# Patient Record
Sex: Female | Born: 1950 | Race: White | Hispanic: No | Marital: Married | Smoking: Never smoker
Health system: Southern US, Community
[De-identification: ages and names within clinical notes are randomized; demographics above are authoritative.]

## PROBLEM LIST (undated history)

## (undated) DIAGNOSIS — I4892 Unspecified atrial flutter: Secondary | ICD-10-CM

## (undated) DIAGNOSIS — G4752 REM sleep behavior disorder: Secondary | ICD-10-CM

## (undated) DIAGNOSIS — E6609 Other obesity due to excess calories: Secondary | ICD-10-CM

## (undated) DIAGNOSIS — E785 Hyperlipidemia, unspecified: Secondary | ICD-10-CM

## (undated) DIAGNOSIS — G4733 Obstructive sleep apnea (adult) (pediatric): Secondary | ICD-10-CM

## (undated) DIAGNOSIS — J310 Chronic rhinitis: Secondary | ICD-10-CM

## (undated) DIAGNOSIS — I700 Atherosclerosis of aorta: Secondary | ICD-10-CM

## (undated) DIAGNOSIS — M4800 Spinal stenosis, site unspecified: Secondary | ICD-10-CM

## (undated) DIAGNOSIS — R002 Palpitations: Secondary | ICD-10-CM

## (undated) DIAGNOSIS — F329 Major depressive disorder, single episode, unspecified: Secondary | ICD-10-CM

## (undated) DIAGNOSIS — E039 Hypothyroidism, unspecified: Secondary | ICD-10-CM

## (undated) DIAGNOSIS — F32A Anxiety disorder, unspecified: Secondary | ICD-10-CM

## (undated) DIAGNOSIS — G4761 Periodic limb movement disorder: Secondary | ICD-10-CM

## (undated) DIAGNOSIS — M797 Fibromyalgia: Secondary | ICD-10-CM

## (undated) DIAGNOSIS — F419 Anxiety disorder, unspecified: Secondary | ICD-10-CM

## (undated) DIAGNOSIS — R0602 Shortness of breath: Secondary | ICD-10-CM

## (undated) HISTORY — DX: Spinal stenosis, site unspecified: M48.00

## (undated) HISTORY — DX: Anxiety disorder, unspecified: F32.A

## (undated) HISTORY — DX: Anxiety disorder, unspecified: F41.9

## (undated) HISTORY — DX: Hypothyroidism, unspecified: E03.9

## (undated) HISTORY — PX: DILATION AND CURETTAGE OF UTERUS: SHX78

## (undated) HISTORY — DX: Other obesity due to excess calories: E66.09

## (undated) HISTORY — DX: Morbid (severe) obesity due to excess calories: E66.01

## (undated) HISTORY — DX: REM sleep behavior disorder: G47.52

## (undated) HISTORY — DX: Unspecified atrial flutter: I48.92

## (undated) HISTORY — DX: Chronic rhinitis: J31.0

## (undated) HISTORY — PX: OTHER SURGICAL HISTORY: SHX169

## (undated) HISTORY — DX: Periodic limb movement disorder: G47.61

## (undated) HISTORY — PX: CARPAL TUNNEL RELEASE: SHX101

## (undated) HISTORY — DX: Atherosclerosis of aorta: I70.0

## (undated) HISTORY — DX: Shortness of breath: R06.02

## (undated) HISTORY — DX: Fibromyalgia: M79.7

## (undated) HISTORY — DX: Obstructive sleep apnea (adult) (pediatric): G47.33

## (undated) HISTORY — DX: Hyperlipidemia, unspecified: E78.5

## (undated) HISTORY — DX: Major depressive disorder, single episode, unspecified: F32.9

## (undated) HISTORY — DX: Palpitations: R00.2

---

## 1998-01-09 ENCOUNTER — Other Ambulatory Visit: Admission: RE | Admit: 1998-01-09 | Discharge: 1998-01-09 | Payer: Self-pay | Admitting: Obstetrics and Gynecology

## 1998-01-27 ENCOUNTER — Encounter: Payer: Self-pay | Admitting: Obstetrics and Gynecology

## 1998-01-27 ENCOUNTER — Ambulatory Visit (HOSPITAL_COMMUNITY): Admission: RE | Admit: 1998-01-27 | Discharge: 1998-01-27 | Payer: Self-pay | Admitting: Obstetrics and Gynecology

## 1998-09-18 ENCOUNTER — Other Ambulatory Visit: Admission: RE | Admit: 1998-09-18 | Discharge: 1998-09-18 | Payer: Self-pay | Admitting: Obstetrics and Gynecology

## 1999-02-27 ENCOUNTER — Other Ambulatory Visit: Admission: RE | Admit: 1999-02-27 | Discharge: 1999-02-27 | Payer: Self-pay | Admitting: Obstetrics and Gynecology

## 1999-06-19 ENCOUNTER — Ambulatory Visit (HOSPITAL_COMMUNITY): Admission: RE | Admit: 1999-06-19 | Discharge: 1999-06-19 | Payer: Self-pay | Admitting: Specialist

## 1999-06-19 ENCOUNTER — Encounter: Payer: Self-pay | Admitting: Specialist

## 2000-01-29 ENCOUNTER — Encounter: Admission: RE | Admit: 2000-01-29 | Discharge: 2000-01-29 | Payer: Self-pay | Admitting: Obstetrics and Gynecology

## 2000-01-29 ENCOUNTER — Encounter: Payer: Self-pay | Admitting: Obstetrics and Gynecology

## 2000-02-05 ENCOUNTER — Encounter: Admission: RE | Admit: 2000-02-05 | Discharge: 2000-02-05 | Payer: Self-pay | Admitting: Obstetrics and Gynecology

## 2000-02-05 ENCOUNTER — Encounter: Payer: Self-pay | Admitting: Obstetrics and Gynecology

## 2000-03-11 ENCOUNTER — Other Ambulatory Visit: Admission: RE | Admit: 2000-03-11 | Discharge: 2000-03-11 | Payer: Self-pay | Admitting: Obstetrics and Gynecology

## 2000-08-19 ENCOUNTER — Ambulatory Visit (HOSPITAL_COMMUNITY): Admission: RE | Admit: 2000-08-19 | Discharge: 2000-08-19 | Payer: Self-pay | Admitting: Gastroenterology

## 2001-02-27 ENCOUNTER — Encounter: Payer: Self-pay | Admitting: Obstetrics and Gynecology

## 2001-02-27 ENCOUNTER — Encounter: Admission: RE | Admit: 2001-02-27 | Discharge: 2001-02-27 | Payer: Self-pay | Admitting: Obstetrics and Gynecology

## 2001-04-07 ENCOUNTER — Other Ambulatory Visit: Admission: RE | Admit: 2001-04-07 | Discharge: 2001-04-07 | Payer: Self-pay | Admitting: Obstetrics and Gynecology

## 2001-11-21 ENCOUNTER — Ambulatory Visit (HOSPITAL_BASED_OUTPATIENT_CLINIC_OR_DEPARTMENT_OTHER): Admission: RE | Admit: 2001-11-21 | Discharge: 2001-11-21 | Payer: Self-pay | Admitting: Orthopedic Surgery

## 2002-01-30 ENCOUNTER — Ambulatory Visit (HOSPITAL_BASED_OUTPATIENT_CLINIC_OR_DEPARTMENT_OTHER): Admission: RE | Admit: 2002-01-30 | Discharge: 2002-01-30 | Payer: Self-pay | Admitting: Orthopedic Surgery

## 2002-03-02 ENCOUNTER — Encounter: Admission: RE | Admit: 2002-03-02 | Discharge: 2002-03-02 | Payer: Self-pay | Admitting: Obstetrics and Gynecology

## 2002-03-02 ENCOUNTER — Encounter: Payer: Self-pay | Admitting: Obstetrics and Gynecology

## 2002-06-01 ENCOUNTER — Other Ambulatory Visit: Admission: RE | Admit: 2002-06-01 | Discharge: 2002-06-01 | Payer: Self-pay | Admitting: Obstetrics and Gynecology

## 2002-07-09 ENCOUNTER — Ambulatory Visit (HOSPITAL_BASED_OUTPATIENT_CLINIC_OR_DEPARTMENT_OTHER): Admission: RE | Admit: 2002-07-09 | Discharge: 2002-07-09 | Payer: Self-pay | Admitting: Orthopedic Surgery

## 2003-05-17 ENCOUNTER — Encounter: Admission: RE | Admit: 2003-05-17 | Discharge: 2003-05-17 | Payer: Self-pay | Admitting: Obstetrics and Gynecology

## 2003-06-07 ENCOUNTER — Other Ambulatory Visit: Admission: RE | Admit: 2003-06-07 | Discharge: 2003-06-07 | Payer: Self-pay | Admitting: Obstetrics and Gynecology

## 2003-07-26 ENCOUNTER — Emergency Department (HOSPITAL_COMMUNITY): Admission: EM | Admit: 2003-07-26 | Discharge: 2003-07-26 | Payer: Self-pay | Admitting: *Deleted

## 2004-07-10 ENCOUNTER — Encounter: Admission: RE | Admit: 2004-07-10 | Discharge: 2004-07-10 | Payer: Self-pay | Admitting: Obstetrics and Gynecology

## 2004-07-22 ENCOUNTER — Other Ambulatory Visit: Admission: RE | Admit: 2004-07-22 | Discharge: 2004-07-22 | Payer: Self-pay | Admitting: Obstetrics and Gynecology

## 2004-12-02 ENCOUNTER — Ambulatory Visit: Payer: Self-pay | Admitting: Internal Medicine

## 2004-12-30 ENCOUNTER — Ambulatory Visit: Payer: Self-pay | Admitting: Internal Medicine

## 2005-06-23 ENCOUNTER — Ambulatory Visit: Payer: Self-pay | Admitting: Internal Medicine

## 2005-07-12 ENCOUNTER — Ambulatory Visit (HOSPITAL_BASED_OUTPATIENT_CLINIC_OR_DEPARTMENT_OTHER): Admission: RE | Admit: 2005-07-12 | Discharge: 2005-07-12 | Payer: Self-pay | Admitting: Internal Medicine

## 2005-07-25 ENCOUNTER — Ambulatory Visit: Payer: Self-pay | Admitting: Internal Medicine

## 2005-07-27 ENCOUNTER — Ambulatory Visit: Payer: Self-pay | Admitting: Internal Medicine

## 2005-08-02 ENCOUNTER — Other Ambulatory Visit: Admission: RE | Admit: 2005-08-02 | Discharge: 2005-08-02 | Payer: Self-pay | Admitting: Obstetrics and Gynecology

## 2005-08-06 ENCOUNTER — Encounter: Admission: RE | Admit: 2005-08-06 | Discharge: 2005-08-06 | Payer: Self-pay | Admitting: Orthopaedic Surgery

## 2005-08-09 ENCOUNTER — Ambulatory Visit: Payer: Self-pay | Admitting: Internal Medicine

## 2005-08-30 ENCOUNTER — Encounter: Admission: RE | Admit: 2005-08-30 | Discharge: 2005-08-30 | Payer: Self-pay | Admitting: Obstetrics and Gynecology

## 2005-10-06 ENCOUNTER — Encounter: Admission: RE | Admit: 2005-10-06 | Discharge: 2005-11-02 | Payer: Self-pay | Admitting: Orthopaedic Surgery

## 2005-11-24 ENCOUNTER — Ambulatory Visit: Payer: Self-pay | Admitting: Internal Medicine

## 2006-02-09 ENCOUNTER — Ambulatory Visit: Payer: Self-pay | Admitting: Internal Medicine

## 2006-04-14 ENCOUNTER — Ambulatory Visit: Payer: Self-pay | Admitting: Internal Medicine

## 2006-08-16 ENCOUNTER — Ambulatory Visit (HOSPITAL_BASED_OUTPATIENT_CLINIC_OR_DEPARTMENT_OTHER): Admission: RE | Admit: 2006-08-16 | Discharge: 2006-08-16 | Payer: Self-pay | Admitting: Surgery

## 2006-08-17 ENCOUNTER — Encounter (INDEPENDENT_AMBULATORY_CARE_PROVIDER_SITE_OTHER): Payer: Self-pay | Admitting: Specialist

## 2006-09-06 ENCOUNTER — Encounter: Admission: RE | Admit: 2006-09-06 | Discharge: 2006-09-06 | Payer: Self-pay | Admitting: Obstetrics and Gynecology

## 2006-09-08 ENCOUNTER — Encounter: Admission: RE | Admit: 2006-09-08 | Discharge: 2006-09-08 | Payer: Self-pay | Admitting: Obstetrics and Gynecology

## 2006-10-14 ENCOUNTER — Ambulatory Visit: Payer: Self-pay | Admitting: Internal Medicine

## 2007-01-31 ENCOUNTER — Ambulatory Visit: Payer: Self-pay | Admitting: Pulmonary Disease

## 2007-02-10 DIAGNOSIS — G2589 Other specified extrapyramidal and movement disorders: Secondary | ICD-10-CM | POA: Insufficient documentation

## 2007-02-10 DIAGNOSIS — E669 Obesity, unspecified: Secondary | ICD-10-CM | POA: Insufficient documentation

## 2007-02-10 DIAGNOSIS — J310 Chronic rhinitis: Secondary | ICD-10-CM | POA: Insufficient documentation

## 2007-02-10 DIAGNOSIS — G4733 Obstructive sleep apnea (adult) (pediatric): Secondary | ICD-10-CM | POA: Insufficient documentation

## 2007-04-14 ENCOUNTER — Ambulatory Visit: Payer: Self-pay | Admitting: Internal Medicine

## 2007-04-14 DIAGNOSIS — E039 Hypothyroidism, unspecified: Secondary | ICD-10-CM | POA: Insufficient documentation

## 2007-04-15 DIAGNOSIS — G4752 REM sleep behavior disorder: Secondary | ICD-10-CM | POA: Insufficient documentation

## 2007-04-15 DIAGNOSIS — F341 Dysthymic disorder: Secondary | ICD-10-CM | POA: Insufficient documentation

## 2007-05-12 ENCOUNTER — Telehealth: Payer: Self-pay | Admitting: Internal Medicine

## 2007-07-27 ENCOUNTER — Encounter (INDEPENDENT_AMBULATORY_CARE_PROVIDER_SITE_OTHER): Payer: Self-pay | Admitting: Surgery

## 2007-07-27 ENCOUNTER — Ambulatory Visit (HOSPITAL_BASED_OUTPATIENT_CLINIC_OR_DEPARTMENT_OTHER): Admission: RE | Admit: 2007-07-27 | Discharge: 2007-07-27 | Payer: Self-pay | Admitting: Surgery

## 2007-09-26 ENCOUNTER — Encounter: Admission: RE | Admit: 2007-09-26 | Discharge: 2007-09-26 | Payer: Self-pay | Admitting: Obstetrics and Gynecology

## 2007-10-13 ENCOUNTER — Ambulatory Visit: Payer: Self-pay | Admitting: Internal Medicine

## 2007-10-31 ENCOUNTER — Telehealth (INDEPENDENT_AMBULATORY_CARE_PROVIDER_SITE_OTHER): Payer: Self-pay | Admitting: *Deleted

## 2008-01-26 ENCOUNTER — Ambulatory Visit: Payer: Self-pay | Admitting: Vascular Surgery

## 2008-04-11 ENCOUNTER — Ambulatory Visit: Payer: Self-pay | Admitting: Vascular Surgery

## 2008-04-12 ENCOUNTER — Ambulatory Visit: Payer: Self-pay | Admitting: Internal Medicine

## 2008-10-01 ENCOUNTER — Encounter: Admission: RE | Admit: 2008-10-01 | Discharge: 2008-10-01 | Payer: Self-pay | Admitting: Obstetrics and Gynecology

## 2008-10-09 ENCOUNTER — Ambulatory Visit: Payer: Self-pay | Admitting: Internal Medicine

## 2008-10-28 ENCOUNTER — Telehealth: Payer: Self-pay | Admitting: Internal Medicine

## 2009-01-01 DEATH — deceased

## 2009-02-10 ENCOUNTER — Ambulatory Visit: Payer: Self-pay | Admitting: Internal Medicine

## 2009-04-07 ENCOUNTER — Ambulatory Visit: Payer: Self-pay | Admitting: Internal Medicine

## 2009-05-03 HISTORY — PX: OTHER SURGICAL HISTORY: SHX169

## 2009-06-26 ENCOUNTER — Telehealth: Payer: Self-pay | Admitting: Internal Medicine

## 2009-10-06 ENCOUNTER — Encounter: Admission: RE | Admit: 2009-10-06 | Discharge: 2009-10-06 | Payer: Self-pay | Admitting: Obstetrics and Gynecology

## 2009-10-06 ENCOUNTER — Ambulatory Visit: Payer: Self-pay | Admitting: Internal Medicine

## 2009-11-04 ENCOUNTER — Telehealth: Payer: Self-pay | Admitting: Internal Medicine

## 2009-11-06 ENCOUNTER — Telehealth (INDEPENDENT_AMBULATORY_CARE_PROVIDER_SITE_OTHER): Payer: Self-pay | Admitting: *Deleted

## 2009-12-31 ENCOUNTER — Encounter: Admission: RE | Admit: 2009-12-31 | Discharge: 2009-12-31 | Payer: Self-pay | Admitting: Orthopaedic Surgery

## 2010-03-18 ENCOUNTER — Encounter (INDEPENDENT_AMBULATORY_CARE_PROVIDER_SITE_OTHER): Payer: Self-pay | Admitting: Obstetrics and Gynecology

## 2010-03-18 ENCOUNTER — Ambulatory Visit (HOSPITAL_COMMUNITY): Admission: RE | Admit: 2010-03-18 | Discharge: 2010-03-19 | Payer: Self-pay | Admitting: Obstetrics and Gynecology

## 2010-04-06 ENCOUNTER — Ambulatory Visit: Payer: Self-pay | Admitting: Internal Medicine

## 2010-06-02 NOTE — Progress Notes (Signed)
Summary: rx  Phone Note Call from Patient Call back at [phone]   Caller: Patient Call For: [name] [name] Reason for Call: Talk to Nurse Summary of Call: Nose Stuffy, Sneezing during night, lots of drainage.  Husband is in the hospital, can you call something in? [facility] [place] Initial call taken by: Eugene Gavia,  June 26, 2009 8:28 AM  Follow-up for Phone Call        Pt c/o sneezing and nasal congestion and PND since last night. Pt states her husband is in the hospital and she does not want to give him anything. Please advise. Carron Curie CMA  June 26, 2009 9:56 AM allergies: codeine, morphine  Additional Follow-up for Phone Call Additional follow up Details #1::        Offer z pak, but explain she may want to wear a dust and polen type mask- the nurses on the floor could give her one before she goes in his room. Additional Follow-up by: Waymon Budge MD,  June 26, 2009 11:58 AM   New Allergies: ! * Z PAK Additional Follow-up for Phone Call Additional follow up Details #2::    called pt to advise and she then tells me she cannot take a Zpak, she cannot remember why but sh ejust knows she cannot take it. I have added to her med list. Pelase advise on alternative. Carron Curie CMA  June 26, 2009 12:23 PM Allergies: Zpak, codeine, morphine  per dr Solomia Harrell offer doxycycline 100mg  2 today then 1 daily till gone  #8 Follow-up by: Philipp Deputy CMA,  June 26, 2009 2:48 PM  Additional Follow-up for Phone Call Additional follow up Details #3:: Details for Additional Follow-up Action Taken: called and spoke with pt and she is aware [facility] [place].   Randell Loop CMA  June 26, 2009 3:06 PM   New/Updated Medications: DOXYCYCLINE HYCLATE 100 MG TABS (DOXYCYCLINE HYCLATE) take 2 tablets by mouth today then one daily until gone. New Allergies: ! * Z PAKPrescriptions: DOXYCYCLINE HYCLATE 100 MG TABS (DOXYCYCLINE HYCLATE) take 2 tablets by mouth  today then one daily until gone.  #8 x 0   Entered by:   Randell Loop CMA   Authorized by:   Waymon Budge MD   Signed by:   Randell Loop CMA on 06/26/2009   Method used:   Electronically to        [facility]  [place] (retail)       [address]       [place], [place]  [address]       Ph: [phone] or [phone]       Fax: [phone]   RxID:   [id number]

## 2010-06-02 NOTE — Progress Notes (Signed)
Summary: infection in nose  Phone Note Call from Patient Call back at [phone]   Caller: Patient Call For: young Reason for Call: Talk to Nurse Summary of Call: pt requesting to speak to a nurse.  pt states that she wants to make sure that she does not have a" staff infection in her nose".  pt says when she eats she has" pains in her nose" symptoms started last friday.  symptoms started in her eye, pt used eyewash and her eye is better, but now she is worried about her nose Initial call taken by: hatley,melanie  Follow-up for Phone Call        pt using claritin otc for allergy type symptoms said if she felt she needed antibiotic or ov she would call us back-she wants to give it more time because she is not having symptoms everyday Follow-up by: Philipp Deputy CMA,  October 31, 2007 4:19 PM

## 2010-06-02 NOTE — Assessment & Plan Note (Signed)
Summary: 6 months/apc   Primary Provider/Referring Provider:  Jacky Kindle  CC:  6 month follow  up visit-OSA; no complaints..  History of Present Illness:  April 07, 2009- REM Behavior Disorder, OSA, rhinitis Feels sleep issues ok, but then talked extensively about mother's death and estate problems.  Husband has told her of three times since last here when she has been sleep walking- going to look out window. She cries out less in sleep and it is more likely to wake her. Her husband has recurrent lung cancer after resection. She continues Clonazepam 3 x 1 mg  and Vivactil 5 mg at HS. Dr Jacky Kindle added Xanax 0.5 mg three times a day as needed.  October 06, 2009- REM Behavior Disorder, OSA, rhinitis Compliant with CPAP , wearing 8 hours/ night. Has cried out in sleep only twice since last here. Sleeps alone now since March so she judges based on what she notices herself. Husband ill and sleeping in a recliner.  Stressfull day seems to be best predictor of crying out at night. Denies sleep walking and does not disturb the covers. Occasionally nocturia, back to sleep easily. She doesn't like sleeping alone so she puts bedtime off til tired. Mowed lawn- made her chest congested. Realizes she should wear mask. Took Zyrtec.  04-29-2010- REM Behavior Disorder, OSA, rhinitis.....grandaughter here CC- 6 month follow  up visit-OSA; no complaints. She continues CPAP at 10 and feels comfortable with it, but is now alone at night so can't report on snoring. Sleeps soundly using Clonazepam plus vivactil and denies daytime sleepiness. She thinks she is sleeping quietly. Daughter spent night once and said she was talking in sleep. Gets up once for bathroom with no problem.  Some incidental postnasal drip and sneezing- not bad.       Preventive Screening-Counseling & Management  Alcohol-Tobacco     Smoking Status: never  Current Medications (verified): 1)  Synthroid 175 Mcg Tabs (Levothyroxine  Sodium) .... Take 1 By Mouth Once Daily 2)  Clonazepam 0.5 Mg Tabs (Clonazepam) .... 3 By Mouth At Bedtime 3)  Vivactil 5 Mg  Tabs (Protriptyline Hcl) .Marland Kitchen.. 1 At Bedtime 4)  Bayer Low Strength 81 Mg  Tbec (Aspirin) .... Take 1 Tablet By Mouth Once A Day  Allergies (verified): 1)  ! Codeine 2)  ! Morphine 3)  ! 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 (His sister cares for his mother, Dessiree Sze).  Risk Factors: Smoking Status: never (04-29-10)  Past Surgical History: left rotator cuff bilateral carpal tunnel D&C Resection uterine polyp 2011  Social History: never smoked  Husband died-   was patient here for lung cancer- recurrent after surgery. (His sister cares for his mother, Lewis Grivas). Smoking Status:  never  Review of Systems      See HPI  The patient denies anorexia, fever, weight loss, weight gain, vision loss, decreased hearing, hoarseness, chest pain, syncope, dyspnea on exertion, peripheral edema, prolonged cough, headaches, hemoptysis, abdominal pain, severe indigestion/heartburn, abnormal bleeding, enlarged lymph nodes, and angioedema.  Vital Signs:  Patient profile:   60 year old female Height:      66 inches Weight:      281 pounds BMI:     45.52 O2 Sat:      99 % on Room air Pulse rate:   88 / minute BP sitting:   122 / 62  (left arm) Cuff size:   large  Vitals Entered By: Reynaldo Minium CMA (April 06, 2010  9:27 AM)  O2 Flow:  Room air CC: 6 month follow  up visit-OSA; no complaints.   Physical Exam  Additional Exam:  General: A/Ox3; pleasant and cooperative, NAD, overweight, talkative, calm  SKIN: no rash, lesions NODES: no lymphadenopathy HEENT: /AT, EOM- WNL, Conjuctivae- clear, PERRLA, TM-WNL, Nose- clear, Throat- clear and wnl, Mallampati IV, no stridor NECK: Supple w/ fair ROM, JVD- none, normal carotid impulses w/o bruits Thyroid- normal to palpation CHEST: Clear to P&A HEART: RRR, no m/g/r heard ABDOMEN: overweight AVW:UJWJ, nl pulses, no edema  NEURO: Grossly intact to observation      Impression & Recommendations:  Problem # 1:  OBSTRUCTIVE SLEEP APNEA (ICD-327.23)  Good CPAP compliance and control. Without diorect witnes since her husband died, but she was told before that she wasn't snoring through it and seemed top sleep more soundly.  Orders: Est. 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 It is best to continue but not increase present meds as discussed.   Orders: Est. Patient Level IV (82956)  Problem # 3:  RHINITIS (ICD-472.0) Minor rhinitis now without need to treat.   Medications Added to Medication List This Visit: 1)  Cpap 10 Advanced   Patient Instructions: 1)  Please schedule a follow-up appointment in 6 months. 2)  Refill clonazepam and vivactil 3)  Continue CPAP at 10 Prescriptions: VIVACTIL 5 MG  TABS (PROTRIPTYLINE HCL) 1 at bedtime  #60 Tablet x 5   Entered and Authorized by:   Waymon Budge MD   Signed by:   Waymon Budge MD on 04/06/2010   Method used:   Print then Give to Patient   RxID:   [id number] CLONAZEPAM 0.5 MG TABS (CLONAZEPAM) 3 by mouth at bedtime  #90 x 5   Entered and Authorized by:   Waymon Budge MD   Signed by:   Waymon Budge MD on 04/06/2010   Method used:   Print then Give to Patient    RxID:   [id number]

## 2010-06-02 NOTE — Progress Notes (Signed)
Summary: clonazepam refill  Phone Note Call from Patient   Reason for Call: Refill Medication Summary of Call: wants clonazapam refilled.  was told would be faxed.  it was not.   Initial call taken by: Storm Frisk MD,  May 12, 2007 7:30 PM  Follow-up for Phone Call        i phoned in 10   0.5mg  clonazapam  meds for weekend  dr Roxy Cedar team needs to handle this on monday wiht approp refill ov notes 12/08 state pt is on this for sleep disorder Follow-up by: Storm Frisk MD,  May 12, 2007 7:31 PM  Additional Follow-up for Phone Call Additional follow up Details #1::        Pt requesting refill on clonazepam.  Ordered chart.  Will attach msg to chart.  OK to refill? ..................................................................Marland KitchenCloyde Reams RN  May 15, 2007 10:42 AM     Additional Follow-up for Phone Call Additional follow up Details #2::    RX was called in by Renold Genta on 05-15-07. Follow-up by: Reynaldo Minium CMA,  May 15, 2007 11:01 AM    Prescriptions: CLONAZEPAM 0.5 MG  TABS (CLONAZEPAM) Take 2 tablets by mouth At bedtime  #10 x 0   Entered and Authorized by:   Storm Frisk MD   Signed by:   Storm Frisk MD on 05/12/2007   Method used:   [facility]phoned to ...       [facility]  [address]  [id number]       [address]       [place], [place]  [address]       Ph: [phone] or [phone]       Fax: [phone]   RxID:   [id number]

## 2010-06-02 NOTE — Assessment & Plan Note (Signed)
Summary: 6 months/apc   PCP:  Jacky Kindle  Chief Complaint:  6 month follow up visit.  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  Her husband tells her she is doing much better.  She will only occasionally talk in her sleep.  She feels poorly rested because of family stress with sick husband and demented mother.  She just finished Ceftin taken for sinusitis from her primary physician. Denies headache, sinus drainage, sneezing chest pain, dyspnea, n/v/d, weight loss, fever, edema.   04/12/08- Sleep-REM Behavior Disorder, OSA, rhinitis Discussed H1N1, wet nose- has nasonex Sleeping OK- discussed. Continues Vivactil 1 at lunch time daily, Clonazepam 1 at HS.- Told only occasionally cries out at night. Dreams about snakes.No motor activity or significant snoring.         Prior Medication List:  SYNTHROID 150 MCG  TABS (LEVOTHYROXINE SODIUM) Take 1 tablet by mouth once a day CLONAZEPAM 0.5 MG  TABS (CLONAZEPAM) Take 1 tablet by mouth At bedtime VIVACTIL 5 MG  TABS (PROTRIPTYLINE HCL) Take 1 tablet every day at noon BAYER LOW STRENGTH 81 MG  TBEC (ASPIRIN) Take 1 tablet by mouth once a day   Updated Prior Medication List: SYNTHROID 150 MCG  TABS (LEVOTHYROXINE SODIUM) Take 1 tablet by mouth once a day CLONAZEPAM 0.5 MG  TABS (CLONAZEPAM) Take 1 tablet by mouth At bedtime VIVACTIL 5 MG  TABS (PROTRIPTYLINE HCL) Take 1 tablet every day at noon BAYER LOW STRENGTH 81 MG  TBEC (ASPIRIN) Take 1 tablet by mouth once a day  Current Allergies (reviewed today): ! CODEINE ! 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        Husband followed here for lung cancer    Review of Systems      See HPI   Vital Signs:  Patient Profile:   60 Years Old Female Weight:      272.13 pounds O2 Sat:      100 % O2 treatment:    Room Air Pulse rate:   95 / minute BP sitting:   122 / 78  (left arm) Cuff size:   large  Vitals Entered By: Reynaldo Minium CMA (April 12, 2008 9:54 AM)             Comments Medications reviewed with patient Reynaldo Minium CMA  April 12, 2008 9:54 AM      Physical Exam  General: A/Ox3; pleasant and cooperative, NAD, overweight SKIN: no rash, lesions NODES: no lymphadenopathy HEENT: [place]/AT, EOM- WNL, Conjuctivae- clear, PERRLA, TM-WNL, Nose- clear, Throat- clear and wnl NECK: Supple w/ fair  ROM, JVD- none, normal carotid impulses w/o bruits Thyroid- normal to palpation CHEST: Clear to P&A HEART: RRR, no m/g/r heard ABDOMEN: Soft and nl; nml bowel sounds; no organomegaly or masses noted ZOX:WRUE, nl pulses, no edema  NEURO: Grossly intact to observation         Impression & Recommendations:  Problem # 1:  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  Problem # 2:  OBSTRUCTIVE SLEEP APNEA (ICD-327.23) Weight loss would help and was discussed again. Vivactil seems to stabilize upper airway some.   Other Orders: H1N1 vaccine (A5409) Influenza A (H1N1) w/ Phy couseling (W1191)   Patient Instructions: 1)  Please schedule a  follow-up appointment in 6 months. 2)  H1N1 Flu shot.    Flu Vaccine Consent Questions     Do you have a history of severe allergic reactions to this vaccine? no    Any prior history of allergic reactions to egg and/or gelatin? no    Do you have a sensitivity to the preservative Thimersol? no    Do you have a past history of Guillan-Barre Syndrome? no    Do you currently have an acute febrile illness? no    Have you ever had a severe reaction to latex? no    Vaccine information given and explained to patient? yes    Are you currently pregnant? no   Do you have Asthma? no   Lot Number: [id number]   Exp Date:10/30/2008   Site Given Left Deltoid Boone Master CNA  April 12, 2008 12:00 PM  ]

## 2010-06-02 NOTE — Assessment & Plan Note (Signed)
Summary: 6 MONTHS/APC   Visit Type:  Follow-up PCP:  Jacky Kindle  Chief Complaint:  6 month follow up.  History of Present Illness: OSA- didn't like the autotitration process but fine on cpap currently set on 10 cpap. REM Behavior almost resolved with cessation of Cymbalta by Dr. Jacky Kindle. Just talks a little in sleep occ. Also continues Clonazepam 0.5 mg x2 at hs and vivactil 5 mg at noon. Has not been able to lose weight. Denies sleepiness while driving.       This is a 60 year old woman who presents for follow-up Pulmonary visit.  Since the last visit, the patient feels their symptoms are improving. Very pleased with sleep status now.    Current Allergies: ! CODEINE ! MORPHINE     Vital Signs:  Patient Profile:   60 Years Old Female Weight:      277 pounds O2 Sat:      99 % Pulse rate:   96 / minute BP sitting:   122 / 64  (left arm) Cuff size:   regular  Vitals Entered By: Boone Master CNA (April 14, 2007 9:35 AM) Oxygen therapy Room Air             Comments Meds reviewed ..................................................................Marland KitchenBoone Master CNA  April 14, 2007 9:43 AM      Physical Exam  General:     obese.  Alert. appropriate Nose:     Thick white mucus bilaterally Mouth:     voice normal Neck:     no JVD.  No stridor Lungs:     clear bilaterally to auscultation and percussion Heart:     regular rate and rhythm, S1, S2 without murmurs, rubs, gallops, or clicks   Review of Systems       No headache, cofusion or syncope. Does feel stressed by family. 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 Patient Level III (17616)   Problem # 2:  RHINITIS (ICD-472.0) dry air management discussed  Problem # 3:  OBSTRUCTIVE SLEEP APNEA (ICD-327.23) continue cpap 10  Weight loss and driving safety emphasized Orders: Est. Patient Level III (07371)   Medications Added to Medication List This Visit: 1)  Clonazepam 0.5 Mg Tabs (Clonazepam) .... Take 2 tablets by mouth at bedtime 2)  Vivactil 5 Mg Tabs (Protriptyline hcl) .... Take 1 tablet every day at noon 3)  Bayer Low Strength 81 Mg Tbec (Aspirin) .... Take 1 tablet by mouth once a day   Patient Instructions: 1)  Please schedule a follow-up appointment in 6 months.   ]

## 2010-06-02 NOTE — Progress Notes (Signed)
Summary: clonazepam sent to the wrong pharmacy  Phone Note Call from Patient   Caller: Patient Call For: young Summary of Call: need prescript for clonazepam sent to pharmacy . it was [facility] [place] [address] [phone] Initial call taken by: Rickard Patience,  November 06, 2009 8:43 AM  Follow-up for Phone Call        Spoke with Aundra Millet at [facility] and cancelled prior RX for clonazepam. [facility] [place]. Will call RX to the correct pharmacy and pt is aware.Michel Bickers CMA  November 06, 2009 9:15 AM    Prescriptions: CLONAZEPAM 0.5 MG TABS (CLONAZEPAM) 3 by mouth at bedtime  #90 x 5   Entered by:   Michel Bickers CMA   Authorized by:   Waymon Budge MD   Signed by:   Michel Bickers CMA on 11/06/2009   Method used:   Telephoned to ...       [facility]  [place] [facility]ail)       [address]       [place], [place]  [address]       Ph: [phone] or [phone]       Fax: [phone]   RxID:   [id number]

## 2010-06-02 NOTE — Progress Notes (Signed)
Summary: clonazepam- clearify rx w/ pharmacy  Phone Note Call from Patient   Caller: Patient Call For: Macen Joslin Summary of Call: pt wants nurse to call and clearify the qty/ dosage for clonazepam. pt says her dosage was increased at last visit and she will run out since she has been taking 1 1/2 tabs of her remaining rx. pt did not take her rx in to the pharmacy but called it in. this is where the conflict began. [facility] [place] [phone]. pt's cell [phone] Initial call taken by: Tivis Ringer,  October 28, 2008 4:50 PM  Follow-up for Phone Call        Called spoke with pt.  Pt states current bottle of Clonazepam is for 1mg  tablets and she had previously been taking 1 at bedtime but since last OV incr dosage per CY to 1 1/2 tablets at bedtime.  Pt confused by rx given at OV for 0.5mg  tabs 1-2 by mouth at bedtime because this is the same or less # of med than pt is currently taking.  [facility] [place] last refill for Clonazepam 1mg  tablets auth by CY office on 2/10 with 5 refills.  Pt has been taking 1.5mg  Clonazepam at bedtime.  Is this OK?  Is this too much?  Please advise pt and provide new rx for appropriate dosage pt should be taking.  Thanks. Follow-up by: Cloyde Reams RN,  October 28, 2008 5:07 PM  Additional Follow-up for Phone Call Additional follow up Details #1::        Per CDY- okay to call in rx for clonazepam 0.5 mg #90- take 3 by mouth at bedtime with 5 refills.  [facility] [place].  Spoke with pt and made aware. Additional Follow-up by: Vernie Murders,  October 29, 2008 9:17 AM    New/Updated Medications: CLONAZEPAM 0.5 MG TABS (CLONAZEPAM) 3 by mouth at bedtime   Prescriptions: CLONAZEPAM 0.5 MG TABS (CLONAZEPAM) 3 by mouth at bedtime  #90 x 5   Entered by:   Vernie Murders   Authorized by:   Waymon Budge MD   Signed by:   Vernie Murders on 10/29/2008   Method used:   Telephoned to ...       [facility]  [place] (retail)       [address]  [place], [place]  [address]       Ph: [phone] or [phone]       Fax: [phone]   RxID:   [id number]

## 2010-06-02 NOTE — Assessment & Plan Note (Signed)
Summary: 6 months/apc   Primary Provider/Referring Provider:  Jacky Kindle  CC:  6 month follow up  and compliant with cpap averages 8 hrs per night.  History of Present Illness:  04/12/08- Sleep-REM Behavior Disorder, OSA, rhinitis Discussed H1N1, wet nose- has nasonex Sleeping OK- discussed. Continues Vivactil 1 at lunch time daily, Clonazepam 1 at HS.- Told only occasionally cries out at night. Dreams about snakes.No motor activity or significant snoring.  11-05-2008- Sleep- REM Behavior Disorder, OSA, rhinitis Husband says no change in her crying out at end of dreams around  4-6AM She has some awareness. Dreams are stressful. On Avelox for URI from her primary.   April 07, 2009- REM Behavior Disorder, OSA, rhinitis Feels sleep issues ok, but then talked extensively about mother's death and estate problems.  Husband has told her of three times since last here when she has been sleep walking- going to look out window. She cries out less in sleep and it is more likely to wake her. Her husband has recurrent lung cancer after resection. She continues Clonazepam 3 x 1 mg  and Vivactil 5 mg at HS. Dr Jacky Kindle added Xanax 0.5 mg three times a day as needed.  October 06, 2009- REM Behavior Disorder, OSA, rhinitis Compliant with CPAP , wearing 8 hours/ night. Has cried out in sleep only twice since last here. Sleeps alone now since March so she judges based on what she notices herself. Husband ill and sleeping in a recliner.  Stressfull day seems to be best predictor of crying out at night. Denies sleep walking and does not disturb the covers. Occasionally nocturia, back to sleep easily. She doesn't like sleeping alone so she puts bedtime off til tired. Mowed lawn- made her chest congested. Realizes she should wear mask. Took Zyrtec.   Current Medications (verified): 1)  Synthroid 175 Mcg Tabs (Levothyroxine Sodium) .... Take 1 By Mouth Once Daily 2)  Clonazepam 0.5 Mg Tabs (Clonazepam) .... 3 By Mouth  At Bedtime 3)  Vivactil 5 Mg  Tabs (Protriptyline Hcl) .... Take 1 Tablet Every Day At [facility] 4)  Bayer Low Strength 81 Mg  Tbec (Aspirin) .... Take 1 Tablet By Mouth Once A Day 5)  Alprazolam 0.5 Mg Tabs (Alprazolam) .... Take 1 By Mouth Three Times A Day As Needed 6)  Vimovo 500-20 Mg Tbec (Naproxen-Esomeprazole) .Marland Kitchen.. 1 Two Times A Day 7)  Robaxin 500 Mg Tabs (Methocarbamol) .Marland Kitchen.. 1 Once Daily  Allergies (verified): 1)  ! Codeine 2)  ! Morphine 3)  ! 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  Husband followed here for lung cancer- recurrent after surgery. (His sister cares for his mother, Hayes Rehfeldt).  Risk Factors: Smoking Status: quit (10/13/2007)  Review of Systems      See HPI  The patient denies shortness of breath with activity, shortness of breath at rest, productive cough, non-productive cough, coughing up blood, chest pain, irregular heartbeats, acid heartburn, indigestion, loss of appetite, weight change, abdominal pain, difficulty swallowing, sore throat, tooth/dental problems, headaches, nasal congestion/difficulty breathing through nose,  and sneezing.    Vital Signs:  Patient profile:   61 year old female Height:      66 inches Weight:      277 pounds BMI:     44.87 O2 Sat:      99 % on Room air Pulse rate:   91 / minute BP sitting:   124 / 78  (left arm) Cuff size:   large  Vitals Entered  By: Renold Genta RCP, LPN (October 07, 2954 9:42 AM)  O2 Sat at Rest %:  99% O2 Flow:  Room air CC: 6 month follow up , compliant with cpap averages 8 hrs per night Comments Medications reviewed with patient Renold Genta RCP, LPN  October 06, 2128 9:44 AM    Physical Exam  Additional Exam:  General: A/Ox3; pleasant and cooperative, NAD, overweight, talkative SKIN: no rash, lesions NODES: no lymphadenopathy HEENT: [place]/AT, EOM- WNL, Conjuctivae- clear, PERRLA, TM-WNL, Nose- clear, Throat- clear and wnl, Mallampati IV, no stridor NECK: Supple w/ fair ROM, JVD- none, normal carotid impulses w/o bruits Thyroid- normal to palpation CHEST: Clear to P&A HEART: RRR, no m/g/r heard ABDOMEN: overweight QMV:HQIO, nl pulses, no edema  NEURO: Grossly intact to observation      Impression & Recommendations:  Problem # 1:  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 She has a little anxiety about being alone at night with husband's repiratory illness. She will taper clonazepam gradually as tolerated rather than assuming she needs to stay on it indefinitely.  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 We discussed tapering rather than abruptly stopping. Legs ache- attributed by her primary to her fibromyalgia.  Problem # 3:  OBSTRUCTIVE SLEEP APNEA (ICD-327.23) Good compliance and control with CPAP  Problem # 4:  RHINITIS (ICD-472.0) Rhinitis and bronchitis occasionally symptomatic, mainly with outdoor exposures as noted.  Medications Added to Medication List This Visit: 1)  Vivactil 5 Mg Tabs (Protriptyline hcl) .Marland Kitchen.. 1 at bedtime 2)  Vimovo 500-20 Mg Tbec (Naproxen-esomeprazole) .Marland Kitchen.. 1 two times a day 3)  Robaxin 500 Mg Tabs (Methocarbamol) .Marland Kitchen.. 1 once daily  Other Orders: Est. Patient Level IV (96295)  Patient Instructions: 1)  Please schedule a follow-up appointment  in 6 months. 2)  OK to continue Vivactil with clonazepam at night. Prescriptions: VIVACTIL 5 MG  TABS (PROTRIPTYLINE HCL) 1 at bedtime  #30 x prn   Entered and Authorized by:   Waymon Budge MD   Signed by:   Waymon Budge MD on 10/06/2009   Method used:   Historical   RxID:   [id number]

## 2010-06-02 NOTE — Progress Notes (Signed)
Summary: Clonazepam Refill  Phone Note Refill Request Message from:  Fax from Pharmacy on November 04, 2009 3:36 PM   Dr. Maple Hudson patient  Refills Requested: Medication #1:  CLONAZEPAM 0.5 MG TABS 3 by mouth at bedtime   Dosage confirmed as above?Dosage Confirmed   Brand Name Necessary? No   Supply Requested: 1 month   Last Refilled: 09/21/2009   Notes: Last seen by Dr. Maple Hudson on 10/06/2009  Method Requested: Telephone to Pharmacy Initial call taken by: Michel Bickers CMA,  November 04, 2009 3:37 PM  Follow-up for Phone Call        Please advise if this is okay to fill?Michel Bickers CMA  November 04, 2009 3:38 PM  Additional Follow-up for Phone Call Additional follow up Details #1::        ok to refill x 5  Additional Follow-up by: Waymon Budge MD,  November 05, 2009 8:46 AM    Prescriptions: CLONAZEPAM 0.5 MG TABS (CLONAZEPAM) 3 by mouth at bedtime  #90 x 5   Entered by:   Randell Loop CMA   Authorized by:   Waymon Budge MD   Signed by:   Randell Loop CMA on 11/05/2009   Method used:   Telephoned to ...       [facility] [id number]* (retail)       [address] [place], [place]  [address]       Ph: [phone]       Fax: [phone]   RxID:   [id number]

## 2010-06-02 NOTE — Assessment & Plan Note (Signed)
Summary: flu shot/apc  Nurse Visit   Allergies: 1)  ! Codeine 2)  ! Morphine  Orders Added: 1)  Admin 1st Vaccine [90471] 2)  Flu Vaccine 2yrs + [04540]  Flu Vaccine Consent Questions     Do you have a history of severe allergic reactions to this vaccine? no    Any prior history of allergic reactions to egg and/or gelatin? no    Do you have a sensitivity to the preservative Thimersol? no    Do you have a past history of Guillan-Barre Syndrome? no    Do you currently have an acute febrile illness? no    Have you ever had a severe reaction to latex? no    Vaccine information given and explained to patient? yes    Are you currently pregnant? no    Lot Number:[id number]   Exp Date:10/30/2009   Site Given  Left Deltoid IM  Tammy Scott  February 10, 2009 4:48 PM

## 2010-07-14 LAB — CBC
HCT: 39.3 % (ref 36.0–46.0)
HCT: 40.8 % (ref 36.0–46.0)
Hemoglobin: 13.4 g/dL (ref 12.0–15.0)
Hemoglobin: 13.7 g/dL (ref 12.0–15.0)
MCH: 30.6 pg (ref 26.0–34.0)
MCH: 31 pg (ref 26.0–34.0)
MCHC: 33.5 g/dL (ref 30.0–36.0)
MCHC: 34 g/dL (ref 30.0–36.0)
MCV: 91 fL (ref 78.0–100.0)
MCV: 91.3 fL (ref 78.0–100.0)
Platelets: 222 10*3/uL (ref 150–400)
Platelets: 227 10*3/uL (ref 150–400)
RBC: 4.32 MIL/uL (ref 3.87–5.11)
RBC: 4.47 MIL/uL (ref 3.87–5.11)
RDW: 13.4 % (ref 11.5–15.5)
RDW: 13.4 % (ref 11.5–15.5)
WBC: 10 10*3/uL (ref 4.0–10.5)
WBC: 5 10*3/uL (ref 4.0–10.5)

## 2010-07-14 LAB — DIFFERENTIAL
Basophils Absolute: 0 10*3/uL (ref 0.0–0.1)
Basophils Relative: 1 % (ref 0–1)
Eosinophils Absolute: 0.1 10*3/uL (ref 0.0–0.7)
Eosinophils Relative: 3 % (ref 0–5)
Lymphocytes Relative: 25 % (ref 12–46)
Lymphs Abs: 1.3 10*3/uL (ref 0.7–4.0)
Monocytes Absolute: 0.5 10*3/uL (ref 0.1–1.0)
Monocytes Relative: 10 % (ref 3–12)
Neutro Abs: 3.1 10*3/uL (ref 1.7–7.7)
Neutrophils Relative %: 62 % (ref 43–77)

## 2010-08-31 ENCOUNTER — Other Ambulatory Visit: Payer: Self-pay | Admitting: Obstetrics and Gynecology

## 2010-08-31 DIAGNOSIS — Z1231 Encounter for screening mammogram for malignant neoplasm of breast: Secondary | ICD-10-CM

## 2010-09-15 NOTE — Assessment & Plan Note (Signed)
[facility]                             PULMONARY OFFICE NOTE   NAME:[name], [name] PELLEY                       MRN:          [id number]  DATE:10/14/2006                            DOB:          May 18, 1950    PROBLEMS:  1. Obstructive sleep apnea.  2. Rapid eye movement behavior disorder.  3. Periodic limb movement with arousal.  4. Exogenous obesity.  5. Rhinitis.   HISTORY:  She is now settled into using CPAP regularly at 8 CWP and says  that she would not try to sleep without it. Rarely, has her physical  activity problems at night anymore with little yelling or movement. That  has just happened a few times. She is using Vivactil 10 mg at noon, and  Clonazepam 0.5 mg nightly. This seems to be a good regimen and is well  tolerated. She mentions a bothersome persistent post-nasal drip over the  last several weeks and we discussed air quality, allergy, and related  complaints. She does not recognize reflux or headache.   MEDICATIONS:  1. Synthroid 150 mcg.  2. Clonazepam 0.5 mg nightly.  3. Calcium with vitamin D.  4. CPAP at 8 CWP.  5. Cymbalta 60 mg.  6. Vivactil 10 mg at noon.   Drug intolerant MORPHINE and CODEINE.   OBJECTIVE:  Weight 244 pounds, blood pressure 114/68, pulse 87, room air  saturation 99%. She was pleasant and conversational. Neurologic was  unremarkable to observation with no tremor or unusual movement. Nasal  airway was clear and she was breathing and speaking comfortably. Lungs  were quiet and clear. Heart sounds regular and normal.   IMPRESSION:  1. Obstructive sleep apnea controlled with CPAP at 8 CWP.  2. REM behavior disorder, minimized partly because CPAP prevents      respiratory related waking in REM, but also her REM suppressing and      muscle sedating therapies are effective.  3. Recent rhinitis, non-specific.   PLAN:  1. She will continue her CPAP, Vivactil, and Clonazepam.  2. Try Claritin, specifically  because it is unlikely to get into the      brain to complicate her other      therapies.  3. Schedule return 6 months, earlier p.r.n.     Clinton D. Maple Hudson, MD, Tonny Bollman, FACP  Electronically Signed    CDY/MedQ  DD: 10/15/2006  DT: 10/16/2006  Job #: [id number]   cc:   Geoffry Paradise, M.D.

## 2010-09-15 NOTE — Consult Note (Signed)
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  I have seen her in prior discussion for venous  telangiectasia in 2005.  She has had progression since that time.  She  does not have any history of deep vein thrombosis or superficial  thrombophlebitis.  She has had at least 4 different episodes of  bleeding, most recently several weeks ago from this area of her right  lateral knee.  She reports this mainly occurs with shaving.   FAMILY HISTORY:  Significant for varicosities in her mother.   Her medical problems are only of hypothyroidism.  She has intolerance to  morphine.   MEDICATIONS:  Synthroid, clonazepam, Vivactil 5 mg, baby aspirin.   She does not smoke cigarettes.   PRIOR SURGICAL HISTORY:  Carpal tunnel treatment bilaterally.  Also left  shoulder rotator cuff repair.   PHYSICAL EXAM:  Well-developed, well-nourished white female appearing  stated age of 18.  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  She does not have any  varicose veins.  She does have some reticular varicosities more  prominent in her right lateral knee area.   She underwent venous duplex in our office.  This does show some reflux  in her deep system.  She does have multiple branched saphenous veins in  both legs with no dominant definite trunk.  On the area of bleeding,  there are multiple reticular veins under the skin feeding spider veins  on the surface, which have bled.  I discussed options with the patient.  I do not see any correctable venous hypertension, and explained that she  would not be a candidate for laser ablation or stripping.  Due to the  recurrent bleeding from the same area of these  reticular varicosities in  her right lateral knee, I explained that we could hopefully reduce her  risk for rebleeding by treating these reticular veins with  sclerotherapy.  She wishes to proceed with this treatment.  I explained  that, although compression garments are not indicated for this,  frequently, that her insurance carrier would require a 31-month trial of  compression garments to see if this would prevent further bleeding.  She  is having continued difficulty with this and wishes to proceed with  sclerotherapy.  She wishes to personally consult the insurance carrier  to determine if this can be approved without a 58-month trial of  compression garments.  We will discuss this with her once she discusses  this with her carrier.   Larina Earthly, M.D.  Electronically Signed   TFE/MEDQ  D:  01/26/2008  T:  01/29/2008  Job:  [id number]

## 2010-09-15 NOTE — Op Note (Signed)
NAMESHAKINAH, [name] [name]                ACCOUNT NO.:  [id number]   MEDICAL RECORD NO.:  [id number]          PATIENT TYPE:  AMB   LOCATION:  DSC                          FACILITY:  [facility]   PHYSICIAN:  Sandria Bales. Ezzard Standing, M.D.  DATE OF BIRTH:  November 04, 1950   DATE OF PROCEDURE:  07/27/2007  DATE OF DISCHARGE:                               OPERATIVE REPORT   Operative date here ???   PREOPERATIVE DIAGNOSIS:  Draining sinus right thigh approximately 1.5 cm  and papilloma right chest wall.   POSTOPERATIVE DIAGNOSIS:  Draining sinus right thigh approximately 1.5  cm and papilloma right chest wall.   PROCEDURE:  Wide excision of draining sinus right thigh and excision of  papilloma.   SURGEON:  Sandria Bales. Ezzard Standing, M.D.   ANESTHESIA:  Approximately 23 mL of 1% Xylocaine with epinephrine.   COMPLICATIONS:  None.   INDICATIONS FOR PROCEDURE:  The patient is a 60 year old white female  who has had an infected lesion on her right thigh and this has been  chronically draining.  This has actually been excised once before, so  she has come back for a wider excision of this area.  She also has a  papilloma of the right chest wall.   The indications and potential complications of the procedure were  explained to the patient.  Potential complications include but not  limited to bleeding, infection, and recurrence of the infection.   DESCRIPTION OF PROCEDURE:  The right thigh and right chest wall were  prepped with Betadine solution and sterilely draped.  I infiltrated the  right thigh primarily with about 22 mL of 1% Xylocaine with epinephrine  and the right chest wall with about 1 mL of 2% plain.   I then excised the right thigh and because she had trouble before with  subcuticular sutures, this time I placed 3-0 nylon sutures that I could  then removed.  I felt I got widely around the draining sinus tract into  the subcutaneous fat.   On the right chest wall I excised the little papilloma and burned  the  base with a silver nitrate stick.  The right thigh was closed with 3-0  nylon suture.  Each wound was sterilely dressed and the patient was seen  back in two weeks for suture removal of the right leg.      Sandria Bales. Ezzard Standing, M.D.  Electronically Signed     DHN/MEDQ  D:  07/27/2007  T:  07/27/2007  Job:  [id number]   cc:   Geoffry Paradise, M.D.

## 2010-09-15 NOTE — Procedures (Signed)
LOWER EXTREMITY VENOUS REFLUX EXAM   INDICATION:  Bilateral varicose veins.   EXAM:  Using color-flow imaging and pulse Doppler spectral analysis, the  right and left common femoral, superficial femoral, popliteal, posterior  tibial, greater and lesser saphenous veins are evaluated.  There is  evidence suggesting deep venous insufficiency in the right and left  lower extremities.   The right and left saphenofemoral junctions are not competent.  The left  GSV is not competent with caliber as described below.  The right is  competent.   The right and left proximal short saphenous vein demonstrates  competency, however, the right shows evidence of possible chronic  thrombus versus wall thickening.   GSV Diameter (used if found to be incompetent only)                                            Right    Left  Proximal Greater Saphenous Vein           cm       0.73 cm  Proximal-to-mid-thigh                     cm       cm  Mid thigh                                 cm       0.45 cm  Mid-distal thigh                          cm       cm  Distal thigh                              cm       0.45 cm  Knee                                      cm       0.27 cm   IMPRESSION:  1. Left greater saphenous vein reflux is identified with the caliber      ranging from 0.27 cm to 0.73 cm knee to groin.  2. The right and left greater saphenous veins are not aneurysmal.  3. The right and left greater saphenous veins are somewhat tortuous      with numerous branches.  4. The deep venous system is not competent.  5. The right and left lesser saphenous veins are competent, however,      the right shows evidence of possible chronic thrombus versus wall      thickening.      ___________________________________________  Larina Earthly, M.D.   AS/MEDQ  D:  01/26/2008  T:  01/27/2008  Job:  [id number]

## 2010-09-18 NOTE — Assessment & Plan Note (Signed)
[facility]                             PULMONARY OFFICE NOTE   NAME:[name], [name] BAYSINGER                       MRN:          [id number]  DATE:04/14/2006                            DOB:          January 19, 1951    PROBLEM LIST:  1. Obstructive sleep apnea.  2. Rapid eye movement, behavior disorder.  3. Periodic limb movement with arousal.  4. Exogenous obesity.  5. Rhinitis.   HISTORY OF PRESENT ILLNESS:  She has lost almost 20 pounds since last  year. She feels that she is doing very well now. After 2 to 3 weeks of  trying Vivactil at 10 mg, taken at noon time, she began sleeping better  and she says now, she only talks in her sleep 2 or 3 times a month with  no dream recall. Her sleep quality is much better and she feels much  better rested during the day. She has stopped using her CPAP and is not  being told of snoring.   MEDICATIONS:  1. Synthroid 140 mcg.  2. Clonazepam 0.5 mg at bedtime.  3. Calcium with vitamin D.  4. Cymbalta 60 mg.  5. Vivactil 10 mg at noon.   ALLERGIES:  MORPHINE, CODEINE.   OBJECTIVE:  VITAL SIGNS:  Weight 251 pounds, compared with 270 pounds in  October. Blood pressure 120/70, pulse regular at 76. Room air saturation  100%.  GENERAL:  She still is obese. She is very alert and looks comfortable.  HEART:  Sounds are regular without murmur or gallop.  NEUROLOGIC:  Pulses regular. There is no restlessness or tremor.   IMPRESSION:  1. Obstructive sleep apnea, mild, helped substantially by weight loss.      The Vivactil at noon may be helping and certainly is not hurting.      She may no longer need CPAP, given her weight loss and seems to be      doing comfortably well without it, so we will continue as we are      doing.  2. The sleep disorder movement, interpreted as a combination of REM      behavior disorder and periodic limb movement, also seems to be      doing better. I suspect having somewhat less stress at home  may be      important as well.   PLAN:  She will remain off the CPAP and continue to work on weight loss.  Schedule return in 6 months, earlier p.r.n.     Clinton D. Maple Hudson, MD, Tonny Bollman, FACP  Electronically Signed    CDY/MedQ  DD: 04/16/2006  DT: 04/16/2006  Job #: [id number]   cc:   Geoffry Paradise, M.D.

## 2010-09-18 NOTE — Procedures (Signed)
NAMEMarland [name]  PATRICIAANN, RABANAL                ACCOUNT NO.:  [id number]   MEDICAL RECORD NO.:  [id number]          PATIENT TYPE:  OUT   LOCATION:  SLEEP CENTER                 FACILITY:  [facility]   PHYSICIAN:  Clinton D. Maple Hudson, M.D. DATE OF BIRTH:  Oct 25, 1950   DATE OF STUDY:  07/12/2005                              NOCTURNAL POLYSOMNOGRAM   REFERRING PHYSICIAN:  Dr. Jetty Duhamel.   DATE OF STUDY:  July 12, 2005.   INDICATION FOR STUDY:  Insomnia with sleep apnea.  REM behavior disorder.  Epworth sleepiness score 3/24, BMI 40.7, weight 260 pounds.  Home  medication:  Levothyroxine, magnesium, calcium,  Requip, clonazepam.   SLEEP ARCHITECTURE:  Total sleep time 421 minutes with sleep efficiency 89%.  Stage I 3%, stage II 50%, stages III and IV 9%, REM 39% of total sleep time.  Sleep latency 16 minutes, REM latency 55 minutes, awake after sleep onset 40  minutes, arousal index 21.6 indicating mildly increased fragmentation.   RESPIRATORY DATA:  Apnea-hypopnea index (AHI, RDI) 16.7 obstructive events  per hour indicating mild to moderate obstructive sleep apnea/hypopnea  syndrome.  This reflected 4 obstructive apneas and 113 hypopneas.  Most  sleep and therefore most events were recorded while supine but significant  numbers were also seen while sleeping on her left side.  REM AHI 39.5 per  hour.  She did not have enough early events to permit CPAP titration by  split protocol.   OXYGEN DATA:  Mild to moderate snoring with oxygen desaturation to a nadir  of 81%.  Mean oxygen saturation through the study was 97% on room air.   CARDIAC DATA:  Normal sinus rhythm with PVCs.   MOVEMENT/PARASOMNIA:  A total of 512 limb jerks were recorded of which 37  were associated with arousal or awakening for a periodic limb movement with  arousal index of 5.3 per hour.  Verbalization, moaning and laughing were  heard during very active REM with arousals and limb jerks.   IMPRESSION/RECOMMENDATION:  1.  Mild  to moderate obstructive sleep apnea/hypopnea syndrome, apnea-      hypopnea index 16.7 per hour with events predominantly while in rapid      eye movement (apnea-hypopnea index 39.5 per hour) and supine.  Mild to      moderate snoring with oxygen desaturation to a nadir of 81%.  2.  Periodic limb movement with arousal, 5.3 per hour.  3.  Rapid eye movement behavior disorder syndrome with very active rapid eye      movement including limb movements, verbalization, moaning, and laughing.  4.  Note that the patient had taken clonazepam at bedtime.  Therapeutic      options will be determined at clinical followup.      Clinton D. Maple Hudson, M.D.  Diplomate, Biomedical engineer of Sleep Medicine  Electronically Signed     CDY/MEDQ  D:  07/25/2005 10:07:08  T:  07/26/2005 23:05:26  Job:  [id number]

## 2010-09-18 NOTE — Op Note (Signed)
[name], [name]                ACCOUNT NO.:  [id number]   MEDICAL RECORD NO.:  [id number]          PATIENT TYPE:  AMB   LOCATION:  DSC                          FACILITY:  [facility]   PHYSICIAN:  Sandria Bales. Ezzard Standing, M.D.  DATE OF BIRTH:  07-24-1950   DATE OF PROCEDURE:  08/16/2006  DATE OF DISCHARGE:                               OPERATIVE REPORT   PREOPERATIVE DIAGNOSIS:  Infected lesion, right anterior thigh.   POSTOPERATIVE DIAGNOSIS:  Infected lesion, right anterior thigh.   PROCEDURE:  Excision of infected lesion, right anterior thigh.   SURGEON:  Sandria Bales. Ezzard Standing, M.D.   ANESTHESIA:  Approximately 23 mL of 1% Xylocaine with epinephrine.   COMPLICATIONS:  None.   INDICATIONS FOR PROCEDURE:  [name] had presented with a mass on the  right anterior thigh which has been infected.  This has been going on  for several weeks.  She has been through a couple of courses of  antibiotics without any improvement.  Interestingly, she has a history  of being on a diet where they actually injected her with B12 and HCG  and, even though she is not sure of the specific injection site, this is  close to where she was injected.  It is unclear to me whether this  represents an infected sebaceous cyst versus some infection from the  injection site or some other cause.  She now comes in for excision of  this area.  I discussed with her the indications and potential  complications.  The potential complications include bleeding, infection,  which she may already have, recurrence in this area.  I did put her on  Keflex yesterday as an oral antibiotic, I gave her five days worth until  this is excised.   DESCRIPTION OF PROCEDURE:  The patient was placed in the supine  position.  Her right thigh was prepped with Betadine solution and  sterilely draped.  I excised an area approximately 4.5 cm in length  cutting out indurated subcutaneous tissue.  I got down below where the  tissue was soft and on both  sides.  She did have one vein running  through it, but there was no obvious cyst cavity that I could see.  There was a little pocket of pus that I thought I got  into.  I then irrigated with saline and closed it with 3-0 chromic  sutures and then a 5-0 Vicryl suture.  I painted the wound with tincture  of Benzoin and placed Steri-Strips.   She will be seen back in ten days to two weeks.  She is to keep it dry  for 48 hours.  Call for any interval problem.      Sandria Bales. Ezzard Standing, M.D.  Electronically Signed     DHN/MEDQ  D:  08/16/2006  T:  08/16/2006  Job:  [id number]   cc:   Eber Hong  Fax: [phone]

## 2010-09-18 NOTE — Procedures (Signed)
[place]. [facility]  Patient:    [name] [name], [name] [name]                       MRN: [id number] Proc. Date: 08/19/00 Adm. Date:  [id number] Disc. Date: [id number] Attending:  Rich Brave CC:         Juluis Mire, M.D.  [phone]   Procedure Report  PROCEDURE PERFORMED:  Colonoscopy.  ENDOSCOPIST:  Florencia Reasons, M.D.  INDICATIONS FOR PROCEDURE:  Screening for colon cancer in an asymptomatic 60 year old with a history of mild irritable bowel syndrome.  FINDINGS:  Normal exam to the cecum.  DESCRIPTION OF PROCEDURE:  The nature, purpose and risks of the procedure had been discussed with the patient, who provided written consent.  Sedation was fentanyl 100 mcg and Versed 10 mg IV without arrhythmias or desaturation.  The Olympus adjustable tension pediatric video colonoscope was advanced quite easily to the cecum with some looping (adult scope may work better in the future).  The cecum was identified by visualization of the appendiceal orifice and pullback was then performed.  A modified prep had been used for this procedure due to anticipated intolerance of the standard prep.  Even the Fleets Phospho-Soda was not tolerated by the patient, so she had received multiple doses of milk of magnesia and also received to Fleet enemas upon arrival at the endo unit.  The quality of the prep was quite good and was able to be made excellent by irrigation with water during the course of the exam.  Therefore, it is felt that all areas were well seen.  This was a normal examination.  No polyps, cancer, colitis, vascular malformations or diverticular disease were seen and retroflexion in the rectum was normal.  No biopsies were obtained.  The patient tolerated the procedure well and there were no apparent complications.  IMPRESSION:  Normal colonoscopy to the cecum.  PLAN: Consider routine screening with flexible sigmoidoscopy in five years, perhaps to be  followed by full colonoscopy five years thereafter.  DD: 08/19/00 TD:  08/22/00 Job: [id number]

## 2010-09-18 NOTE — Op Note (Signed)
NAME:  [name] [name], [name] [name]                          ACCOUNT NO.:  [id number]   MEDICAL RECORD NO.:  [id number]                   PATIENT TYPE:  AMB   LOCATION:  DSC                                  FACILITY:  [facility]   PHYSICIAN:  Robert A. Thurston Hole, M.D.              DATE OF BIRTH:  02/14/1951   DATE OF PROCEDURE:  07/09/2002  DATE OF DISCHARGE:                                 OPERATIVE REPORT   PREOPERATIVE DIAGNOSIS:  Left shoulder partial rotator cuff tear with  impingement.   POSTOPERATIVE DIAGNOSES:  1. Left shoulder rotator cuff tear.  2. Left shoulder partial glenoid labrum tear.  3. Left shoulder impingement.   PROCEDURES:  1. Left shoulder examination under anesthesia followed by arthroscopic     partial labrum tear debridement.  2. Left shoulder arthroscopically assisted  rotator cuff repair using     Arthrex suture anchor x1.  3. Left shoulder subacromial decompression.   SURGEON:  Elana Alm. Thurston Hole, M.D.   ASSISTANT:  Julien Girt, P.A.   ANESTHESIA:  General anesthesia.   OPERATIVE TIME:  One hour.   COMPLICATIONS:  None.   INDICATIONS FOR PROCEDURE:  The patient is a 60 year old woman who has had  significant left shoulder pain for the past three months, increasing in  nature with signs and symptoms and MRI documenting rotator cuff tendinitis  and impingement who has failed conservative care and is now to undergo  arthroscopy.   DESCRIPTION OF PROCEDURE:  The patient was brought to the operating room on  July 09, 2002, after an interscalene block had been placed in the holding  room by anesthesia.  She was placed on the operating table in the supine  position.  After being placed under general anesthesia, her left shoulder  was examined under anesthesia. She had full range of motion in her shoulder  with stable ligamentous examination.  She was then placed in beach chair  position and her shoulder and arm were prepped using sterile DuraPrep and  draped  using sterile technique.  Originally through the posterior  arthroscopic portal, the arthroscope with a pump attachment was placed and  through an anterior portal an arthroscopic probe was placed.  On initial  inspection, the articular cartilage and the glenohumeral joint was intact.  Anterior labrum partial tearing 25% which was debrided.  Inferior labrum and  anterior inferior glenohumeral ligament complex was intact.  Superior labrum  biceps tendon anchor was intact.  Biceps tendon was intact.  Posterior  labrum was intact.  Rotator cuff had a small but complete tear of the  supraspinatus which was partially debrided arthroscopically.  The rest of  the rotator cuff was intact.  The inferior capsular recess was free of  pathology.  The subacromial space was entered and a lateral arthroscopic  portal was made.  Moderate bursitis was resected.  Subacromial decompression  was carried out removing 6 to 8 mm  of the undersurface of the anterior,  anterolateral and anterior medial acromion and CA ligament release was  carried out as well.  The [facility] joint was not disturbed.  The rotator cuff tear  was easily visualized and this was further debrided arthroscopically.  Through a separate portal, an Arthrex suture anchor was placed in the  greater tuberosity.  Each of these sutures were passed through the rotator  cuff tear and tied arthroscopically, thus repairing the tear back down to  the greater tuberosity.  After this was done, the shoulder could be brought  through a full range of motion with no impingement on the repair.  At this  point, the instruments were removed.  Portals closed with 3-0 nylon sutures,  sterile dressings and a sling applied and the patient awakened and taken to  the recovery room in stable condition.   FOLLOW UP CARE:  The patient is to be followed as an outpatient.  She will  receive Percocet for pain.  I will see her back in the office in a week for  sutures out and  follow-up.  Begin early physical therapy for passive range  of motion only x6 weeks and then active thereafter.                                               Robert A. Thurston Hole, M.D.    RAW/MEDQ  D:  07/09/2002  T:  07/09/2002  Job:  [id number]

## 2010-09-18 NOTE — Assessment & Plan Note (Signed)
[facility]                               PULMONARY OFFICE NOTE   NAME:[name] [name], [name] [name]                       MRN:          [id number]  DATE:11/24/2005                            DOB:          [birth year]    PROBLEMS:  1.  Obstructive sleep apnea.  2.  Rapid eye movement, behavior disorder.  3.  Periodic limb movement with arousable.  4.  Exogenous obesity.  5.  Rhinitis.   HISTORY:  She is using CPAP every night at 8 CWP.  When she first puts it  on, she feels a little air hungry at first, but quickly adjusts.  We talked  about whether to increase pressure and decided to leave it.  She came off of  Cymbalta for awhile, got very emotional and resumed the Cymbalta.  During  this change, she reduced Clonazepam from 1 mg to 0.5 mg with no impact on  her vivid dreams, screaming and night terrors.  She does feel she needs at  least the 0.5 mg dose.  She came off of imipramine, not recalling much about  that.  It had been given for sedation and dream suppression.  She describes  considerable stress related to health problems in the family.  Her husband  is followed here for pulmonary disease and sleep apnea and her mother-in-law  is followed here for COPD.   MEDICATIONS:  1.  Synthroid 150 mg.  2.  Clonazepam 0.5 mg nightly.  3.  CPAP 8 CWP.  4.  Cymbalta 60 mg.   ALLERGIES:  MORPHINE AND CODEINE.   I asked what she wanted to do about medication and side-effect choices.  She  is aware of the vivid dreams, but says in particular, she does not want to  scare others by screaming in her sleep.   OBJECTIVE:  VITAL SIGNS:  Weight 274 pounds, BP 110/72, pulse regular and  78, room air saturation 99%.  GENERAL:  This is a very overweight, pleasantly alert woman with clear  speech.  No unusual movement or restlessness.  HEENT:  There are no pressure marks on her face from the mask.  No  significant nasal congestion, thyromegaly or stridor.  LUNGS:   Breathing is unlabored, shallow consistent with body habitus.  HEART:  Heart sounds are regular with no murmur or gallop heard.   IMPRESSION:  1.  Obstructive sleep apnea.  2.  REM behavior disorder and possibly also night terrors and restless legs      with considerable overlap.  3.  Stress, anxiety and depression.   PLAN:  1.  Add doxepin 25 mg nightly on top of her current Cymbalta and clonazepam      long enough to see if it adds any value suppressing any of the      undesirable nighttime activity, but recognizing that it may increase her      leg movements.  2.  Keep scheduled followup.  In particularly, she is to follow closely with      Dr. Jacky Kindle.  Clinton D. Maple Hudson, MD, [facility], FACP   CDY/MedQ  DD:  11/24/2005  DT:  11/25/2005  Job #:  [id number]   cc:   Geoffry Paradise, MD

## 2010-09-18 NOTE — Assessment & Plan Note (Signed)
[facility]                             PULMONARY OFFICE NOTE   NAME:Perz, BATUL DIEGO                       MRN:          [id number]  DATE:02/09/2006                            DOB:          05/29/1950    PROBLEM:  1. Obstructive sleep apnea.  2. Rapid eye movement behavior disorder.  3. Periodic limb movement with arousal.  4. Exogenous obesity.  5. Rhinitis.   HISTORY:  She tried Doxepin 25 mg h.s. to consolidate sleep but it made  her worse, causing increased laughter and movement in her sleep between  2 and 4 a.m. so she stopped using it.  She is getting human growth  hormone and vitamin B12 injections in a weight loss program.  C-PAP at  [facility] seems to be comfortable for her.  She still drifts off some in the  day if she is sitting quietly and may start to dream, when she again  begins to laugh and talk.  I am not getting a history of cataplexy or  sleep paralysis but we are watching for question of narcolepsy.   MEDICATIONS:  1. Synthroid 150 mcg.  2. Clonazepam 0.5 mg at h.s.  3. Calcium with vitamin D.  4. C-PAP at 8.  5. Cymbalta 60 mg.   ALLERGIES:  Drug intolerant of MORPHINE and CODEINE.   OBJECTIVE:  Weight 270 pounds, blood pressure 118/60, pulse regular 70,  room air saturation 98%.  She is quite alert, no pressure marks from her  C-PAP on her face, no nasal congestion.  She is obese.  Heart sounds are  regular.  Breathing is unlabored.  There is no restlessness or tremor.   IMPRESSION:  1. Obstructive sleep apnea, index 16.7 per hour.  2. Significant movement and laughter in sleep which have been      consistent with REM behavior disorder and sleep disorder movement.  3. There is a component of stress at home which may be aggravating.      We had tried a sedating antidepressant.  I am going to try one      which is less sedating.   PLAN:  1. Try Vivactil 10 mg 1/2 to 1 taken at noon so that it will help her      stay awake  into the      evening.  2. Try clonazepam off and on at night to assess whether it is really      helping and whether it is contributing to her daytime sleepiness.      Schedule return 2 months.     Clinton D. Maple Hudson, MD, [facility], FACP  Electronically Signed    CDY/MedQ  DD: 04/16/2006  DT: 04/16/2006  Job #: [id number]

## 2010-09-18 NOTE — H&P (Addendum)
   NAME:  [name] [name], SETTERLUND                          ACCOUNT NO.:  [id number]   MEDICAL RECORD NO.:  [id number]                   PATIENT TYPE:  AMB   LOCATION:  DSC                                  FACILITY:  [facility]   PHYSICIAN:  Theophile Harvie DICTATOR                    DATE OF BIRTH:  [birth year]   DATE OF ADMISSION:  01/30/2002  DATE OF DISCHARGE:  01/30/2002                                HISTORY & PHYSICAL   PREOPERATIVE DIAGNOSIS:  Carpal tunnel syndrome right hand.   POSTOPERATIVE DIAGNOSIS:  Carpal tunnel syndrome right hand.   OPERATION/PROCEDURE:  Course of right median nerve.   SURGEON:  Nicki Reaper, M.D.   ASSISTANT:  ________ R.N.   ANESTHESIA:  Forearm based IV regional.   HISTORY:  The patient is a 60 year old female with carpal tunnel syndrome,  right hand, not responsive to conservative treatment.   DESCRIPTION OF PROCEDURE:  The patient was brought to the operating room  where a forearm based  IV regional anesthetic was carried out without  difficulty. She was prepped and draped using Betadine scrub and solution.   With the right arm free, a longitudinal incision was made in the palm and  carried down through the subcutaneous tissue. The deeper layers were  electrocauterized. The palmar fascia was split. The superficial palmaris was  identified. The flexor tendon to the ring and little finger was identified  to the ulnar side of the median nerve. The carpal retinaculum was incised  with sharp dissection.   A right angle____ retractor were placed between the skin and the forearm   fascia and the fascia was released approximately 3 cm proximal to the wrist  crease under direct vision. The canal was explored.  The tenosynovial tissue  was markedly thickened but no further lesions were identified. The wound was  irrigated. The skin was closed with interrupted 5-0 nylon sutures and a  sterile compressive dressing and a splint were applied.   The patient tolerated the  procedure well and was taken to the recovery room.  We followed this patient in satisfactory condition.  She is discharged home  and is to return to the [facility] [place] in one week with Darvocet-  N 100, Vioxx and Keflex.                                               Dorathea Faerber DICTATOR    DD/MEDQ  D:  01/30/2002  T:  02/01/2002  Job:  [id number]

## 2010-09-18 NOTE — Op Note (Signed)
[place]. [facility]  Patient:    [name] [name], MORGENTHALER Visit Number: [id number] MRN: [id number]          Service Type: DSU Location: [facility] Attending Physician:  Ronne Binning Dictated by:   Nicki Reaper, M.D. Proc. Date: 11/21/01 Admit Date:  11/21/2001 Discharge Date: 11/21/2001                             Operative Report  PREOPERATIVE DIAGNOSIS:  Carpal tunnel syndrome, left hand.  POSTOPERATIVE DIAGNOSIS:  Carpal tunnel syndrome, left hand.  PROCEDURE:  Decompression, left median nerve.  SURGEON:  Nicki Reaper, M.D.  ASSISTANT:  R.N.  ANESTHESIA:  Forearm-based IV regional.  HISTORY:  The patient is a 60 year old female with a history of carpal tunnel syndrome, EMG nerve conductions positive, which has not responded to conservative treatment.  DESCRIPTION OF PROCEDURE:   The patient was brought to the operating room, where a forearm-based IV regional anesthetic was carried out without difficulty.  She was prepped and draped using Betadine scrub and solution with the left arm free, in supine position.  A longitudinal incision was made in the palm, carried down through subcutaneous tissue.  Bleeders were electrocauterized.  The palmar fascia was split, the superficial palmar arch was identified, the flexor tendon to the ring and little finger identified to the ulnar side of the median nerve.  The carpal retinaculum was incised with sharp dissection.  A right angle and Sewell retractor were placed between skin and forearm fascia and the fascia was released for approximately 3 cm proximal to the wrist crease under direct vision.  The canal was explored and no further lesions were identified.  The wound was irrigated.  The skin was closed with interrupted 5-0 nylon sutures.  It is noted that an area of compression to the nerve was identified.  Sterile compressive dressing and splint was applied.  The patient tolerated the procedure well and was  taken to the recovery room for observation in satisfactory condition.  She is discharged home to return to the [facility] [place] in one week, on [place] and Keflex. Dictated by:   Nicki Reaper, M.D. Attending Physician:  Ronne Binning DD:  11/21/01 TD:  11/24/01 Job: [id number]

## 2010-09-24 ENCOUNTER — Encounter: Payer: Self-pay | Admitting: Internal Medicine

## 2010-10-05 ENCOUNTER — Ambulatory Visit (INDEPENDENT_AMBULATORY_CARE_PROVIDER_SITE_OTHER): Payer: 59 | Admitting: Internal Medicine

## 2010-10-05 ENCOUNTER — Encounter: Payer: Self-pay | Admitting: Internal Medicine

## 2010-10-05 VITALS — BP 120/74 | HR 75 | Ht 66.0 in | Wt 267.0 lb

## 2010-10-05 DIAGNOSIS — G4752 REM sleep behavior disorder: Secondary | ICD-10-CM

## 2010-10-05 DIAGNOSIS — G2589 Other specified extrapyramidal and movement disorders: Secondary | ICD-10-CM

## 2010-10-05 DIAGNOSIS — G4733 Obstructive sleep apnea (adult) (pediatric): Secondary | ICD-10-CM

## 2010-10-05 DIAGNOSIS — J310 Chronic rhinitis: Secondary | ICD-10-CM

## 2010-10-05 NOTE — Patient Instructions (Signed)
I strongly recommend you continue CPAP all night every night  Continue Vivactil  You can use a pill splitter to cut your  clonazepams in half. Try reducing by 1/2 tab per week as tolerated, to see how much you still need.

## 2010-10-05 NOTE — Progress Notes (Signed)
  Subjective:    Patient ID: [name] [name], female    DOB: July 27, 1950, 60 y.o.   MRN: [id number]  HPI 10/05/10- 60 yoF followed for sleep apnea, REM Behavior Disorder, limb movement sleep disturbance,  allergic rhinitis Last here April 06, 2010- note reviewed. Says she is doing well. She has been drifting off to sleep without CPAP and felt well the next day. She is concerned about difficulty with getting life insurance due to her weight and use of an antidepressant (vivactil). She takes 1 vivactil given to suppress REM and reduce associated activity in sleep, not specifically as an antidepressant.. She overnighted for observation after a gyn procedure and they only gave 0.5 mg clonazepam which did ok,  after anesthesia earlier that day. The nurse had been reluctant to give her usual clonazepam dose after earlier anesthesia.  Review of Systems Constitutional:   No weight loss, night sweats,  Fevers, chills, fatigue, lassitude. HEENT:   No headaches,  Difficulty swallowing,  Tooth/dental problems,  Sore throat,                No sneezing, itching, ear ache, nasal congestion, post nasal drip,   CV:  No chest pain,  Orthopnea, PND, swelling in lower extremities, anasarca, dizziness, palpitations  GI  No heartburn, indigestion, abdominal pain, nausea, vomiting, diarrhea, change in bowel habits, loss of appetite  Resp: No shortness of breath with exertion or at rest.  No excess mucus, no productive cough,  No non-productive cough,  No coughing up of blood.  No change in color of mucus.  No wheezing.  Skin: no rash or lesions.  GU: no dysuria, change in color of urine, no urgency or frequency.  No flank pain.  MS:  No joint pain or swelling.  No decreased range of motion.  No back pain.  Psych:  No change in mood or affect. No depression or anxiety.  No memory loss.      Objective:   Physical Exam General- Alert, Oriented, Affect-appropriate, Distress- none acute   obese  Skin- rash-none,  lesions- none, excoriation- none  Lymphadenopathy- none  Head- atraumatic  Eyes- Gross vision intact, PERRLA, conjunctivae clear secretions  Ears- Hearing, canals, Tm- normal   Nose- Clear, No- Septal dev, mucus, polyps, erosion, perforation   Throat- Mallampati II , mucosa clear , drainage- none, tonsils- atrophic  Neck- flexible , trachea midline, no stridor , thyroid nl, carotid no bruit  Chest - symmetrical excursion , unlabored     Heart/CV- RRR , no murmur , no gallop  , no rub, nl s1 s2                     - JVD- none , edema- none, stasis changes- none, varices- none     Lung- clear to P&A, wheeze- none, cough- none , dullness-none, rub- none     Chest wall-   Abd- tender-no, distended-no, bowel sounds-present, HSM- no  Br/ Gen/ Rectal- Not done, not indicated  Extrem- cyanosis- none, clubbing, none, atrophy- none, strength- nl  Neuro- grossly intact to observation, Not restless or jiggling.         Assessment & Plan:

## 2010-10-05 NOTE — Assessment & Plan Note (Signed)
Strongly encouraged to continue CPAP and not drift off, especially since she is alone and cant tell if she snores.

## 2010-10-05 NOTE — Assessment & Plan Note (Signed)
She would like to try reducing clonazepam again/. We discussed this and she will try slow taper off with a pill splitter.

## 2010-10-05 NOTE — Assessment & Plan Note (Signed)
Slowly resolving cold. She can take an antihistamine if needed.

## 2010-10-08 NOTE — Assessment & Plan Note (Signed)
Without witness now, I can't be sure that all of her limb movement is REM-related and some may better fit the PLM pattern.

## 2010-10-09 ENCOUNTER — Ambulatory Visit
Admission: RE | Admit: 2010-10-09 | Discharge: 2010-10-09 | Disposition: A | Discharge status: Home / Self Care | Payer: 59 | Source: Ambulatory Visit | Attending: Obstetrics and Gynecology | Admitting: Obstetrics and Gynecology

## 2010-10-09 DIAGNOSIS — Z1231 Encounter for screening mammogram for malignant neoplasm of breast: Secondary | ICD-10-CM

## 2011-04-05 ENCOUNTER — Ambulatory Visit (INDEPENDENT_AMBULATORY_CARE_PROVIDER_SITE_OTHER): Payer: 59 | Admitting: Internal Medicine

## 2011-04-05 ENCOUNTER — Encounter: Payer: Self-pay | Admitting: Internal Medicine

## 2011-04-05 VITALS — BP 122/70 | HR 84 | Ht 66.0 in | Wt 259.2 lb

## 2011-04-05 DIAGNOSIS — G4752 REM sleep behavior disorder: Secondary | ICD-10-CM

## 2011-04-05 DIAGNOSIS — G4733 Obstructive sleep apnea (adult) (pediatric): Secondary | ICD-10-CM

## 2011-04-05 NOTE — Progress Notes (Signed)
Patient ID: [name] [name], female    DOB: Oct 14, 1950, 60 y.o.   MRN: [id number]  HPI 10/05/10- 60 yoF followed for sleep apnea, REM Behavior Disorder, limb movement sleep disturbance,  allergic rhinitis Last here April 06, 2010- note reviewed. Says she is doing well. She has been drifting off to sleep without CPAP and felt well the next day. She is concerned about difficulty with getting life insurance due to her weight and use of an antidepressant (vivactil). She takes 1 vivactil given to suppress REM and reduce associated activity in sleep, not specifically as an antidepressant.. She overnighted for observation after a gyn procedure and they only gave 0.5 mg clonazepam which did ok,  after anesthesia earlier that day. The nurse had been reluctant to give her usual clonazepam dose after earlier anesthesia.  04/05/11- 60 yoF followed for sleep apnea, REM Behavior Disorder, limb movement sleep disturbance,  allergic rhinitis Has had flu vaccine. Has done better this year. Still talks in her sleep. Nocturia x1. Weaned off of Vivactil and doing okay without it. In retrospect she's not sure that it did anything. She has also come off of clonazepam. Little awareness now of dreams. Trying to get active in exercise classes as she gets used to having more time, now that her husband is gone. CPAP.  Review of Systems Constitutional:   No-   weight loss, night sweats, fevers, chills, fatigue, lassitude. HEENT:   No-  headaches, difficulty swallowing, tooth/dental problems, sore throat,       No-  sneezing, itching, ear ache, nasal congestion, post nasal drip,  CV:  No-   chest pain, orthopnea, PND, swelling in lower extremities, anasarca,                                  dizziness, palpitations Resp: No-   shortness of breath with exertion or at rest.              No-   productive cough,  No non-productive cough,  No- coughing up of blood.              No-   change in color of mucus.  No- wheezing.   Skin:  No-   rash or lesions. GI:  No-   heartburn, indigestion, abdominal pain, nausea, vomiting, diarrhea,                 change in bowel habits, loss of appetite GU: No-   dysuria, change in color of urine, no urgency or frequency.  No- flank pain. MS:  No-   joint pain or swelling.  No- decreased range of motion.  No- back pain. Neuro-     nothing unusual Psych:  No- change in mood or affect. Less depression or anxiety.  No memory loss.      Objective:   Physical Exam General- Alert, Oriented, Affect-appropriate, Distress- none ; morbidly obese Skin- rash-none, lesions- none, excoriation- none Lymphadenopathy- none Head- atraumatic            Eyes- Gross vision intact, PERRLA, conjunctivae clear secretions            Ears- Hearing, canals-normal            Nose- Clear, no-Septal dev, mucus, polyps, erosion, perforation             Throat- Mallampati II , mucosa clear , drainage- none, tonsils- atrophic Neck- flexible ,  trachea midline, no stridor , thyroid nl, carotid no bruit Chest - symmetrical excursion , unlabored           Heart/CV- RRR , no murmur , no gallop  , no rub, nl s1 s2                           - JVD- none , edema- none, stasis changes- none, varices- none           Lung- clear to P&A, wheeze- none, cough- none , dullness-none, rub- none           Chest wall-  Abd- tender-no, distended-no, bowel sounds-present, HSM- no Br/ Gen/ Rectal- Not done, not indicated Extrem- cyanosis- none, clubbing, none, atrophy- none, strength- nl Neuro- grossly intact to observation

## 2011-04-05 NOTE — Patient Instructions (Signed)
Please call as needed 

## 2011-04-08 NOTE — Assessment & Plan Note (Signed)
I suspect we are seeing improvement in her sleep disturbance reflecting decreased stress and depression now that her seriously ill husband is no longer requiring her attention.

## 2011-04-08 NOTE — Assessment & Plan Note (Signed)
She is still encouraged to use CPAP every night. She gets no feedback now with her husband dead.

## 2011-09-17 ENCOUNTER — Other Ambulatory Visit: Payer: Self-pay | Admitting: Obstetrics and Gynecology

## 2011-09-17 DIAGNOSIS — Z1231 Encounter for screening mammogram for malignant neoplasm of breast: Secondary | ICD-10-CM

## 2011-10-13 ENCOUNTER — Ambulatory Visit
Admission: RE | Admit: 2011-10-13 | Discharge: 2011-10-13 | Disposition: A | Discharge status: Home / Self Care | Payer: 59 | Source: Ambulatory Visit | Attending: Obstetrics and Gynecology | Admitting: Obstetrics and Gynecology

## 2011-10-13 DIAGNOSIS — Z1231 Encounter for screening mammogram for malignant neoplasm of breast: Secondary | ICD-10-CM

## 2012-02-07 ENCOUNTER — Ambulatory Visit (INDEPENDENT_AMBULATORY_CARE_PROVIDER_SITE_OTHER): Payer: 59 | Admitting: Internal Medicine

## 2012-02-07 ENCOUNTER — Encounter: Payer: Self-pay | Admitting: Internal Medicine

## 2012-02-07 VITALS — BP 120/76 | HR 64 | Ht 66.0 in | Wt 270.8 lb

## 2012-02-07 DIAGNOSIS — G4733 Obstructive sleep apnea (adult) (pediatric): Secondary | ICD-10-CM

## 2012-02-07 DIAGNOSIS — G4752 REM sleep behavior disorder: Secondary | ICD-10-CM

## 2012-02-07 NOTE — Progress Notes (Signed)
Patient ID: [name] [name], female    DOB: Mar 01, 1951, 61 y.o.   MRN: [id number]  HPI 10/05/10- 60 yoF followed for sleep apnea, REM Behavior Disorder, limb movement sleep disturbance,  allergic rhinitis Last here April 06, 2010- note reviewed. Says she is doing well. She has been drifting off to sleep without CPAP and felt well the next day. She is concerned about difficulty with getting life insurance due to her weight and use of an antidepressant (vivactil). She takes 1 vivactil given to suppress REM and reduce associated activity in sleep, not specifically as an antidepressant.. She overnighted for observation after a gyn procedure and they only gave 0.5 mg clonazepam which did ok,  after anesthesia earlier that day. The nurse had been reluctant to give her usual clonazepam dose after earlier anesthesia.  04/05/11- 60 yoF followed for sleep apnea, REM Behavior Disorder, limb movement sleep disturbance,  allergic rhinitis Has had flu vaccine. Has done better this year. Still talks in her sleep. Nocturia x1. Weaned off of Vivactil and doing okay without it. In retrospect she's not sure that it did anything. She has also come off of clonazepam. Little awareness now of dreams. Trying to get active in exercise classes as she gets used to having more time, now that her husband is gone. CPAP.  02/07/12- 60 yoF followed for sleep apnea, REM Behavior Disorder, limb movement sleep disturbance,  allergic rhinitis Uses CPAP 9/ [facility] every night for about 9-10 hours; pressure doing well; states presssure has not been checked lately. Had flu vaccine. Still talks in her sleep (daughter lives with her). No longer using Klonopin.  Review of Systems- see HPI Constitutional:   No-   weight loss, night sweats, fevers, chills, fatigue, lassitude. HEENT:   No-  headaches, difficulty swallowing, tooth/dental problems, sore throat,       No-  sneezing, itching, ear ache, nasal congestion, post nasal drip,  CV:   No-   chest pain, orthopnea, PND, swelling in lower extremities, anasarca,  dizziness, palpitations Resp: No-   shortness of breath with exertion or at rest.              No-   productive cough,  No non-productive cough,  No- coughing up of blood.              No-   change in color of mucus.  No- wheezing.   Skin: No-   rash or lesions. GI:  No-   heartburn, indigestion, abdominal pain, nausea, vomiting,  GU:  MS:  No-   joint pain or swelling.  Neuro-     nothing unusual Psych:  No- change in mood or affect. Less depression or anxiety.  No memory loss. Objective:   Physical Exam General- Alert, Oriented, Affect-appropriate, Distress- none ; morbidly obese Skin- rash-none, lesions- none, excoriation- none Lymphadenopathy- none Head- atraumatic            Eyes- Gross vision intact, PERRLA, conjunctivae clear secretions            Ears- Hearing, canals-normal            Nose- Clear, no-Septal dev, mucus, polyps, erosion, perforation             Throat- Mallampati II , mucosa clear , drainage- none, tonsils- atrophic Neck- flexible , trachea midline, no stridor , thyroid nl, carotid no bruit Chest - symmetrical excursion , unlabored           Heart/CV- RRR ,  no murmur , no gallop  , no rub, nl s1 s2                           - JVD- none , edema- none, stasis changes- none, varices- none           Lung- clear to P&A, wheeze- none, cough- none , dullness-none, rub- none           Chest wall-  Abd-  Br/ Gen/ Rectal- Not done, not indicated Extrem- cyanosis- none, clubbing, none, atrophy- none, strength- nl Neuro- grossly intact to observation

## 2012-02-07 NOTE — Patient Instructions (Addendum)
You can take your CPAP machine by [facility] and ask them to check it out for you.  Please call as needed

## 2012-02-13 NOTE — Assessment & Plan Note (Signed)
Describes good compliance and control 

## 2012-02-13 NOTE — Assessment & Plan Note (Signed)
She no longer feels she needs clonazepam. Says she is widowed, daughter is living with her and tells her that she talks in her sleep but does not cry out as often does not sleep walk. She is satisfied to keep some clonazepam on hand.

## 2013-02-06 ENCOUNTER — Ambulatory Visit: Payer: 59 | Admitting: Internal Medicine

## 2013-03-28 ENCOUNTER — Ambulatory Visit: Payer: 59 | Admitting: Internal Medicine

## 2013-05-11 ENCOUNTER — Ambulatory Visit: Payer: 59 | Admitting: Internal Medicine

## 2013-12-31 ENCOUNTER — Other Ambulatory Visit: Payer: Self-pay | Admitting: Obstetrics and Gynecology

## 2014-01-01 LAB — CYTOLOGY - PAP

## 2014-01-04 ENCOUNTER — Other Ambulatory Visit: Payer: Self-pay | Admitting: Obstetrics and Gynecology

## 2014-01-04 DIAGNOSIS — R928 Other abnormal and inconclusive findings on diagnostic imaging of breast: Secondary | ICD-10-CM

## 2014-01-22 ENCOUNTER — Ambulatory Visit
Admission: RE | Admit: 2014-01-22 | Discharge: 2014-01-22 | Disposition: A | Discharge status: Home / Self Care | Payer: 59 | Source: Ambulatory Visit | Attending: Obstetrics and Gynecology | Admitting: Obstetrics and Gynecology

## 2014-01-22 DIAGNOSIS — R928 Other abnormal and inconclusive findings on diagnostic imaging of breast: Secondary | ICD-10-CM

## 2015-01-07 ENCOUNTER — Other Ambulatory Visit: Payer: Self-pay | Admitting: Obstetrics and Gynecology

## 2015-01-08 LAB — CYTOLOGY - PAP

## 2015-03-21 DIAGNOSIS — I4892 Unspecified atrial flutter: Secondary | ICD-10-CM | POA: Insufficient documentation

## 2015-03-21 DIAGNOSIS — R0602 Shortness of breath: Secondary | ICD-10-CM | POA: Insufficient documentation

## 2015-03-21 DIAGNOSIS — E039 Hypothyroidism, unspecified: Secondary | ICD-10-CM | POA: Insufficient documentation

## 2015-03-21 DIAGNOSIS — M797 Fibromyalgia: Secondary | ICD-10-CM | POA: Insufficient documentation

## 2015-03-21 DIAGNOSIS — E785 Hyperlipidemia, unspecified: Secondary | ICD-10-CM | POA: Insufficient documentation

## 2015-03-24 ENCOUNTER — Encounter: Payer: Self-pay | Admitting: Internal Medicine

## 2015-03-24 ENCOUNTER — Ambulatory Visit (INDEPENDENT_AMBULATORY_CARE_PROVIDER_SITE_OTHER): Payer: PPO | Admitting: Internal Medicine

## 2015-03-24 VITALS — BP 138/72 | HR 78 | Ht 66.0 in | Wt 267.0 lb

## 2015-03-24 DIAGNOSIS — I4892 Unspecified atrial flutter: Secondary | ICD-10-CM | POA: Diagnosis not present

## 2015-03-24 NOTE — Patient Instructions (Signed)
Medication Instructions: - no changes   Labwork: - none  Procedures/Testing: - Your physician has recommended that you have a sleep study. This test records several body functions during sleep, including: brain activity, eye movement, oxygen and carbon dioxide blood levels, heart rate and rhythm, breathing rate and rhythm, the flow of air through your mouth and nose, snoring, body muscle movements, and chest and belly movement.- we will be in touch with you to schedule this.  Follow-Up: - Dr. Jodi MarbleGangi's office should be in touch with you to schedule a Cardioversion.  Any Additional Special Instructions Will Be Listed Below (If Applicable).

## 2015-03-24 NOTE — Progress Notes (Signed)
ELECTROPHYSIOLOGY CONSULT NOTE  Patient ID: [name] [name], MRN: [id number], DOB/AGE: June 29, 1950 64 y.o. Admit date: (Not on file) Date of Consult: 03/24/2015  Primary Physician: Minda Meo, MD Primary Cardiologist: Ottis Stain  Chief Complaint: atrial flutter   HPI [name] [name] is a 64 y.o. female  With a prior diagnosis of obstructive sleep apnea-currently untreated, treated hypothyroidism with recently identified iatrogenic hyperthyroidism and recently appreciated atrial flutter with a rapid ventricular rate. This was picked up with a history of palpitations and modest dyspnea on exertion. She is found to be in atrial flutter by her primary care doctor, Dr. Inda Castle, who referred her to Dr. Jacinto Halim. He confirmed atrial flutter. Standard treadmill testing was undertaken and was nonischemic notable for poor exercise tolerance. She underwent echocardiogram demonstrated normal LV function with mild left atrial enlargement.  She's also started on beta blockers, calcium blockers and dabigitran with improved rate control.  She continues with symptoms of modest fatigue and exercise intolerance.    Past Medical History  Diagnosis Date  . REM sleep behavior disorder   . OSA (obstructive sleep apnea)   . Anxiety and depression   . Unspecified hypothyroidism   . Periodic limb movement disorder   . Rhinitis   . Exogenous obesity   . SOB (shortness of breath)   . Morbid obesity (HCC)   . Fibromyalgia   . Hyperlipidemia   . Hypothyroidism   . Spinal stenosis   . Paroxysmal atrial flutter [facility])       Surgical History:  Past Surgical History  Procedure Laterality Date  . Left rotator cuff    . Carpal tunnel release      Bilateral  . Dilation and curettage of uterus    . Resection uterine polyp  2011     Home Meds: Prior to Admission medications   Medication Sig Start Date End Date Taking? Authorizing Provider  dabigatran (PRADAXA) 150 MG CAPS capsule Take 150 mg by mouth 2 (two)  times daily.   Yes Historical Provider, MD  diltiazem (TIAZAC) 120 MG 24 hr capsule Take 120 mg by mouth daily.   Yes Historical Provider, MD  levothyroxine (SYNTHROID, LEVOTHROID) 200 MCG tablet Take 200 mcg by mouth daily.   Yes Historical Provider, MD  metoprolol tartrate (LOPRESSOR) 25 MG tablet Take 25 mg by mouth 3 (three) times daily.   Yes Historical Provider, MD    Allergies:  Allergies  Allergen Reactions  . Codeine   . Morphine     Social History   Social History  . Marital Status: Widowed    Spouse Name: N/A  . Number of Children: N/A  . Years of Education: N/A   Occupational History  . Not on file.   Social History Main Topics  . Smoking status: Never Smoker   . Smokeless tobacco: Not on file  . Alcohol Use: Not on file  . Drug Use: Not on file  . Sexual Activity: Not on file   Other Topics Concern  . Not on file   Social History Narrative     Family History  Problem Relation Age of Onset  . Adopted: Yes  . Cervical cancer Mother      ROS:  Please see the history of present illness.     All other systems reviewed and negative.    Physical Exam:   Blood pressure 138/72, pulse 78, height  (1.676 m), weight 267 lb (121.11 kg). General: Well developed,Morbidly obese   female in  no acute distress. Head: Normocephalic, atraumatic, sclera non-icteric, no xanthomas, nares are without discharge. EENT: normal  Lymph Nodes:  none Neck: Negative for carotid bruits. JVD 8-10 cm Back:without scoliosis kyphosis*  Lungs: Clear bilaterally to auscultation without wheezes, rales, or rhonchi. Breathing is unlabored. Heart: Irregularly irregular rhythm without significant murmurs . No rubs, or gallops appreciated. Abdomen: Soft, non-tender, non-distended with normoactive bowel sounds. No hepatomegaly. No rebound/guarding. No obvious abdominal masses. Msk:  Strength and tone appear normal for age. Extremities: No clubbing or cyanosis edema.  Distal pedal pulses  are 2+ and equal bilaterally. Skin: Warm and Dry Neuro: Alert and oriented X 3. CN III-XII intact Grossly normal sensory and motor function . Psych:  Responds to questions appropriately with a normal affect.      Labs: Cardiac Enzymes No results for input(s): CKTOTAL, CKMB, TROPONINI in the last 72 hours. CBC Lab Results  Component Value Date   WBC 10.0 03/19/2010   HGB 13.7 03/19/2010   HCT 40.8 03/19/2010   MCV 91.3 03/19/2010   PLT 227 03/19/2010   PROTIME: No results for input(s): LABPROT, INR in the last 72 hours. Chemistry No results for input(s): NA, K, CL, CO2, BUN, CREATININE, CALCIUM, PROT, BILITOT, ALKPHOS, ALT, AST, GLUCOSE in the last 168 hours.  Invalid input(s): LABALBU Lipids No results found for: CHOL, HDL, LDLCALC, TRIG BNP No results found for: PROBNP Thyroid Function Tests: No results for input(s): TSH, T4TOTAL, T3FREE, THYROIDAB in the last 72 hours.  Invalid input(s): FREET3 Miscellaneous No results found for: DDIMER  Radiology/Studies:  No results found.  EKG: Atrial flutter at 78 with variable block -/09/36 Atypical flutter with coarse flutter waves in both lead V1 and the inferior leads   Assessment and Plan:  Atypical atrial flutter  Sleep apnea-untreated  Hyperthyroidism-iatrogenic  Morbid obesity   her CHADS-VASc score is 2+  it is reasonable for her to be on anticoagulation. Given the atypical nature of her atrial flutter, I am not sure that is going to be right atrial; hence, I would suggest that we undertake cardioversion first following 3 weeks of anticoagulation. I will defer this to Dr. Ottis StainJG. In the event that her atrial flutter recurrence is infrequent, serial cardioversion would be appropriate. In the event that it is more frequent than that, either Dr. Derek JackWC or Fawn KirkJA would be appropriate to see her for mapping and ablation. I have reviewed the above with her.  We will also undertake a repeat sleep study as it has been years since her  last sleep study. In the event that is abnormal we will refer her internally as per her request  I discussed her thyroid status today with Dr. Dione BoozeA        Steven Klein

## 2015-03-25 ENCOUNTER — Other Ambulatory Visit: Payer: Self-pay | Admitting: *Deleted

## 2015-03-25 DIAGNOSIS — G4733 Obstructive sleep apnea (adult) (pediatric): Secondary | ICD-10-CM

## 2015-04-28 NOTE — H&P (Signed)
OFFICE VISIT NOTES COPIED TO EPIC FOR DOCUMENTATION [name] [name] 04/07/2015 8:25 AM Location: [facility] Patient #: [phone] DOB: Jan 16, 1951 Widowed / Language: [name] [name] / Race: White Female   History of Present Illness [name] Page MD; 04/07/2015 1:27 PM) Patient words: Last o/v 03-16-2015; Pt here for 3 week F/U; Pt is scheduled to have a Sleep Study 05/27/2014.  The patient is a 64 year old female who presents for a follow-up for Atrial flutter. Caucasian female with past medical history of mild hyperlipidemia and hypothyroidism, morbid obesity but no HTN or DM or h/o tobacco use, who presents for evaluation of atrial flutter, when she presented on 03/13/2015 when she presented with 2 days of palpitations, was seen by her PCP this morning and referred to me on a stat for evaluation of arrhythmia. She has felt mild shortness of breath but no chest pain. No dizziness or syncope. She was found to be in atrial flutter with rapid ventricle response.   She was seen by Dr. [name] was recommended cardioversion first prior to attempting ablation. She now presents here to the office for further discussions, she has started to use CPAP on a regular basis. She denies any chest pain, PND, orthopnea, edema, syncope, or symptoms suggestive of claudication or TIA. She has been diagnosed with sleep apnea and states she has tried to use a CPAP but does not tolerate this well.  Routine treadmill excess stress test on 10/11/2014 which had revealed markedly reduced exercise tolerance at 4 minutes achieving 90% MPHR without ischemia and achieved 5.8 mets. Her symptoms suggested PACs and PVCs, conservative approach watchful observation. Echocardiogram on 03/18/2015 had revealed normal LV systolic function with mild mitral and tricuspid regurgitation without pulmonary hypertension.   Problem List/Past Medical ([name] [name]; 04/07/2015 11:41 AM) Shortness of breath on exertion (R06.02)  Treadmill Exercise stress 10/11/2014: Indication:Palpitations, Chest pain The patient exercised according to Bruce Protocol, Total time recorded 04:26mn achieving max heart rate of 141 which was 90% of MPHR for age and 5.80 METS of work. Normal BP response. Resting ECG NSR, low voltage complexes. There was no ST-T changes of ischemia with exercise stress test. Stress terminated due to THR (>85% MPHR)/MPHR met and fatigue. Decreased exercise tolerence. Morbid obesity due to excess calories (E66.01) BMI 40.0-44.9, adult (Z68.41) Fibromyalgia (M79.7) HLD (hyperlipidemia) (E78.5) Hypothyroidism (E03.9) Spinal stenosis (M48.00) OSA on CPAP (G47.33)2006 non compliant with c-pap. Denies symptoms of sleep apnea Atrial flutter, paroxysmal (I48.92)03/13/2015 CHA2DS2-VASc Score is 2 with yearly risk of stroke of 2.2 %. HAS-Bled score is 1 and estimated major bleeding in one year is 1-1.5% CC: Dr. SJolyn NapOV notes 03/24/2015: Cardioversion and if recurrent A. Flutter, then consider ablation by JA or WC. Labs 09/18/2014: TSH 0.503, T4 14.5, T3 3.4  Allergies ([name] [name]; 04/07/2015 11:41 AM) Morphine Derivatives Nausea, Vomiting. Codeine/Codeine Derivatives Nausea, Vomiting.  Family History ([name] [name] 04/07/2015 11:41 AM) Family history unknown patient is adopted Sister 1 no information  Social History ([name] [name]; 04/07/2015 11:51 AM) Current tobacco use Never smoker. Non Drinker/No Alcohol Use Marital status Widowed. Number of Children 2. Living Situation Boyfriend lives with her  Past Surgical History ([name] [name] 04/07/2015 11:41 AM) Carpal Tunnel Surgery - Both Rotator Cuff Repair - Left Cyst removed from Thumb  Left.  Medication History ([name] [name]; 04/07/2015 11:50 AM) Aspirin ('81MG'$  Tablet, 1 Oral daily, Taken starting 03/13/2015) Discontinued: per Dr GEinar Gip Pradaxa ('150MG'$  Capsule, 1 (one) Capsule Capsule Oral two times daily, Taken starting  03/13/2015) Active.  Metoprolol Tartrate ('25MG'$  Tablet, 1 (one) Tablet Tablet Oral three times daily, Taken starting 03/13/2015) Active. DiltiaZEM CD ('120MG'$  Capsule ER 24HR, 1 (one) Capsule Capsule Oral daily, Taken starting 03/13/2015) Active. Levothyroxine Sodium (200MCG Tablet, 1 Oral daily) Active. Medications Reconciled (Verbally)  Diagnostic Studies History ([name] [name]; 2015-05-02 11:42 AM) Echocardiogram11/15/2016 1. Left ventricle cavity is normal in size. Normal global wall motion. Visual EF is 60-65%. 2. Left atrial cavity is mildly dilated. 3. Trace aortic regurgitation. 4. Mild mitral regurgitation. 5. Mild tricuspid regurgitation. No evidence of pulmonary hypertension. Labwork 03/13/2015: TSH 0.26, free T4 normal at 1.6 Sleep Study2006 Pt positive for Sleep Apnea; pt does not use CPAP Nuclear stress test2012 Colonoscopy2012    Review of Systems [name] Page MD; 05/02/2015 1:33 PM) General Present- Fatigue. Not Present- Anorexia and Fever. Respiratory Present- Difficulty Breathing on Exertion and Snoring (has sleep apnea on CPAP). Not Present- Cough and Dyspnea. Cardiovascular Present- Palpitations. Not Present- Chest Pain, Claudications, Edema, Orthopnea, Paroxysmal Nocturnal Dyspnea and Shortness of Breath. Gastrointestinal Not Present- Change in Bowel Habits, Constipation and Nausea. Neurological Not Present- Focal Neurological Symptoms. Endocrine Not Present- Appetite Changes, Cold Intolerance and Heat Intolerance. Hematology Not Present- Anemia, Petechiae and Prolonged Bleeding.  Vitals [name] Page MD; 2015-05-02 1:22 PM) 05/02/2015 11:43 AM Weight: 268.44 lb Height: 66in Body Surface Area: 2.27 m Body Mass Index: 43.33 kg/m  Pulse: 43 (Irregular)  P.OX: 99% (Room air) BP: 136/82 (Sitting, Left Arm, Large)       Physical Exam [name] Page MD; 2015-05-02 1:21 PM) General Mental Status-Alert. General  Appearance-Cooperative, Appears stated age, Not in acute distress. Orientation-Oriented X3. Build & Nutrition-Well built and Morbidly obese.  Head and Neck Thyroid Gland Characteristics - no palpable nodules, no palpable enlargement.  Chest and Lung Exam Chest and lung exam reveals -on auscultation, normal breath sounds, no adventitious sounds and normal vocal resonance. Palpation Tender - No chest wall tenderness.  Cardiovascular Inspection Jugular vein - Right - No Distention. Auscultation Rhythm - Irregularly irregular. Heart Sounds - S1 is variable, S2 WNL and No gallop present. Murmurs & Other Heart Sounds - Murmur - No murmur.  Abdomen Palpation/Percussion Normal exam - Non Tender and No hepatosplenomegaly. Auscultation Normal exam - Bowel sounds normal.  Peripheral Vascular Lower Extremity Inspection - Left - No Pigmentation, No Varicose veins. Right - No Pigmentation, No Varicose veins. Palpation - Edema - Bilateral - No edema. Femoral pulse - Bilateral - Feeble(Pulsus difficult to feel due to patient's bodily habitus.). Popliteal pulse - Bilateral - Feeble(Pulsus difficult to feel due to patient's bodily habitus.). Dorsalis pedis pulse - Bilateral - Normal. Posterior tibial pulse - Bilateral - Normal. Carotid arteries - Bilateral-No Carotid bruit. Abdomen-No prominent abdominal aortic pulsation, No epigastric bruit.  Neurologic Motor-Grossly intact without any focal deficits.  Musculoskeletal Global Assessment Left Lower Extremity - normal range of motion without pain. Right Lower Extremity - normal range of motion without pain.    Assessment & Plan (Devonna Shumate; 2015-05-02 3:31 PM) Atrial flutter, paroxysmal (I48.92) Story: CHA2DS2-VASc Score is 2 with yearly risk of stroke of 2.2 %. HAS-Bled score is 1 and estimated major bleeding in one year is 1-1.5%  CC: Dr. Jolyn Nap OV notes 03/24/2015: Cardioversion and if recurrent A. Flutter, then  consider ablation by JA or WC.  Labs 09/18/2014: TSH 0.503, T4 14.5, T3 3.4 Impression: EKG 2015-05-02: Atrial flutter with variable ventricular response at the rate of 93 bpm, normal axis, nonspecific T abnormality. No significant change from EKG 03/17/2015.  EKG 03/13/2015: Atrial flutter with 2:1 AV conduction, ventricular rate are 136 bpm. Normal axis, nonspecific T abnormality.  EKG 09/18/2014: Normal sinus rhythm at rate of 68 bpm, normal intervals. No evidence of ischemia, normal EKG. Current Plans Complete electrocardiogram (93000) Future Plans 29/24/4628: METABOLIC PANEL, BASIC (63817) - one time Shortness of breath on exertion (R06.02) Story: Treadmill Exercise stress 10/11/2014: Indication:Palpitations, Chest pain The patient exercised according to Bruce Protocol, Total time recorded 04:38mn achieving max heart rate of 141 which was 90% of MPHR for age and 5.80 METS of work. Normal BP response. Resting ECG NSR, low voltage complexes. There was no ST-T changes of ischemia with exercise stress test. Stress terminated due to THR (>85% MPHR)/MPHR met and fatigue. Decreased exercise tolerence. Palpitation (R00.2) Morbid obesity due to excess calories (E66.01) BMI 40.0-44.9, adult (Z68.41) OSA on CPAP (G47.33) Story: Now has started to use CPAP. Has been set up for sleep study . 05/28/2015 by Dr. KCaryl Comes Current Plans Mechanism of underlying disease process and action of medications discussed with the patient. I discussed primary/secondary prevention and also dietary counceling was done. She is here for a follow-up office visit after three-week visit, she was evaluated by Dr. SVirl Axe who recommended cardioversion prior to considering ablation for atypical atrial flutter, probably left-sided pathway.  Issues daughter present the bedside, I'll issues including anticoagulation discussed. I'll set her up for cardiac motion, I will see her back after the procedure to confirm success, and I  will again see her back in 3 months, if she maintains sinus rhythm I will consider discontinuation of long-term anticoagulation. Patient previously was not using CPAP on a regular basis, but since she has gone into atrial flutter, patient has been compliant with CPAP and she has been set up for a repeat sleep study to reevaluate her needs.    Signed by JLaverda Page MD (04/07/2015 5:55 PM)

## 2015-04-29 ENCOUNTER — Encounter (HOSPITAL_COMMUNITY)
Admission: RE | Disposition: A | Discharge status: Home / Self Care | Payer: Self-pay | Source: Ambulatory Visit | Attending: Cardiology

## 2015-04-29 ENCOUNTER — Ambulatory Visit (HOSPITAL_COMMUNITY)
Admission: RE | Admit: 2015-04-29 | Discharge: 2015-04-29 | Disposition: A | Discharge status: Home / Self Care | Payer: PPO | Source: Ambulatory Visit | Attending: Cardiology | Admitting: Cardiology

## 2015-04-29 ENCOUNTER — Ambulatory Visit (HOSPITAL_COMMUNITY): Payer: PPO | Admitting: Critical Care Medicine

## 2015-04-29 ENCOUNTER — Encounter (HOSPITAL_COMMUNITY): Payer: Self-pay | Admitting: Critical Care Medicine

## 2015-04-29 DIAGNOSIS — I4892 Unspecified atrial flutter: Secondary | ICD-10-CM | POA: Insufficient documentation

## 2015-04-29 DIAGNOSIS — Z7902 Long term (current) use of antithrombotics/antiplatelets: Secondary | ICD-10-CM | POA: Diagnosis not present

## 2015-04-29 DIAGNOSIS — E785 Hyperlipidemia, unspecified: Secondary | ICD-10-CM | POA: Insufficient documentation

## 2015-04-29 DIAGNOSIS — E039 Hypothyroidism, unspecified: Secondary | ICD-10-CM | POA: Diagnosis not present

## 2015-04-29 DIAGNOSIS — Z79899 Other long term (current) drug therapy: Secondary | ICD-10-CM | POA: Diagnosis not present

## 2015-04-29 DIAGNOSIS — Z6841 Body Mass Index (BMI) 40.0 and over, adult: Secondary | ICD-10-CM | POA: Insufficient documentation

## 2015-04-29 DIAGNOSIS — M797 Fibromyalgia: Secondary | ICD-10-CM | POA: Diagnosis not present

## 2015-04-29 DIAGNOSIS — G4733 Obstructive sleep apnea (adult) (pediatric): Secondary | ICD-10-CM | POA: Insufficient documentation

## 2015-04-29 DIAGNOSIS — Z7982 Long term (current) use of aspirin: Secondary | ICD-10-CM | POA: Insufficient documentation

## 2015-04-29 HISTORY — PX: CARDIOVERSION: SHX1299

## 2015-04-29 SURGERY — CARDIOVERSION
Anesthesia: General

## 2015-04-29 MED ORDER — PROPOFOL 10 MG/ML IV BOLUS
INTRAVENOUS | Status: DC | PRN
  Administered 2015-04-29: 90 mg via INTRAVENOUS

## 2015-04-29 MED ORDER — SODIUM CHLORIDE 0.9 % IV SOLN
INTRAVENOUS | Status: DC
Start: 2015-04-29 — End: 2015-04-29
  Administered 2015-04-29: 11:00:00 via INTRAVENOUS

## 2015-04-29 MED ORDER — LIDOCAINE HCL (CARDIAC) 20 MG/ML IV SOLN
INTRAVENOUS | Status: DC | PRN
  Administered 2015-04-29: 60 mg via INTRATRACHEAL

## 2015-04-29 NOTE — Anesthesia Preprocedure Evaluation (Addendum)
Anesthesia Evaluation  Patient identified by MRN, date of birth, ID band Patient awake    Reviewed: Allergy & Precautions, Patient's Chart, lab work & pertinent test results  History of Anesthesia Complications Negative for: history of anesthetic complications  Airway Mallampati: II  TM Distance: >3 FB Neck ROM: Full    Dental no notable dental hx. (+) Dental Advisory Given   Pulmonary sleep apnea ,    Pulmonary exam normal        Cardiovascular negative cardio ROS  + dysrhythmias Atrial Fibrillation  Rhythm:Irregular Rate:Normal     Neuro/Psych PSYCHIATRIC DISORDERS Depression negative neurological ROS     GI/Hepatic negative GI ROS, Neg liver ROS,   Endo/Other  Hypothyroidism Morbid obesity  Renal/GU negative Renal ROS     Musculoskeletal   Abdominal   Peds  Hematology   Anesthesia Other Findings   Reproductive/Obstetrics                            Anesthesia Physical Anesthesia Plan  ASA: III  Anesthesia Plan: General   Post-op Pain Management:    Induction: Intravenous  Airway Management Planned: Mask  Additional Equipment:   Intra-op Plan:   Post-operative Plan:   Informed Consent: I have reviewed the patients History and Physical, chart, labs and discussed the procedure including the risks, benefits and alternatives for the proposed anesthesia with the patient or authorized representative who has indicated his/her understanding and acceptance.   Dental advisory given  Plan Discussed with: CRNA, Anesthesiologist and Surgeon  Anesthesia Plan Comments:        Anesthesia Quick Evaluation

## 2015-04-29 NOTE — Transfer of Care (Signed)
Immediate Anesthesia Transfer of Care Note  Patient: [name] [name]  Procedure(s) Performed: Procedure(s): CARDIOVERSION (N/A)  Patient Location: Endoscopy Unit  Anesthesia Type:General  Level of Consciousness: awake, alert  and oriented  Airway & Oxygen Therapy: Patient Spontanous Breathing  Post-op Assessment: Report given to RN, Post -op Vital signs reviewed and stable and Patient moving all extremities X 4  Post vital signs: Reviewed and stable  Last Vitals:  Filed Vitals:   04/29/15 1025  BP: 172/84  Pulse: 114  Temp: 36.8 C  Resp: 16   HR 76, BP 140/54(80), RR 16, Sats 95% @ 1107 Complications: No apparent anesthesia complications

## 2015-04-29 NOTE — Anesthesia Procedure Notes (Signed)
Date/Time: 04/29/2015 10:57 AM Performed by: Glo HerringLEE, Yalissa Fink B Pre-anesthesia Checklist: Patient identified, Emergency Drugs available, Suction available, Patient being monitored and Timeout performed Patient Re-evaluated:Patient Re-evaluated prior to inductionOxygen Delivery Method: Ambu bag Preoxygenation: Pre-oxygenation with 100% oxygen Intubation Type: IV induction Ventilation: Mask ventilation without difficulty Dental Injury: Teeth and Oropharynx as per pre-operative assessment

## 2015-04-29 NOTE — CV Procedure (Signed)
Direct current cardioversion:  Indication symptomatic A. Flutter  Procedure: Using 90 mg of IV Propofol and 60 IV Lidocaine (for reducing venous pain) for achieving deep sedation, synchronized direct current cardioversion performed. Patient was delivered with 75 Joules of electricity X 1 with success to NSR. Patient tolerated the procedure well. No immediate complication noted.

## 2015-04-29 NOTE — Discharge Instructions (Signed)
Electrical Cardioversion, Care After °Refer to this sheet in the next few weeks. These instructions provide you with information on caring for yourself after your procedure. Your health care provider may also give you more specific instructions. Your treatment has been planned according to current medical practices, but problems sometimes occur. Call your health care provider if you have any problems or questions after your procedure. °WHAT TO EXPECT AFTER THE PROCEDURE °After your procedure, it is typical to have the following sensations: °· Some redness on the skin where the shocks were delivered. If this is tender, a sunburn lotion or hydrocortisone cream may help. °· Possible return of an abnormal heart rhythm within hours or days after the procedure. °HOME CARE INSTRUCTIONS °· Take medicines only as directed by your health care provider. Be sure you understand how and when to take your medicine. °· Learn how to feel your pulse and check it often. °· Limit your activity for 48 hours after the procedure or as directed by your health care provider. °· Avoid or minimize caffeine and other stimulants as directed by your health care provider. °SEEK MEDICAL CARE IF: °· You feel like your heart is beating too fast or your pulse is not regular. °· You have any questions about your medicines. °· You have bleeding that will not stop. °SEEK IMMEDIATE MEDICAL CARE IF: °· You are dizzy or feel faint. °· It is hard to breathe or you feel short of breath. °· There is a change in discomfort in your chest. °· Your speech is slurred or you have trouble moving an arm or leg on one side of your body. °· You get a serious muscle cramp that does not go away. °· Your fingers or toes turn cold or blue. °  °This information is not intended to replace advice given to you by your health care provider. Make sure you discuss any questions you have with your health care provider. °  °Document Released: 02/07/2013 Document Revised: 05/10/2014  Document Reviewed: 02/07/2013 °Elsevier Interactive Patient Education ©2016 [employer] ° °

## 2015-04-29 NOTE — Anesthesia Postprocedure Evaluation (Signed)
Anesthesia Post Note  Patient: [name] [name]  Procedure(s) Performed: Procedure(s) (LRB): CARDIOVERSION (N/A)  Patient location during evaluation: PACU Anesthesia Type: General Level of consciousness: sedated Pain management: pain level controlled Vital Signs Assessment: post-procedure vital signs reviewed and stable Respiratory status: spontaneous breathing and respiratory function stable Cardiovascular status: stable Anesthetic complications: no    Last Vitals:  Filed Vitals:   04/29/15 1025 04/29/15 1106  BP: 172/84 140/54  Pulse: 114   Temp: 36.8 C 36.7 C  Resp: 16 16    Last Pain: There were no vitals filed for this visit.               Margaret Cockerill DANIEL

## 2015-04-30 ENCOUNTER — Encounter (HOSPITAL_COMMUNITY): Payer: Self-pay | Admitting: *Deleted

## 2015-04-30 ENCOUNTER — Emergency Department (HOSPITAL_COMMUNITY)
Admission: EM | Admit: 2015-04-30 | Discharge: 2015-04-30 | Disposition: A | Discharge status: Home / Self Care | Payer: PPO | Attending: Emergency Medicine | Admitting: Emergency Medicine

## 2015-04-30 ENCOUNTER — Emergency Department (HOSPITAL_COMMUNITY): Payer: PPO

## 2015-04-30 DIAGNOSIS — Z8739 Personal history of other diseases of the musculoskeletal system and connective tissue: Secondary | ICD-10-CM | POA: Diagnosis not present

## 2015-04-30 DIAGNOSIS — Z9981 Dependence on supplemental oxygen: Secondary | ICD-10-CM | POA: Diagnosis not present

## 2015-04-30 DIAGNOSIS — I4892 Unspecified atrial flutter: Secondary | ICD-10-CM | POA: Insufficient documentation

## 2015-04-30 DIAGNOSIS — G4733 Obstructive sleep apnea (adult) (pediatric): Secondary | ICD-10-CM | POA: Insufficient documentation

## 2015-04-30 DIAGNOSIS — Z8709 Personal history of other diseases of the respiratory system: Secondary | ICD-10-CM | POA: Insufficient documentation

## 2015-04-30 DIAGNOSIS — Z79899 Other long term (current) drug therapy: Secondary | ICD-10-CM | POA: Diagnosis not present

## 2015-04-30 DIAGNOSIS — E039 Hypothyroidism, unspecified: Secondary | ICD-10-CM | POA: Diagnosis not present

## 2015-04-30 DIAGNOSIS — Z8659 Personal history of other mental and behavioral disorders: Secondary | ICD-10-CM | POA: Insufficient documentation

## 2015-04-30 DIAGNOSIS — R0602 Shortness of breath: Secondary | ICD-10-CM | POA: Diagnosis present

## 2015-04-30 DIAGNOSIS — E8779 Other fluid overload: Secondary | ICD-10-CM | POA: Insufficient documentation

## 2015-04-30 LAB — I-STAT TROPONIN, ED: Troponin i, poc: 0 ng/mL (ref 0.00–0.08)

## 2015-04-30 LAB — BASIC METABOLIC PANEL
Anion gap: 8 (ref 5–15)
BUN: 15 mg/dL (ref 6–20)
CO2: 23 mmol/L (ref 22–32)
Calcium: 9.1 mg/dL (ref 8.9–10.3)
Chloride: 109 mmol/L (ref 101–111)
Creatinine, Ser: 0.85 mg/dL (ref 0.44–1.00)
GFR calc Af Amer: >60 mL/min (ref >60)
GFR calc non Af Amer: >60 mL/min (ref >60)
Glucose, Bld: 121 mg/dL — ABNORMAL HIGH (ref 65–99)
Potassium: 4.3 mmol/L (ref 3.5–5.1)
Sodium: 140 mmol/L (ref 135–145)

## 2015-04-30 LAB — CBC WITH DIFFERENTIAL/PLATELET
Basophils Absolute: 0 10*3/uL (ref 0.0–0.1)
Basophils Relative: 1 %
Eosinophils Absolute: 0.2 10*3/uL (ref 0.0–0.7)
Eosinophils Relative: 2 %
HCT: 36.2 % (ref 36.0–46.0)
Hemoglobin: 11.6 g/dL — ABNORMAL LOW (ref 12.0–15.0)
Lymphocytes Relative: 14 %
Lymphs Abs: 1 10*3/uL (ref 0.7–4.0)
MCH: 29.5 pg (ref 26.0–34.0)
MCHC: 32 g/dL (ref 30.0–36.0)
MCV: 92.1 fL (ref 78.0–100.0)
Monocytes Absolute: 0.5 10*3/uL (ref 0.1–1.0)
Monocytes Relative: 6 %
Neutro Abs: 5.7 10*3/uL (ref 1.7–7.7)
Neutrophils Relative %: 77 %
Platelets: 158 10*3/uL (ref 150–400)
RBC: 3.93 MIL/uL (ref 3.87–5.11)
RDW: 13.6 % (ref 11.5–15.5)
WBC: 7.4 10*3/uL (ref 4.0–10.5)

## 2015-04-30 LAB — BRAIN NATRIURETIC PEPTIDE: B Natriuretic Peptide: 92.6 pg/mL (ref 0.0–100.0)

## 2015-04-30 MED ORDER — TRIAMTERENE-HCTZ 37.5-25 MG PO CAPS: 1.0000 | ORAL_CAPSULE | Freq: Every day | ORAL | Status: DC

## 2015-04-30 MED ORDER — FUROSEMIDE 10 MG/ML IJ SOLN
40.0000 mg | Freq: Once | INTRAMUSCULAR | Status: AC
  Administered 2015-04-30: 40 mg via INTRAVENOUS
  Filled 2015-04-30: qty 4

## 2015-04-30 NOTE — Discharge Instructions (Signed)

## 2015-04-30 NOTE — ED Provider Notes (Signed)
CSN: [id number]     Arrival date & time 04/30/15  0701 History   First MD Initiated Contact with Patient 04/30/15 [phone]     Chief Complaint  Patient presents with  . Shortness of Breath     (Consider location/radiation/quality/duration/timing/severity/associated sxs/prior Treatment) Patient is a 64 y.o. female presenting with cough. The history is provided by the patient.  Cough Cough characteristics:  Non-productive Severity:  Mild Onset quality:  Gradual Duration:  2 weeks Timing:  Constant Progression:  Unchanged Chronicity:  New Smoker: no   Relieved by:  Nothing Worsened by:  Nothing tried Ineffective treatments:  None tried Associated symptoms: shortness of breath (with coughing today)   Associated symptoms: no chest pain, no chills and no fever   Associated symptoms comment:  Mucous and throat drainage   Past Medical History  Diagnosis Date  . REM sleep behavior disorder   . Anxiety and depression   . Unspecified hypothyroidism   . Periodic limb movement disorder   . Rhinitis   . Exogenous obesity   . SOB (shortness of breath)   . Morbid obesity (HCC)   . Fibromyalgia   . Hyperlipidemia   . Hypothyroidism   . Spinal stenosis   . Paroxysmal atrial flutter (HCC)   . OSA (obstructive sleep apnea)     uses CPAP   Past Surgical History  Procedure Laterality Date  . Left rotator cuff    . Carpal tunnel release      Bilateral  . Dilation and curettage of uterus    . Resection uterine polyp  2011   Family History  Problem Relation Age of Onset  . Adopted: Yes  . Cervical cancer Mother    Social History  Substance Use Topics  . Smoking status: Never Smoker   . Smokeless tobacco: None  . Alcohol Use: No   OB History    No data available     Review of Systems  Constitutional: Negative for fever and chills.  Respiratory: Positive for cough and shortness of breath (with coughing today).   Cardiovascular: Negative for chest pain.  All other systems  reviewed and are negative.     Allergies  Codeine and Morphine  Home Medications   Prior to Admission medications   Medication Sig Start Date End Date Taking? Authorizing Provider  dabigatran (PRADAXA) 150 MG CAPS capsule Take 150 mg by mouth 2 (two) times daily.    Historical Provider, MD  diltiazem (CARDIZEM CD) 120 MG 24 hr capsule Take 120 mg by mouth daily. 04/08/15   Historical Provider, MD  levothyroxine (SYNTHROID, LEVOTHROID) 200 MCG tablet Take 200 mcg by mouth daily.    Historical Provider, MD  metoprolol tartrate (LOPRESSOR) 25 MG tablet Take 25 mg by mouth 3 (three) times daily.    Historical Provider, MD   BP 140/54 mmHg  Pulse 78  Temp(Src) 97.7 F (36.5 C)  Resp 18  SpO2 98% Physical Exam  Constitutional: She is oriented to person, place, and time. She appears well-developed and well-nourished. No distress.  HENT:  Head: Normocephalic.  Eyes: Conjunctivae are normal.  Neck: Neck supple. No tracheal deviation present.  Cardiovascular: Normal rate, regular rhythm and normal heart sounds.   Pulmonary/Chest: Effort normal and breath sounds normal. No respiratory distress. She has no wheezes. She has no rales. She exhibits no tenderness.  Abdominal: Soft. She exhibits no distension. There is no tenderness.  Neurological: She is alert and oriented to person, place, and time.  Skin: Skin is warm  and dry.  Psychiatric: She has a normal mood and affect.  Vitals reviewed.   ED Course  Procedures (including critical care time) Labs Review Labs Reviewed  CBC WITH DIFFERENTIAL/PLATELET - Abnormal; Notable for the following:    Hemoglobin 11.6 (*)    All other components within normal limits  BASIC METABOLIC PANEL - Abnormal; Notable for the following:    Glucose, Bld 121 (*)    All other components within normal limits  BRAIN NATRIURETIC PEPTIDE  I-STAT TROPOININ, ED    Imaging Review Dg Chest 2 View  04/30/2015  CLINICAL DATA:  Cough, shortness of breath,  chest tightness for 2 weeks worst today, atrial flutter post cardioversion on 04/29/2015 EXAM: CHEST  2 VIEW COMPARISON:  07/26/2003 FINDINGS: Enlargement of cardiac silhouette with pulmonary vascular congestion. Scattered mild interstitial infiltrates throughout both lungs likely representing pulmonary edema and CHF. Atherosclerotic calcification aortic arch. Tiny bibasilar pleural effusions blunt the posterior costophrenic angles. No acute osseous findings. BILATERAL cervical ribs. IMPRESSION: Enlargement of cardiac silhouette with pulmonary vascular congestion and mild pulmonary edema. Electronically Signed   By: Ulyses Southward M.D.   On: 04/30/2015 08:16   I have personally reviewed and evaluated these images and lab results as part of my medical decision-making.   EKG Interpretation   Date/Time:  Wednesday April 30 2015 07:12:57 EST Ventricular Rate:  84 PR Interval:  216 QRS Duration: 90 QT Interval:  376 QTC Calculation: 444 R Axis:   66 Text Interpretation:  Sinus rhythm with 1st degree A-V block Abnormal ekg  Atrial fibrillation RESOLVED SINCE PREVIOUS Confirmed by Jasher Barkan MD, Reuel Boom  ([id number]) on 04/30/2015 7:55:28 AM      MDM   Final diagnoses:  Other fluid overload    64 y.o. female presents with shortness of breath after having "congestion" for past week. Had cardioversion yesterday and noted worse shortness of breath today. Has mild pulmonary edema but no increased WOB or oxygen requirement here. D/w Dr Jacinto Halim after lasix given and Pt able to diurese 1L in ED. He recommended triamterene HCTZ at home and f/u in 1 week as scheduled with him. Findings are likely related to ongoing previous Afib with lack of ischemic findings on EKG and now in sinus rhythm. Plan to follow up with PCP as needed and return precautions discussed for worsening or new concerning symptoms.     Lyndal Pulley, MD 04/30/15 2150

## 2015-04-30 NOTE — ED Notes (Addendum)
Pt reports being seen yesterday for a cardioversion for a. Flutter. Pt states that since she has has SOB and congestion for approx 2 weeks. States that it worsened this morning.  NAD noted in triage.

## 2015-05-08 DIAGNOSIS — R002 Palpitations: Secondary | ICD-10-CM | POA: Diagnosis not present

## 2015-05-08 DIAGNOSIS — I5032 Chronic diastolic (congestive) heart failure: Secondary | ICD-10-CM | POA: Diagnosis not present

## 2015-05-08 DIAGNOSIS — I484 Atypical atrial flutter: Secondary | ICD-10-CM | POA: Diagnosis not present

## 2015-05-08 DIAGNOSIS — R0602 Shortness of breath: Secondary | ICD-10-CM | POA: Diagnosis not present

## 2015-05-25 DIAGNOSIS — J329 Chronic sinusitis, unspecified: Secondary | ICD-10-CM | POA: Diagnosis not present

## 2015-05-28 ENCOUNTER — Ambulatory Visit (HOSPITAL_BASED_OUTPATIENT_CLINIC_OR_DEPARTMENT_OTHER): Payer: PPO | Attending: Internal Medicine | Admitting: *Deleted

## 2015-05-28 VITALS — Ht 66.0 in | Wt 263.0 lb

## 2015-05-28 DIAGNOSIS — G4733 Obstructive sleep apnea (adult) (pediatric): Secondary | ICD-10-CM | POA: Diagnosis not present

## 2015-05-28 DIAGNOSIS — R0683 Snoring: Secondary | ICD-10-CM | POA: Diagnosis not present

## 2015-05-28 DIAGNOSIS — G478 Other sleep disorders: Secondary | ICD-10-CM | POA: Diagnosis not present

## 2015-06-04 ENCOUNTER — Telehealth: Payer: Self-pay | Admitting: Cardiology

## 2015-06-04 DIAGNOSIS — G478 Other sleep disorders: Secondary | ICD-10-CM | POA: Insufficient documentation

## 2015-06-04 NOTE — Telephone Encounter (Signed)
Please let patient know that sleep study showed no significant sleep apnea.    

## 2015-06-04 NOTE — Sleep Study (Signed)
   Patient Name: [name] [name], [name] [name] MRN: [id number] Study Date: 05/28/2015 Gender: Female D.O.B: 24-May-1950 Age (years): 63 Referring Provider: Sherryl Manges Interpreting Physician: Armanda Magic MD, ABSM RPSGT: Neeriemer, Holly  Weight (lbs): 263 BMI: 42 Height (inches): 66 Neck Size: 14.00  CLINICAL INFORMATION Sleep Study Type: NPSG Indication for sleep study: OSA Epworth Sleepiness Score: 1  SLEEP STUDY TECHNIQUE As per the AASM Manual for the Scoring of Sleep and Associated Events v2.3 (April 2016) with a hypopnea requiring 4% desaturations. The channels recorded and monitored were frontal, central and occipital EEG, electrooculogram (EOG), submentalis EMG (chin), nasal and oral airflow, thoracic and abdominal wall motion, anterior tibialis EMG, snore microphone, electrocardiogram, and pulse oximetry.  MEDICATIONS Patient's medications include: Pradaxa, Cardizem, Synthroid, Metoprolol,  Dyazide. Medications self-administered by patient during sleep study : No sleep medicine administered.  SLEEP ARCHITECTURE The study was initiated at 10:51:00 PM and ended at 5:00:45 AM. Sleep onset time was 42.9 minutes and the sleep efficiency was reduced at 61.9%. The total sleep time was 229.0 minutes. Stage REM latency was prolonged at 157.0 minutes. The patient spent 9.17% of the night in stage N1 sleep, 75.55% in stage N2 sleep, 0.00% in stage N3 and 15.28% in REM. Alpha intrusion was absent. Supine sleep was 100.00%.  RESPIRATORY PARAMETERS The overall apnea/hypopnea index (AHI) was 0.5 per hour. There were 0 total apneas, including 0 obstructive, 0 central and 0 mixed apneas. There were 2 hypopneas and 2 RERAs. The AHI during Stage REM sleep was 0.0 per hour. AHI while supine was 0.5 per hour. The mean oxygen saturation was 91.81%. The minimum SpO2 during sleep was 89.00%. Soft snoring was noted during this study.  CARDIAC DATA The 2 lead EKG demonstrated sinus rhythm. The mean  heart rate was 75.98 beats per minute. Other EKG findings include: None.  LEG MOVEMENT DATA  The total PLMS were 0 with a resulting PLMS index of 0.00. Associated arousal with leg movement index was 0.0 .  IMPRESSIONS - No significant obstructive sleep apnea occurred during this study (AHI = 0.5/h). - No significant central sleep apnea occurred during this study (CAI = 0.0/h). - Mild oxygen desaturation was noted during this study (Min O2 = 89.00%). - The patient snored with Soft snoring volume. - No cardiac abnormalities were noted during this study. - Clinically significant periodic limb movements did not occur during sleep. No significant associated arousals.  RECOMMENDATIONS - Avoid alcohol, sedatives and other CNS depressants that may worsen sleep apnea and disrupt normal sleep architecture. - Sleep hygiene should be reviewed to assess factors that may improve sleep quality. - Weight management and regular exercise should be initiated or continued if appropriate.  Quintella Reichert Diplomate, American Board of Sleep Medicine  ELECTRONICALLY SIGNED ON:  06/04/2015, 3:06 PM [facility] PH: [phone]   FX: [phone] ACCREDITED BY THE AMERICAN ACADEMY OF SLEEP MEDICINE

## 2015-06-06 NOTE — Telephone Encounter (Signed)
Patient is informed of information... Instructed her to call if she had any further questions or concerns.

## 2015-07-14 DIAGNOSIS — R5381 Other malaise: Secondary | ICD-10-CM | POA: Diagnosis not present

## 2015-07-14 DIAGNOSIS — E038 Other specified hypothyroidism: Secondary | ICD-10-CM | POA: Diagnosis not present

## 2015-07-14 DIAGNOSIS — Z6841 Body Mass Index (BMI) 40.0 and over, adult: Secondary | ICD-10-CM | POA: Diagnosis not present

## 2015-07-14 DIAGNOSIS — I4892 Unspecified atrial flutter: Secondary | ICD-10-CM | POA: Diagnosis not present

## 2015-07-28 DIAGNOSIS — E7800 Pure hypercholesterolemia, unspecified: Secondary | ICD-10-CM | POA: Diagnosis not present

## 2015-07-28 DIAGNOSIS — I5032 Chronic diastolic (congestive) heart failure: Secondary | ICD-10-CM | POA: Diagnosis not present

## 2015-08-02 DIAGNOSIS — L239 Allergic contact dermatitis, unspecified cause: Secondary | ICD-10-CM | POA: Diagnosis not present

## 2015-08-06 DIAGNOSIS — E7800 Pure hypercholesterolemia, unspecified: Secondary | ICD-10-CM | POA: Diagnosis not present

## 2015-08-06 DIAGNOSIS — R0602 Shortness of breath: Secondary | ICD-10-CM | POA: Diagnosis not present

## 2015-08-06 DIAGNOSIS — I484 Atypical atrial flutter: Secondary | ICD-10-CM | POA: Diagnosis not present

## 2015-08-10 DIAGNOSIS — L237 Allergic contact dermatitis due to plants, except food: Secondary | ICD-10-CM | POA: Diagnosis not present

## 2015-08-27 DIAGNOSIS — G4733 Obstructive sleep apnea (adult) (pediatric): Secondary | ICD-10-CM | POA: Diagnosis not present

## 2015-08-27 DIAGNOSIS — Z1389 Encounter for screening for other disorder: Secondary | ICD-10-CM | POA: Diagnosis not present

## 2015-08-27 DIAGNOSIS — E784 Other hyperlipidemia: Secondary | ICD-10-CM | POA: Diagnosis not present

## 2015-08-27 DIAGNOSIS — R000 Tachycardia, unspecified: Secondary | ICD-10-CM | POA: Diagnosis not present

## 2015-08-27 DIAGNOSIS — R5381 Other malaise: Secondary | ICD-10-CM | POA: Diagnosis not present

## 2015-08-27 DIAGNOSIS — M797 Fibromyalgia: Secondary | ICD-10-CM | POA: Diagnosis not present

## 2015-08-27 DIAGNOSIS — Z6841 Body Mass Index (BMI) 40.0 and over, adult: Secondary | ICD-10-CM | POA: Diagnosis not present

## 2015-08-27 DIAGNOSIS — E038 Other specified hypothyroidism: Secondary | ICD-10-CM | POA: Diagnosis not present

## 2015-08-27 DIAGNOSIS — F419 Anxiety disorder, unspecified: Secondary | ICD-10-CM | POA: Diagnosis not present

## 2015-08-27 DIAGNOSIS — I4892 Unspecified atrial flutter: Secondary | ICD-10-CM | POA: Diagnosis not present

## 2015-11-26 DIAGNOSIS — H5203 Hypermetropia, bilateral: Secondary | ICD-10-CM | POA: Diagnosis not present

## 2016-02-04 DIAGNOSIS — I484 Atypical atrial flutter: Secondary | ICD-10-CM | POA: Diagnosis not present

## 2016-02-04 DIAGNOSIS — E7800 Pure hypercholesterolemia, unspecified: Secondary | ICD-10-CM | POA: Diagnosis not present

## 2016-02-04 DIAGNOSIS — R5381 Other malaise: Secondary | ICD-10-CM | POA: Diagnosis not present

## 2016-02-17 DIAGNOSIS — R8299 Other abnormal findings in urine: Secondary | ICD-10-CM | POA: Diagnosis not present

## 2016-02-17 DIAGNOSIS — N390 Urinary tract infection, site not specified: Secondary | ICD-10-CM | POA: Diagnosis not present

## 2016-02-17 DIAGNOSIS — E038 Other specified hypothyroidism: Secondary | ICD-10-CM | POA: Diagnosis not present

## 2016-02-17 DIAGNOSIS — E784 Other hyperlipidemia: Secondary | ICD-10-CM | POA: Diagnosis not present

## 2016-02-20 DIAGNOSIS — M797 Fibromyalgia: Secondary | ICD-10-CM | POA: Diagnosis not present

## 2016-02-20 DIAGNOSIS — Z1212 Encounter for screening for malignant neoplasm of rectum: Secondary | ICD-10-CM | POA: Diagnosis not present

## 2016-02-20 DIAGNOSIS — F418 Other specified anxiety disorders: Secondary | ICD-10-CM | POA: Diagnosis not present

## 2016-02-20 DIAGNOSIS — Z23 Encounter for immunization: Secondary | ICD-10-CM | POA: Diagnosis not present

## 2016-02-20 DIAGNOSIS — E784 Other hyperlipidemia: Secondary | ICD-10-CM | POA: Diagnosis not present

## 2016-02-20 DIAGNOSIS — G4733 Obstructive sleep apnea (adult) (pediatric): Secondary | ICD-10-CM | POA: Diagnosis not present

## 2016-02-20 DIAGNOSIS — R000 Tachycardia, unspecified: Secondary | ICD-10-CM | POA: Diagnosis not present

## 2016-02-20 DIAGNOSIS — Z1321 Encounter for screening for nutritional disorder: Secondary | ICD-10-CM | POA: Diagnosis not present

## 2016-02-20 DIAGNOSIS — Z0000 Encounter for general adult medical examination without abnormal findings: Secondary | ICD-10-CM | POA: Diagnosis not present

## 2016-02-20 DIAGNOSIS — Z6841 Body Mass Index (BMI) 40.0 and over, adult: Secondary | ICD-10-CM | POA: Diagnosis not present

## 2016-04-05 DIAGNOSIS — Z1231 Encounter for screening mammogram for malignant neoplasm of breast: Secondary | ICD-10-CM | POA: Diagnosis not present

## 2016-04-05 DIAGNOSIS — Z6841 Body Mass Index (BMI) 40.0 and over, adult: Secondary | ICD-10-CM | POA: Diagnosis not present

## 2016-04-05 DIAGNOSIS — N9089 Other specified noninflammatory disorders of vulva and perineum: Secondary | ICD-10-CM | POA: Diagnosis not present

## 2016-04-05 DIAGNOSIS — Z01419 Encounter for gynecological examination (general) (routine) without abnormal findings: Secondary | ICD-10-CM | POA: Diagnosis not present

## 2016-07-28 DIAGNOSIS — R5381 Other malaise: Secondary | ICD-10-CM | POA: Diagnosis not present

## 2016-08-09 DIAGNOSIS — R5381 Other malaise: Secondary | ICD-10-CM | POA: Diagnosis not present

## 2016-08-09 DIAGNOSIS — R5383 Other fatigue: Secondary | ICD-10-CM | POA: Diagnosis not present

## 2016-08-09 DIAGNOSIS — E7800 Pure hypercholesterolemia, unspecified: Secondary | ICD-10-CM | POA: Diagnosis not present

## 2016-08-09 DIAGNOSIS — I484 Atypical atrial flutter: Secondary | ICD-10-CM | POA: Diagnosis not present

## 2016-08-16 DIAGNOSIS — I484 Atypical atrial flutter: Secondary | ICD-10-CM | POA: Diagnosis not present

## 2016-08-16 DIAGNOSIS — R002 Palpitations: Secondary | ICD-10-CM | POA: Diagnosis not present

## 2016-12-16 IMAGING — CR DG CHEST 2V
2 series · 2 of 2 positions shown · non-contrast
Comparison: 07/26/2003

CLINICAL DATA: Cough, shortness of breath, chest tightness for 2
weeks worst today, atrial flutter post cardioversion on 04/29/2015

EXAM:
CHEST  2 VIEW

[chest pa]
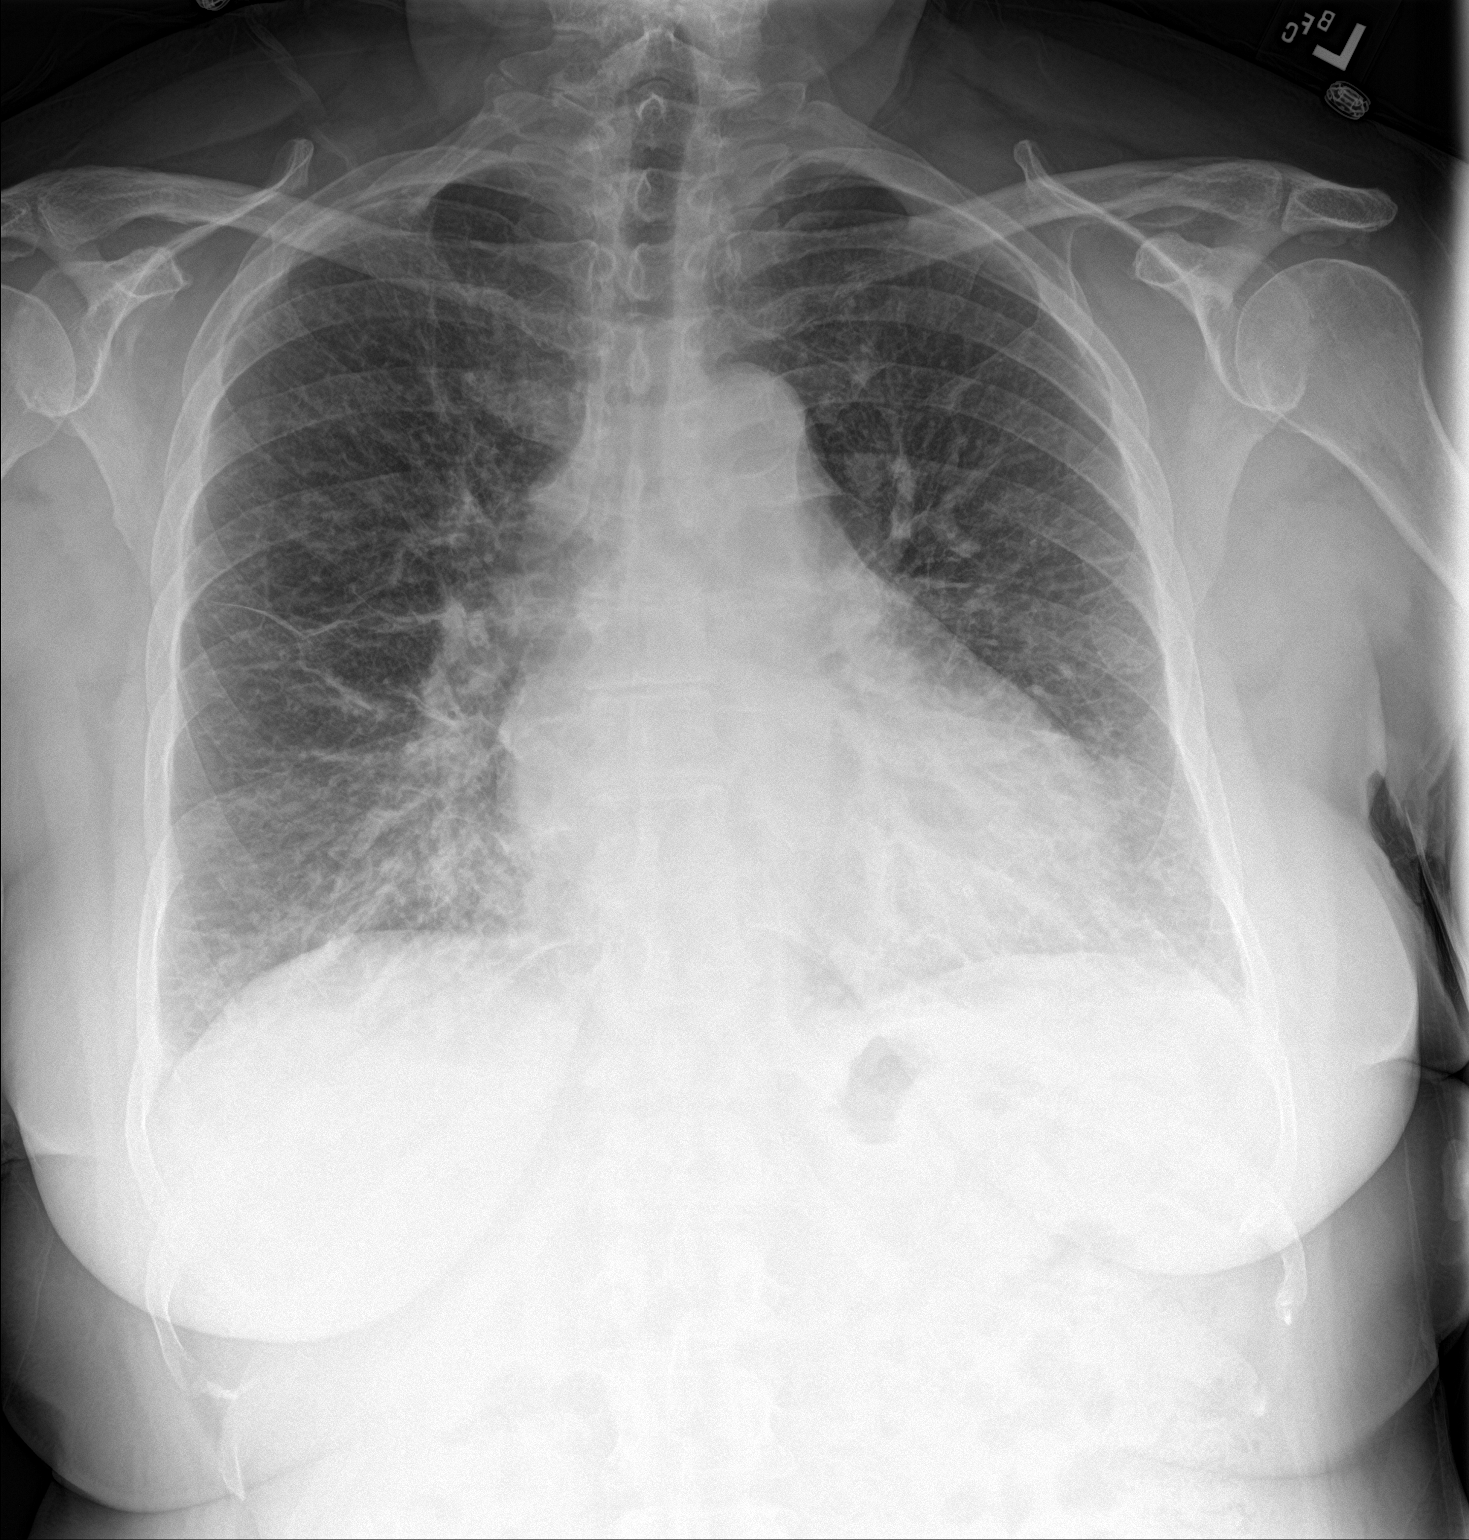

[chest lat]
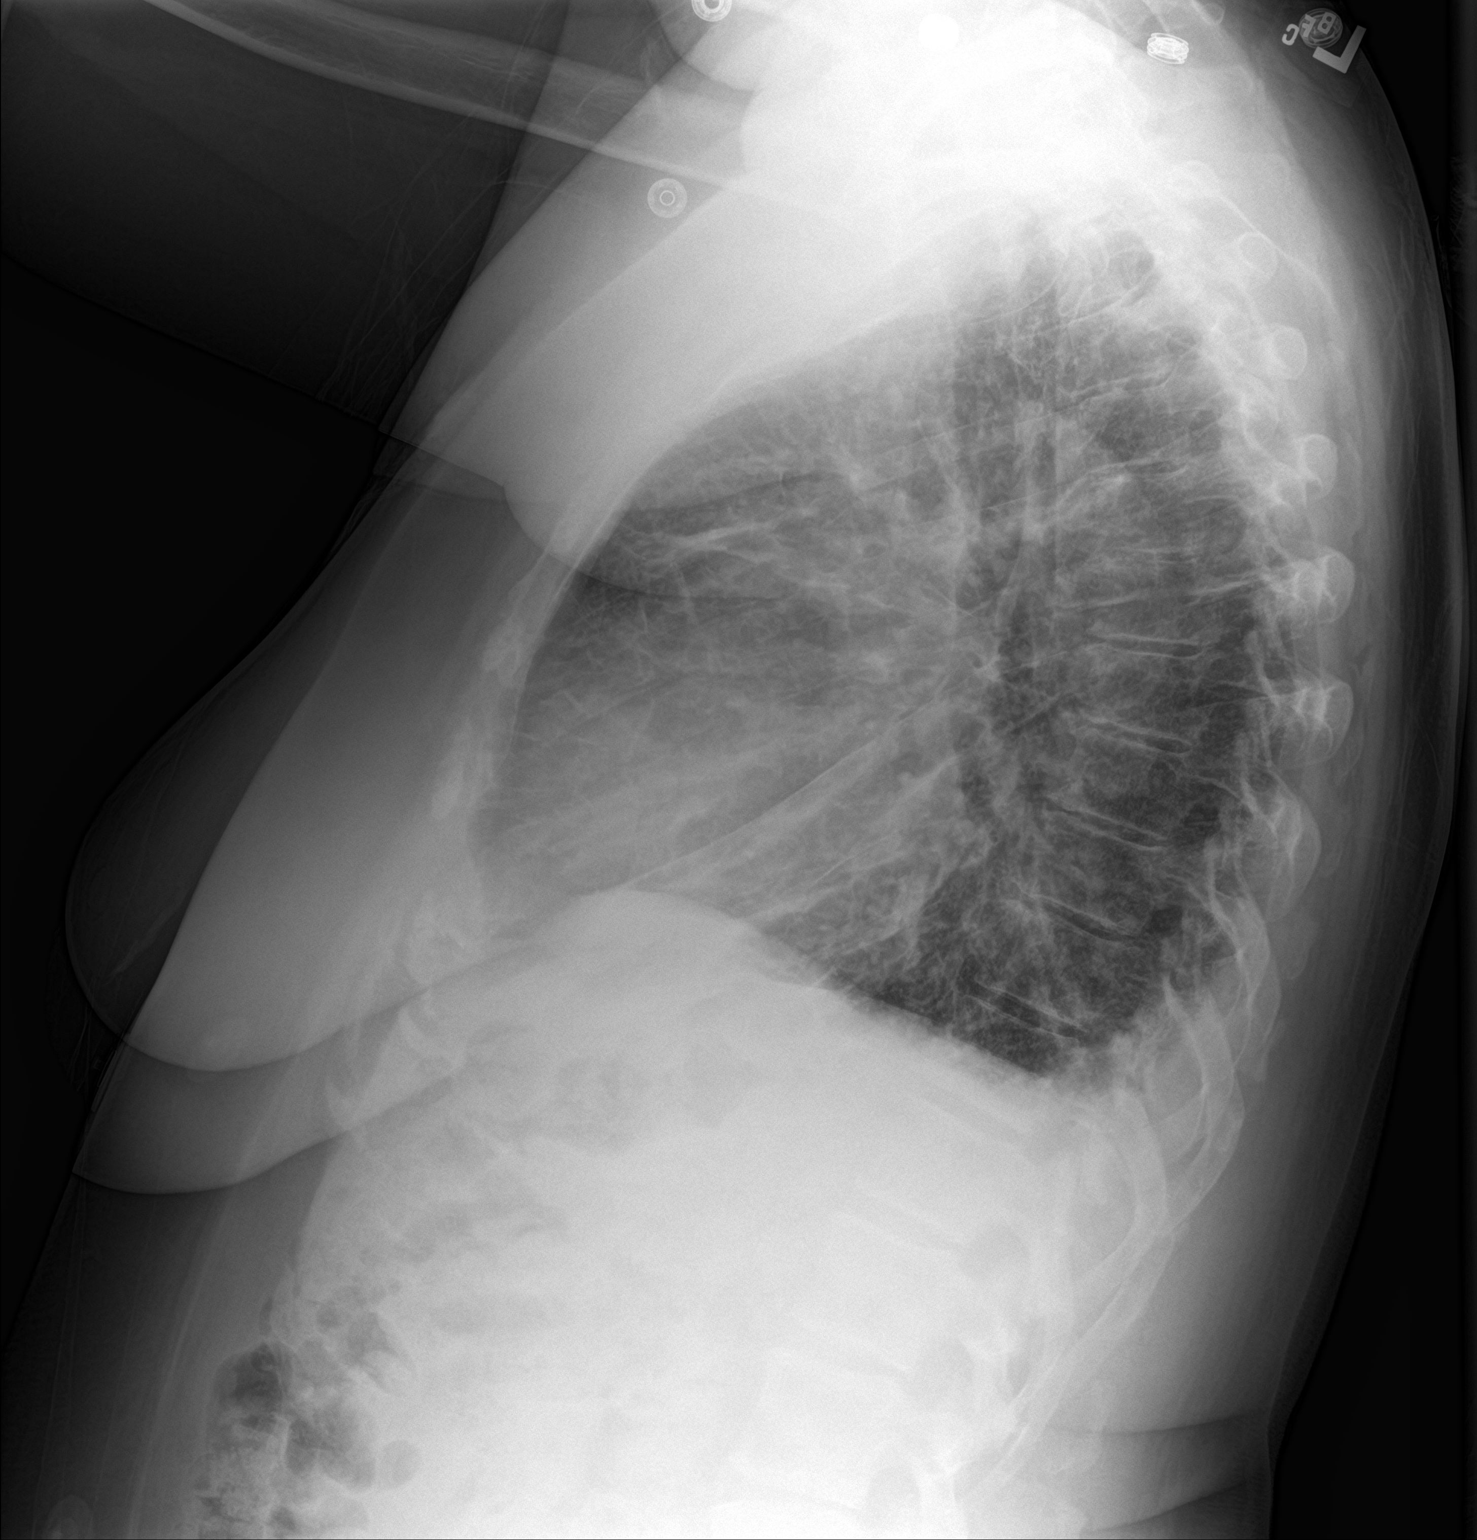

[2 of 2 positions shown; findings below may reference images not displayed]

FINDINGS: Enlargement of cardiac silhouette with pulmonary vascular
congestion.

Scattered mild interstitial infiltrates throughout both lungs likely
representing pulmonary edema and CHF.

Atherosclerotic calcification aortic arch.

Tiny bibasilar pleural effusions blunt the posterior costophrenic
angles.

No acute osseous findings.

BILATERAL cervical ribs.
IMPRESSION: Enlargement of cardiac silhouette with pulmonary vascular congestion
and mild pulmonary edema.

## 2016-12-31 DIAGNOSIS — H5203 Hypermetropia, bilateral: Secondary | ICD-10-CM | POA: Diagnosis not present

## 2017-02-09 DIAGNOSIS — R030 Elevated blood-pressure reading, without diagnosis of hypertension: Secondary | ICD-10-CM | POA: Diagnosis not present

## 2017-02-09 DIAGNOSIS — I484 Atypical atrial flutter: Secondary | ICD-10-CM | POA: Diagnosis not present

## 2017-02-16 DIAGNOSIS — E7849 Other hyperlipidemia: Secondary | ICD-10-CM | POA: Diagnosis not present

## 2017-02-16 DIAGNOSIS — Z0000 Encounter for general adult medical examination without abnormal findings: Secondary | ICD-10-CM | POA: Diagnosis not present

## 2017-02-16 DIAGNOSIS — E038 Other specified hypothyroidism: Secondary | ICD-10-CM | POA: Diagnosis not present

## 2017-02-25 DIAGNOSIS — R531 Weakness: Secondary | ICD-10-CM | POA: Diagnosis not present

## 2017-02-25 DIAGNOSIS — E7849 Other hyperlipidemia: Secondary | ICD-10-CM | POA: Diagnosis not present

## 2017-02-25 DIAGNOSIS — F418 Other specified anxiety disorders: Secondary | ICD-10-CM | POA: Diagnosis not present

## 2017-02-25 DIAGNOSIS — E038 Other specified hypothyroidism: Secondary | ICD-10-CM | POA: Diagnosis not present

## 2017-02-25 DIAGNOSIS — G4733 Obstructive sleep apnea (adult) (pediatric): Secondary | ICD-10-CM | POA: Diagnosis not present

## 2017-02-25 DIAGNOSIS — Z23 Encounter for immunization: Secondary | ICD-10-CM | POA: Diagnosis not present

## 2017-02-25 DIAGNOSIS — M5416 Radiculopathy, lumbar region: Secondary | ICD-10-CM | POA: Diagnosis not present

## 2017-02-25 DIAGNOSIS — Z0000 Encounter for general adult medical examination without abnormal findings: Secondary | ICD-10-CM | POA: Diagnosis not present

## 2017-02-25 DIAGNOSIS — Z1389 Encounter for screening for other disorder: Secondary | ICD-10-CM | POA: Diagnosis not present

## 2017-02-25 DIAGNOSIS — Z6841 Body Mass Index (BMI) 40.0 and over, adult: Secondary | ICD-10-CM | POA: Diagnosis not present

## 2017-02-25 DIAGNOSIS — R000 Tachycardia, unspecified: Secondary | ICD-10-CM | POA: Diagnosis not present

## 2017-02-25 DIAGNOSIS — M797 Fibromyalgia: Secondary | ICD-10-CM | POA: Diagnosis not present

## 2017-02-28 DIAGNOSIS — Z1212 Encounter for screening for malignant neoplasm of rectum: Secondary | ICD-10-CM | POA: Diagnosis not present

## 2017-03-08 ENCOUNTER — Ambulatory Visit: Payer: PPO | Admitting: Physical Therapy

## 2017-03-11 ENCOUNTER — Encounter: Payer: Self-pay | Admitting: Physical Therapy

## 2017-03-11 ENCOUNTER — Ambulatory Visit: Payer: PPO | Attending: Internal Medicine | Admitting: Physical Therapy

## 2017-03-11 DIAGNOSIS — R2681 Unsteadiness on feet: Secondary | ICD-10-CM | POA: Diagnosis not present

## 2017-03-11 DIAGNOSIS — R252 Cramp and spasm: Secondary | ICD-10-CM | POA: Diagnosis not present

## 2017-03-11 DIAGNOSIS — G8929 Other chronic pain: Secondary | ICD-10-CM | POA: Diagnosis not present

## 2017-03-11 DIAGNOSIS — M6281 Muscle weakness (generalized): Secondary | ICD-10-CM | POA: Insufficient documentation

## 2017-03-11 DIAGNOSIS — M545 Low back pain: Secondary | ICD-10-CM | POA: Insufficient documentation

## 2017-03-11 NOTE — Therapy (Signed)
[facility] [address] [place], [place], [address] Phone: [phone]   Fax:  [phone]  Physical Therapy Evaluation  Patient Details  Name: [name] [name] MRN: [id number] Date of Birth: 07/28/1950 Referring Provider: Geoffry ParadiseAronson, Richard, MD   Encounter Date: 03/11/2017  PT End of Session - 03/11/17 1154    Visit Number  1    Number of Visits  13    Date for PT Re-Evaluation  04/22/17    Authorization Type  MCR: Kx mod by 15th visit, Progress note by 10th visit.     PT Start Time  1102    PT Stop Time  1153    PT Time Calculation (min)  51 min    Activity Tolerance  Patient tolerated treatment well    Behavior During Therapy  WFL for tasks assessed/performed       Past Medical History:  Diagnosis Date  . Anxiety and depression   . Exogenous obesity   . Fibromyalgia   . Hyperlipidemia   . Hypothyroidism   . Morbid obesity (HCC)   . OSA (obstructive sleep apnea)    uses CPAP  . Paroxysmal atrial flutter (HCC)   . Periodic limb movement disorder   . REM sleep behavior disorder   . Rhinitis   . SOB (shortness of breath)   . Spinal stenosis   . Unspecified hypothyroidism     Past Surgical History:  Procedure Laterality Date  . CARPAL TUNNEL RELEASE     Bilateral  . DILATION AND CURETTAGE OF UTERUS    . left rotator cuff    . resection uterine polyp  2011    There were no vitals filed for this visit.   Subjective Assessment - 03/11/17 1114    Subjective  pt is 66 y.o F with CC of intermittent low back pain that started over 20 years ago which she is unsure specifically what caused it but it oculd be due to slipping on ice. She reports having intermittent flare ups with the most recent being 1-2 months ago that lasts about a month.  reports pain started in the middle and has been predominatley on the L side. reports pain that goes only down the RLE into the foot. pt denies red flag symptoms.      Limitations   Standing;Walking    How long can you sit comfortably?  10-15 min    How long can you stand comfortably?  an hour or more sometimes    How long can you walk comfortably?  an hour or more sometimes    Diagnostic tests  nothing recent    Patient Stated Goals  to know what to do with flare ups, decrease pain,     Currently in Pain?  Yes    Pain Score  -- pt has difficulty stating number of pain    Pain Location  Back    Pain Orientation  Right;Left;Lower;Mid    Pain Radiating Towards  to the R knee     Pain Onset  More than a month ago    Pain Frequency  Intermittent    Aggravating Factors   prolonged standing/ walking    Pain Relieving Factors  heat pad, sitting down         [facility] PT Assessment - 03/11/17 1123      Assessment   Medical Diagnosis  low back pain with radiculopathy    Referring Provider  Geoffry ParadiseAronson, Richard, MD    Onset Date/Surgical Date  --  over 20 years    Hand Dominance  Right    Next MD Visit  april 2019    Prior Therapy  yes      Precautions   Precautions  None      Restrictions   Weight Bearing Restrictions  No      Balance Screen   Has the patient fallen in the past 6 months  No    Has the patient had a decrease in activity level because of a fear of falling?   Yes    Is the patient reluctant to leave their home because of a fear of falling?   No      Home Environment   Living Environment  Private residence    Living Arrangements  Spouse/significant other    Available Help at Discharge  Family;Available PRN/intermittently    Type of Home  House    Home Access  Stairs to enter    Entrance Stairs-Number of Steps  6    Entrance Stairs-Rails  Right    Home Layout  One level      Prior Function   Level of Independence  Independent;Independent with basic ADLs    Vocation  Retired      Copy Status  Within Functional Limits for tasks assessed      Observation/Other Assessments   Focus on Therapeutic Outcomes (FOTO)   56%  limited predicted 41% limited      Posture/Postural Control   Posture/Postural Control  Postural limitations    Postural Limitations  Rounded Shoulders;Forward head      ROM / Strength   AROM / PROM / Strength  AROM;Strength;PROM      AROM   AROM Assessment Site  Lumbar    Lumbar Flexion  70    Lumbar Extension  10    Lumbar - Right Side Bend  20    Lumbar - Left Side Bend  20      Strength   Strength Assessment Site  Hip;Knee    Right/Left Hip  Right;Left    Right Hip Flexion  3+/5    Right Hip Extension  3+/5    Right Hip ABduction  3+/5    Right Hip ADduction  4-/5    Left Hip Flexion  4/5    Left Hip Extension  3+/5    Left Hip ABduction  3+/5    Left Hip ADduction  4-/5    Right/Left Knee  Right;Left    Right Knee Flexion  4+/5    Right Knee Extension  4+/5    Left Knee Flexion  5/5    Left Knee Extension  5/5      Palpation   Palpation comment  TTP at the R SIJ             Objective measurements completed on examination: See above findings.      [facility] Adult PT Treatment/Exercise - 03/11/17 1123      Lumbar Exercises: Stretches   Lower Trunk Rotation  -- 2 x 10    Pelvic Tilt  -- 2 x 10 with ADIM      Knee/Hip Exercises: Stretches   Active Hamstring Stretch  3 reps;30 seconds contract/ relax with 10 sec contraction      Manual Therapy   Manual Therapy  Muscle Energy Technique    Muscle Energy Technique  resisted R hip flexion and L hip extension 5 x 10 sec  PT Education - 03/11/17 1153    Education provided  Yes    Education Details  evaluation findings, POC, goals, HEP with proper form, anatomy of area involved.    Person(s) Educated  Patient    Methods  Verbal cues;Explanation    Comprehension  Verbalized understanding;Verbal cues required       PT Short Term Goals - 03/11/17 1226      PT SHORT TERM GOAL #1   Title  pt to be I with inital HEp     Time  3    Period  Weeks    Status  New    Target Date  04/01/17       PT SHORT TERM GOAL #2   Title  pt to demo/ verbalize proper posture during sitting/ standing and lifting and utliizes breaks appropriately to prevent/ reduce low back pain/ tightness    Time  3    Period  Weeks    Status  New    Target Date  04/01/17        PT Long Term Goals - 03/11/17 1227      PT LONG TERM GOAL #1   Title  improve bil hip overall strength to >/= 4/5 to promote hip stability with walking/ standing activities and promte safety    Time  6    Period  Weeks    Status  New    Target Date  04/22/17      PT LONG TERM GOAL #2   Title  pt to be able to stand and walk >/= 1 hour with no report of pain or feeling unsteady for function endurance required for ADLs     Time  6    Period  Weeks    Status  New    Target Date  04/22/17      PT LONG TERM GOAL #3   Title  pt to increase FOTO score to </= 41% limited to demo improvement in fucntion    Time  6    Period  Weeks    Status  New    Target Date  04/22/17      PT LONG TERM GOAL #4   Title  pt to be able to return to gym exercise with </= 1/10 pain to promote pt personal    Time  6    Period  Weeks    Status  New    Target Date  04/22/17      PT LONG TERM GOAL #5   Title  pt to be I with all HEp given as of last visit     Time  6    Period  Weeks    Status  New    Target Date  04/22/17             Plan - 03/11/17 1154    Clinical Impression Statement  pt presents to OPPT with CC of chronic intermittent low back pain starting over 20 years ago. fuctnional ROM in the trunk noted with no pain. she demonstreasts weakness in bil hips. TTP at the R PSIS and along the glute medius with stiffness with SLR due to signifcant hamstring stightness. following stretching of th ehamstring and SLR activties she reproted improvement of pain and not feeling like her leg was going to buckle when she first stood up. she would benefit from physical therapy to decrease low back pain, improve trunk mobility and reduce  apprehension of falling or instability by addressing the  deficits listed.     Clinical Presentation  Stable    Clinical Decision Making  Low    Rehab Potential  Good    PT Frequency  2x / week    PT Duration  6 weeks    PT Treatment/Interventions  ADLs/Self Care Home Management;Electrical Stimulation;Iontophoresis 4mg /ml Dexamethasone;Moist Heat;Ultrasound;Taping;Manual techniques;Therapeutic exercise;Therapeutic activities;Neuromuscular re-education;Patient/family education;Dry needling    PT Next Visit Plan  review and update HEP, possible post innominate rotation onthe R, hip strengthening, posture/ core strengthening.    PT Home Exercise Plan  hamstring stretching, SLR, pelvic tilt (in supine), LTR    Consulted and Agree with Plan of Care  Patient       Patient will benefit from skilled therapeutic intervention in order to improve the following deficits and impairments:  Pain, Obesity, Decreased strength, Decreased activity tolerance, Decreased balance, Improper body mechanics, Postural dysfunction, Increased fascial restricitons  Visit Diagnosis: Chronic bilateral low back pain, with sciatica presence unspecified - Plan: PT plan of care cert/re-cert  Muscle weakness (generalized) - Plan: PT plan of care cert/re-cert  Cramp and spasm - Plan: PT plan of care cert/re-cert  Unsteadiness - Plan: PT plan of care cert/re-cert  G-Codes - 03/11/17 1232    Functional Assessment Tool Used (Outpatient Only)  MMT, FOTO/ clinical judgement    Functional Limitation  Mobility: Walking and moving around    Mobility: Walking and Moving Around Current Status ([id number])  At least 40 percent but less than 60 percent impaired, limited or restricted    Mobility: Walking and Moving Around Goal Status [phone]([id number])  At least 20 percent but less than 40 percent impaired, limited or restricted        Problem List Patient Active Problem List   Diagnosis Date Noted  . Sleep talking 06/04/2015  . SOB (shortness  of breath)   . Morbid obesity (HCC)   . Fibromyalgia   . Hyperlipidemia   . Hypothyroidism   . Paroxysmal atrial flutter (HCC)   . ANXIETY DEPRESSION 04/15/2007  . REM SLEEP BEHAVIOR DISORDER 04/15/2007  . UNSPECIFIED HYPOTHYROIDISM 04/14/2007  . EXOGENOUS OBESITY 02/10/2007  . OBSTRUCTIVE SLEEP APNEA 02/10/2007  . PERIODIC LIMB MOVEMENT DISORDER 02/10/2007  . RHINITIS 02/10/2007   Lulu RidingKristoffer Laurrie Toppin PT, DPT, LAT, ATC  03/11/17  12:40 PM      [facility] [address] [place], [place], [address] Phone: [phone]   Fax:  [phone]  Name: [name] [name] MRN: [id number] Date of Birth: 05/24/1950

## 2017-03-18 ENCOUNTER — Encounter: Payer: Self-pay | Admitting: Physical Therapy

## 2017-03-18 ENCOUNTER — Ambulatory Visit: Payer: PPO | Admitting: Physical Therapy

## 2017-03-18 DIAGNOSIS — M545 Low back pain: Secondary | ICD-10-CM

## 2017-03-18 DIAGNOSIS — M6281 Muscle weakness (generalized): Secondary | ICD-10-CM

## 2017-03-18 DIAGNOSIS — R252 Cramp and spasm: Secondary | ICD-10-CM

## 2017-03-18 DIAGNOSIS — G8929 Other chronic pain: Secondary | ICD-10-CM

## 2017-03-18 DIAGNOSIS — R2681 Unsteadiness on feet: Secondary | ICD-10-CM

## 2017-03-18 NOTE — Therapy (Addendum)
[facility] [address] [place], [place], [address] Phone: [phone]   Fax:  [phone]  Physical Therapy Treatment  Patient Details  Name: [name] [name] MRN: [id number] Date of Birth: 09/16/1950 Referring Provider: Geoffry ParadiseAronson, Richard, MD   Encounter Date: 03/18/2017  PT End of Session - 03/18/17 1009    Visit Number  2    Number of Visits  13    Date for PT Re-Evaluation  04/22/17    Authorization Type  MCR: Kx mod by 15th visit, Progress note by 10th visit.     PT Start Time  1015    PT Stop Time  1105    PT Time Calculation (min)  50 min    Activity Tolerance  Patient tolerated treatment well    Behavior During Therapy  WFL for tasks assessed/performed       Past Medical History:  Diagnosis Date  . Anxiety and depression   . Exogenous obesity   . Fibromyalgia   . Hyperlipidemia   . Hypothyroidism   . Morbid obesity (HCC)   . OSA (obstructive sleep apnea)    uses CPAP  . Paroxysmal atrial flutter (HCC)   . Periodic limb movement disorder   . REM sleep behavior disorder   . Rhinitis   . SOB (shortness of breath)   . Spinal stenosis   . Unspecified hypothyroidism     Past Surgical History:  Procedure Laterality Date  . CARDIOVERSION N/A 04/29/2015   Performed by Yates DecampGanji, Jay, MD [facility]  . CARPAL TUNNEL RELEASE     Bilateral  . DILATION AND CURETTAGE OF UTERUS    . left rotator cuff    . resection uterine polyp  2011    There were no vitals filed for this visit.  Subjective Assessment - 03/18/17 1009    Subjective  "I am feeling more sore with every step I take"     Currently in Pain?  Yes    Pain Score  5     Pain Location  Back    Pain Onset  More than a month ago    Pain Frequency  Intermittent    Aggravating Factors   unsure, everyday activities    Pain Relieving Factors  heating pad, sitting pad                       [facility] Adult PT Treatment/Exercise - 03/18/17 1035       Lumbar Exercises: Stretches   Active Hamstring Stretch  3 reps;30 seconds contract/ relax with 10 sec hold      Knee/Hip Exercises: Supine   Straight Leg Raises  2 sets;10 reps      Modalities   Modalities  Moist Heat      Moist Heat Therapy   Number Minutes Moist Heat  10 Minutes    Moist Heat Location  Lumbar Spine in prone      Manual Therapy   Manual Therapy  Joint mobilization;Soft tissue mobilization;Myofascial release    Joint Mobilization  L1-L5 PA grade 3    Soft tissue mobilization  IASTM over bil lumbar paraspinals    Myofascial Release  fascial stretching/ rolling    Muscle Energy Technique  resisted R hip flexion and L hip extension 5 x 10 sec       Trigger Point Dry Needling - 03/18/17 1043    Consent Given?  Yes    Education Handout Provided  Yes  Muscles Treated Upper Body  Longissimus    Longissimus Response  Twitch response elicited;Palpable increased muscle length L4-L5 bil           PT Education - 03/18/17 1047    Education provided  Yes    Education Details  muscle anatomy and referral patterns. benefits of TPDN, what to expect and after care. Update HEP.     Person(s) Educated  Patient    Methods  Explanation;Verbal cues    Comprehension  Verbalized understanding;Verbal cues required       PT Short Term Goals - 03/11/17 1226      PT SHORT TERM GOAL #1   Title  pt to be I with inital HEp     Time  3    Period  Weeks    Status  New    Target Date  04/01/17      PT SHORT TERM GOAL #2   Title  pt to demo/ verbalize proper posture during sitting/ standing and lifting and utliizes breaks appropriately to prevent/ reduce low back pain/ tightness    Time  3    Period  Weeks    Status  New    Target Date  04/01/17        PT Long Term Goals - 03/11/17 1227      PT LONG TERM GOAL #1   Title  improve bil hip overall strength to >/= 4/5 to promote hip stability with walking/ standing activities and promte safety    Time  6    Period   Weeks    Status  New    Target Date  04/22/17      PT LONG TERM GOAL #2   Title  pt to be able to stand and walk >/= 1 hour with no report of pain or feeling unsteady for function endurance required for ADLs     Time  6    Period  Weeks    Status  New    Target Date  04/22/17      PT LONG TERM GOAL #3   Title  pt to increase FOTO score to </= 41% limited to demo improvement in fucntion    Time  6    Period  Weeks    Status  New    Target Date  04/22/17      PT LONG TERM GOAL #4   Title  pt to be able to return to gym exercise with </= 1/10 pain to promote pt personal    Time  6    Period  Weeks    Status  New    Target Date  04/22/17      PT LONG TERM GOAL #5   Title  pt to be I with all HEp given as of last visit     Time  6    Period  Weeks    Status  New    Target Date  04/22/17         Plan:  Pt reported continued pain that worsens with gait. Educated and performed TPDN for the lumbar multifidus followed with manual techniques. Reviewed previously provided HEP which she required minimal cuing. Post session utilized MHP to calm down muscle tightness/ soreness.   Next visit: assess response to DN, manual PRN, trial manual traction, core strengthening/ hip strengthening. Assess possible innominate rotation.      Patient will benefit from skilled therapeutic intervention in order to improve the following deficits and impairments:  Pain; Obesity; Decreased strength; Decreased activity tolerance; Decreased balance; Improper body mechanics; Postural dysfunction; Increased fascial restricitons  Visit Diagnosis: Muscle weakness (generalized)  Cramp and spasm  Unsteadiness  Chronic bilateral low back pain, with sciatica presence unspecified     Problem List Patient Active Problem List   Diagnosis Date Noted  . Sleep talking 06/04/2015  . SOB (shortness of breath)   . Morbid obesity (HCC)   . Fibromyalgia   . Hyperlipidemia   . Hypothyroidism   .  Paroxysmal atrial flutter (HCC)   . ANXIETY DEPRESSION 04/15/2007  . REM SLEEP BEHAVIOR DISORDER 04/15/2007  . UNSPECIFIED HYPOTHYROIDISM 04/14/2007  . EXOGENOUS OBESITY 02/10/2007  . OBSTRUCTIVE SLEEP APNEA 02/10/2007  . PERIODIC LIMB MOVEMENT DISORDER 02/10/2007  . RHINITIS 02/10/2007   Lulu RidingKristoffer Hence Derrick PT, DPT, LAT, ATC  03/18/17  11:26 AM      [facility] [address] [place], [place], [address] Phone: [phone]   Fax:  [phone]  Name: [name] [name] MRN: [id number] Date of Birth: 04/28/1951

## 2017-03-28 ENCOUNTER — Encounter: Payer: Self-pay | Admitting: Physical Therapy

## 2017-03-28 ENCOUNTER — Ambulatory Visit: Payer: PPO | Admitting: Physical Therapy

## 2017-03-28 DIAGNOSIS — R252 Cramp and spasm: Secondary | ICD-10-CM

## 2017-03-28 DIAGNOSIS — M545 Low back pain: Principal | ICD-10-CM

## 2017-03-28 DIAGNOSIS — M6281 Muscle weakness (generalized): Secondary | ICD-10-CM

## 2017-03-28 DIAGNOSIS — G8929 Other chronic pain: Secondary | ICD-10-CM

## 2017-03-28 NOTE — Therapy (Signed)
[facility] Outpatient Rehabilitation [facility] [address] [place], [place], [address] Phone: [phone]   Fax:  [phone]  Physical Therapy Treatment  Patient Details  Name: [name] [name] MRN: [id number] Date of Birth: 1951-01-28 Referring Provider: Geoffry Paradise, MD   Encounter Date: 03/28/2017  PT End of Session - 03/28/17 1335    Visit Number  3    Number of Visits  13    Date for PT Re-Evaluation  04/22/17    Authorization Type  MCR: Kx mod by 15th visit, Progress note by 10th visit.     PT Start Time  1150    PT Stop Time  1238    PT Time Calculation (min)  48 min    Activity Tolerance  Patient tolerated treatment well    Behavior During Therapy  WFL for tasks assessed/performed       Past Medical History:  Diagnosis Date  . Anxiety and depression   . Exogenous obesity   . Fibromyalgia   . Hyperlipidemia   . Hypothyroidism   . Morbid obesity (HCC)   . OSA (obstructive sleep apnea)    uses CPAP  . Paroxysmal atrial flutter (HCC)   . Periodic limb movement disorder   . REM sleep behavior disorder   . Rhinitis   . SOB (shortness of breath)   . Spinal stenosis   . Unspecified hypothyroidism     Past Surgical History:  Procedure Laterality Date  . CARDIOVERSION N/A 04/29/2015   Procedure: CARDIOVERSION;  Surgeon: Yates Decamp, MD;  Location: [facility] ENDOSCOPY;  Service: Cardiovascular;  Laterality: N/A;  . CARPAL TUNNEL RELEASE     Bilateral  . DILATION AND CURETTAGE OF UTERUS    . left rotator cuff    . resection uterine polyp  2011    There were no vitals filed for this visit.  Subjective Assessment - 03/28/17 1157    Subjective  Pt. currently reports 4/10 pain in low back that is feeling better today vs. yesterday. She states with onset of pain "it feels like my whole body is sore."     Currently in Pain?  Yes    Pain Score  4     Pain Location  Back    Pain Orientation  Right    Pain Onset  More than a month ago       [facility] PT  Assessment - 03/28/17 1328      Special Tests    Special Tests  Leg LengthTest    Leg length test   True;Apparent      True   Length  measured from ASIS to bottom of medial malleolus    Right  84.2 in. measured in cm    Left   84 in. measured in cm    Comments  measrured in cm not inches      Apparent   Length  measured from umbilicus to bottom of medial malleolus    Right  98.4 in. measured in cm    Left  99.5 in. measured in cm    Comments  measured in cm not inches        [facility] Adult PT Treatment/Exercise - 03/28/17 1157      Self-Care   Self-Care  Other Self-Care Comments    Other Self-Care Comments   pt. fitted with heel lift in R shoe to address true and apparent leg length discrepency; pt. noted relief of pain with standing and walking      Lumbar Exercises:  Stretches   Active Hamstring Stretch  3 reps;30 seconds contract/ relax with 10 sec hold, Right leg    Active Hamstring Stretch Limitations  performed 3 reps at the beginning of session; another 3 reps after performing long axis distraction      Knee/Hip Exercises: Stretches   Active Hamstring Stretch  Right      Knee/Hip Exercises: Aerobic   Nustep  L5 x 5 minutes       Manual Therapy   Manual Therapy  Joint mobilization;Muscle Energy Technique    Joint Mobilization  long axis distraction; grade 3-4 progressing into grade 5; right; 5 bouts     Muscle Energy Technique  Resisted R hip flexion L hip extension in supine, pt. compenstated by pushing through heel; resisted R hip flexion; 3 reps        PT Education - 03/28/17 1339    Education provided  Yes    Education Details  pt. education on innominate rotation and hamstring anatomy    Person(s) Educated  Patient    Methods  Explanation    Comprehension  Verbalized understanding       PT Short Term Goals - 03/11/17 1226      PT SHORT TERM GOAL #1   Title  pt to be I with inital HEp     Time  3    Period  Weeks    Status  New    Target Date  04/01/17       PT SHORT TERM GOAL #2   Title  pt to demo/ verbalize proper posture during sitting/ standing and lifting and utliizes breaks appropriately to prevent/ reduce low back pain/ tightness    Time  3    Period  Weeks    Status  New    Target Date  04/01/17       PT Long Term Goals - 03/11/17 1227      PT LONG TERM GOAL #1   Title  improve bil hip overall strength to >/= 4/5 to promote hip stability with walking/ standing activities and promte safety    Time  6    Period  Weeks    Status  New    Target Date  04/22/17      PT LONG TERM GOAL #2   Title  pt to be able to stand and walk >/= 1 hour with no report of pain or feeling unsteady for function endurance required for ADLs     Time  6    Period  Weeks    Status  New    Target Date  04/22/17      PT LONG TERM GOAL #3   Title  pt to increase FOTO score to </= 41% limited to demo improvement in fucntion    Time  6    Period  Weeks    Status  New    Target Date  04/22/17      PT LONG TERM GOAL #4   Title  pt to be able to return to gym exercise with </= 1/10 pain to promote pt personal    Time  6    Period  Weeks    Status  New    Target Date  04/22/17      PT LONG TERM GOAL #5   Title  pt to be I with all HEp given as of last visit     Time  6    Period  Weeks    Status  New    Target Date  04/22/17       Plan - 03/28/17 1343    Clinical Impression Statement  During today's session pt. was assessed for leg length discrepancy and possible innominate rotation. Pt. received multiple repetitions of hamstring stretching to attempt to address possible rotation. Pt. was fitted with heel lift and noted decreased pain with standing and ambulation.     PT Treatment/Interventions  ADLs/Self Care Home Management;Electrical Stimulation;Iontophoresis 4mg /ml Dexamethasone;Moist Heat;Ultrasound;Taping;Manual techniques;Therapeutic exercise;Therapeutic activities;Neuromuscular re-education;Patient/family education;Dry needling    PT  Next Visit Plan  assess response to DN, manual PRN, trial manual traction, core strengthening/ hip strengthening. Assess possible innominate rotation.    PT Home Exercise Plan  hamstring stretching, SLR, pelvic tilt (in supine), LTR    Consulted and Agree with Plan of Care  Patient       Patient will benefit from skilled therapeutic intervention in order to improve the following deficits and impairments:  Pain, Obesity, Decreased strength, Decreased activity tolerance, Decreased balance, Improper body mechanics, Postural dysfunction, Increased fascial restricitons  Visit Diagnosis: Chronic bilateral low back pain, with sciatica presence unspecified  Cramp and spasm  Muscle weakness (generalized)     Problem List Patient Active Problem List   Diagnosis Date Noted  . Sleep talking 06/04/2015  . SOB (shortness of breath)   . Morbid obesity (HCC)   . Fibromyalgia   . Hyperlipidemia   . Hypothyroidism   . Paroxysmal atrial flutter (HCC)   . ANXIETY DEPRESSION 04/15/2007  . REM SLEEP BEHAVIOR DISORDER 04/15/2007  . UNSPECIFIED HYPOTHYROIDISM 04/14/2007  . EXOGENOUS OBESITY 02/10/2007  . OBSTRUCTIVE SLEEP APNEA 02/10/2007  . PERIODIC LIMB MOVEMENT DISORDER 02/10/2007  . RHINITIS 02/10/2007    Maryruth HancockOladimeji Corion Sherrod, SPT 03/28/17 1:56 PM    [facility] [address] [place], [place], [address] Phone: [phone]   Fax:  [phone]  Name: [name] [name] MRN: [id number] Date of Birth: 02/15/1951

## 2017-03-30 ENCOUNTER — Ambulatory Visit: Payer: PPO | Admitting: Physical Therapy

## 2017-03-30 ENCOUNTER — Encounter: Payer: Self-pay | Admitting: Physical Therapy

## 2017-03-30 DIAGNOSIS — M545 Low back pain: Principal | ICD-10-CM

## 2017-03-30 DIAGNOSIS — R2681 Unsteadiness on feet: Secondary | ICD-10-CM

## 2017-03-30 DIAGNOSIS — M6281 Muscle weakness (generalized): Secondary | ICD-10-CM

## 2017-03-30 DIAGNOSIS — R252 Cramp and spasm: Secondary | ICD-10-CM

## 2017-03-30 DIAGNOSIS — G8929 Other chronic pain: Secondary | ICD-10-CM

## 2017-03-30 NOTE — Therapy (Signed)
[facility] Outpatient Rehabilitation [facility] [address] [place], [place], [address] Phone: [phone]   Fax:  [phone]  Physical Therapy Treatment  Patient Details  Name: [name] [name] MRN: [id number] Date of Birth: November 16, 1950 Referring Provider: Geoffry Paradise, MD   Encounter Date: 03/30/2017  PT End of Session - 03/30/17 1243    Visit Number  4    Number of Visits  13    Date for PT Re-Evaluation  04/22/17    Authorization Type  MCR: Kx mod by 15th visit, Progress note by 10th visit.     PT Start Time  1146    PT Stop Time  1232    PT Time Calculation (min)  46 min    Activity Tolerance  Patient tolerated treatment well    Behavior During Therapy  WFL for tasks assessed/performed       Past Medical History:  Diagnosis Date  . Anxiety and depression   . Exogenous obesity   . Fibromyalgia   . Hyperlipidemia   . Hypothyroidism   . Morbid obesity (HCC)   . OSA (obstructive sleep apnea)    uses CPAP  . Paroxysmal atrial flutter (HCC)   . Periodic limb movement disorder   . REM sleep behavior disorder   . Rhinitis   . SOB (shortness of breath)   . Spinal stenosis   . Unspecified hypothyroidism     Past Surgical History:  Procedure Laterality Date  . CARDIOVERSION N/A 04/29/2015   Procedure: CARDIOVERSION;  Surgeon: Yates Decamp, MD;  Location: [facility] ENDOSCOPY;  Service: Cardiovascular;  Laterality: N/A;  . CARPAL TUNNEL RELEASE     Bilateral  . DILATION AND CURETTAGE OF UTERUS    . left rotator cuff    . resection uterine polyp  2011    There were no vitals filed for this visit.  Subjective Assessment - 03/30/17 1144    Subjective  "you wouldn't believe that I haven't had pain since the last session. soreness in the front of the hip after driving,     Currently in Pain?  No/denies    Aggravating Factors   sitting/ riding inthe car    Pain Relieving Factors  heating pad, sitting pad                      [facility] Adult PT  Treatment/Exercise - 03/30/17 1157      Self-Care   Other Self-Care Comments   adjusting car seat and recliner at home to avoid having the knee higher than the hip while sitting. Adjusted the car seat to help keeping the knee/ hip in neutral positions.       Lumbar Exercises: Stretches   Active Hamstring Stretch  2 reps;30 seconds    Hip Flexor Stretch  2 reps;30 seconds      Lumbar Exercises: Sidelying   Hip Abduction  10 reps x 2 bil with verbal cues for eccentric control      Knee/Hip Exercises: Aerobic   Nustep  L6 x 5 minutes  LE only inreasing level to promote hip strength      Manual Therapy   Manual therapy comments  MTPR along R glute medius x 4    Soft tissue mobilization  IASTM over R glute medius             PT Education - 03/30/17 1242    Education provided  Yes    Education Details  car seat position to prevent impingement  Person(s) Educated  Patient    Methods  Explanation;Verbal cues;Handout    Comprehension  Verbalized understanding;Verbal cues required       PT Short Term Goals - 03/11/17 1226      PT SHORT TERM GOAL #1   Title  pt to be I with inital HEp     Time  3    Period  Weeks    Status  New    Target Date  04/01/17      PT SHORT TERM GOAL #2   Title  pt to demo/ verbalize proper posture during sitting/ standing and lifting and utliizes breaks appropriately to prevent/ reduce low back pain/ tightness    Time  3    Period  Weeks    Status  New    Target Date  04/01/17        PT Long Term Goals - 03/11/17 1227      PT LONG TERM GOAL #1   Title  improve bil hip overall strength to >/= 4/5 to promote hip stability with walking/ standing activities and promte safety    Time  6    Period  Weeks    Status  New    Target Date  04/22/17      PT LONG TERM GOAL #2   Title  pt to be able to stand and walk >/= 1 hour with no report of pain or feeling unsteady for function endurance required for ADLs     Time  6    Period  Weeks     Status  New    Target Date  04/22/17      PT LONG TERM GOAL #3   Title  pt to increase FOTO score to </= 41% limited to demo improvement in fucntion    Time  6    Period  Weeks    Status  New    Target Date  04/22/17      PT LONG TERM GOAL #4   Title  pt to be able to return to gym exercise with </= 1/10 pain to promote pt personal    Time  6    Period  Weeks    Status  New    Target Date  04/22/17      PT LONG TERM GOAL #5   Title  pt to be I with all HEp given as of last visit     Time  6    Period  Weeks    Status  New    Target Date  04/22/17            Plan - 03/30/17 1243    Clinical Impression Statement  mrs. [name] reports continued relief of pain with the heel lift in the R shoe. hip stretching and strengthening performed today which she fatigued quickly with sidelying hip abduction. Adjusted pt's car seat position to assist with relief of possible impingment in the hip. post session she reported no pain and declined modalities.     Rehab Potential  Good    PT Treatment/Interventions  ADLs/Self Care Home Management;Electrical Stimulation;Iontophoresis 4mg /ml Dexamethasone;Moist Heat;Ultrasound;Taping;Manual techniques;Therapeutic exercise;Therapeutic activities;Neuromuscular re-education;Patient/family education;Dry needling    PT Next Visit Plan  DN PRN, manual PRN, trial manual traction, core strengthening/ hip strengthening. Assess possible innominate rotation.    PT Home Exercise Plan  hamstring stretching, SLR, pelvic tilt (in supine), LTR, sidelying hip abduction    Consulted and Agree with Plan of Care  Patient  Patient will benefit from skilled therapeutic intervention in order to improve the following deficits and impairments:  Pain, Obesity, Decreased strength, Decreased activity tolerance, Decreased balance, Improper body mechanics, Postural dysfunction, Increased fascial restricitons  Visit Diagnosis: Chronic bilateral low back pain, with sciatica  presence unspecified  Cramp and spasm  Muscle weakness (generalized)  Unsteadiness     Problem List Patient Active Problem List   Diagnosis Date Noted  . Sleep talking 06/04/2015  . SOB (shortness of breath)   . Morbid obesity (HCC)   . Fibromyalgia   . Hyperlipidemia   . Hypothyroidism   . Paroxysmal atrial flutter (HCC)   . ANXIETY DEPRESSION 04/15/2007  . REM SLEEP BEHAVIOR DISORDER 04/15/2007  . UNSPECIFIED HYPOTHYROIDISM 04/14/2007  . EXOGENOUS OBESITY 02/10/2007  . OBSTRUCTIVE SLEEP APNEA 02/10/2007  . PERIODIC LIMB MOVEMENT DISORDER 02/10/2007  . RHINITIS 02/10/2007   Lulu RidingKristoffer Nkenge Sonntag PT, DPT, LAT, ATC  03/30/17  12:46 PM      [facility] [address] [place], [place], [address] Phone: [phone]   Fax:  [phone]  Name: [name] [name] MRN: [id number] Date of Birth: 10/22/1950

## 2017-04-06 ENCOUNTER — Ambulatory Visit: Payer: PPO | Attending: Internal Medicine | Admitting: Physical Therapy

## 2017-04-06 ENCOUNTER — Encounter: Payer: Self-pay | Admitting: Physical Therapy

## 2017-04-06 DIAGNOSIS — M545 Low back pain: Secondary | ICD-10-CM | POA: Diagnosis not present

## 2017-04-06 DIAGNOSIS — R2681 Unsteadiness on feet: Secondary | ICD-10-CM | POA: Diagnosis not present

## 2017-04-06 DIAGNOSIS — M6281 Muscle weakness (generalized): Secondary | ICD-10-CM | POA: Diagnosis not present

## 2017-04-06 DIAGNOSIS — R252 Cramp and spasm: Secondary | ICD-10-CM

## 2017-04-06 DIAGNOSIS — G8929 Other chronic pain: Secondary | ICD-10-CM | POA: Insufficient documentation

## 2017-04-06 NOTE — Therapy (Signed)
[facility] [address] [place], [place], [address] Phone: [phone]   Fax:  [phone]  Physical Therapy Treatment  Patient Details  Name: Gillermina HuDeborah G Streety MRN: [id number] Date of Birth: 01/14/1951 Referring Provider: Geoffry ParadiseAronson, Richard, MD   Encounter Date: 04/06/2017  PT End of Session - 04/06/17 1301    Visit Number  5    Number of Visits  13    Date for PT Re-Evaluation  04/22/17    Authorization Type  MCR: Kx mod by 15th visit, Progress note by 10th visit.     PT Start Time  1152    PT Stop Time  1235    PT Time Calculation (min)  43 min    Activity Tolerance  Patient tolerated treatment well    Behavior During Therapy  WFL for tasks assessed/performed       Past Medical History:  Diagnosis Date  . Anxiety and depression   . Exogenous obesity   . Fibromyalgia   . Hyperlipidemia   . Hypothyroidism   . Morbid obesity (HCC)   . OSA (obstructive sleep apnea)    uses CPAP  . Paroxysmal atrial flutter (HCC)   . Periodic limb movement disorder   . REM sleep behavior disorder   . Rhinitis   . SOB (shortness of breath)   . Spinal stenosis   . Unspecified hypothyroidism     Past Surgical History:  Procedure Laterality Date  . CARDIOVERSION N/A 04/29/2015   Procedure: CARDIOVERSION;  Surgeon: Yates DecampJay Ganji, MD;  [facility];  Service: Cardiovascular;  Laterality: N/A;  . CARPAL TUNNEL RELEASE     Bilateral  . DILATION AND CURETTAGE OF UTERUS    . left rotator cuff    . resection uterine polyp  2011    There were no vitals filed for this visit.  Subjective Assessment - 04/06/17 1156    Subjective  "I haven't had any pain I am very happy with how everything is going. I was able to do alot of walking with no issues inthe back. Adjusting the seat in my car really helped"    Currently in Pain?  No/denies    Aggravating Factors   seated                      [facility] Adult PT Treatment/Exercise -  04/06/17 1256      Knee/Hip Exercises: Stretches   Piriformis Stretch  2 reps;30 seconds      Knee/Hip Exercises: Aerobic   Nustep  L7 x 8 minutes  LE only to increase endurance      Knee/Hip Exercises: Standing   Hip ADduction  -- with green theraband    Hip Abduction  2 sets;10 reps;Knee straight    Hip Extension  2 sets;10 reps;Knee straight with green theraband      Knee/Hip Exercises: Seated   Other Seated Knee/Hip Exercises  ER of the R hip 2 x 12 with green theraband    Sit to Sand  2 sets;10 reps;without UE support with green theraband around knees             PT Education - 04/06/17 1304    Education provided  Yes    Education Details  pt progress, updated HEP. reviewed anatomy of the piriformis and potential effect of N/T in the foot.    Person(s) Educated  Patient    Methods  Explanation;Verbal cues    Comprehension  Verbalized understanding;Verbal cues required  PT Short Term Goals - 03/11/17 1226      PT SHORT TERM GOAL #1   Title  pt to be I with inital HEp     Time  3    Period  Weeks    Status  New    Target Date  04/01/17      PT SHORT TERM GOAL #2   Title  pt to demo/ verbalize proper posture during sitting/ standing and lifting and utliizes breaks appropriately to prevent/ reduce low back pain/ tightness    Time  3    Period  Weeks    Status  New    Target Date  04/01/17        PT Long Term Goals - 03/11/17 1227      PT LONG TERM GOAL #1   Title  improve bil hip overall strength to >/= 4/5 to promote hip stability with walking/ standing activities and promte safety    Time  6    Period  Weeks    Status  New    Target Date  04/22/17      PT LONG TERM GOAL #2   Title  pt to be able to stand and walk >/= 1 hour with no report of pain or feeling unsteady for function endurance required for ADLs     Time  6    Period  Weeks    Status  New    Target Date  04/22/17      PT LONG TERM GOAL #3   Title  pt to increase FOTO score to  </= 41% limited to demo improvement in fucntion    Time  6    Period  Weeks    Status  New    Target Date  04/22/17      PT LONG TERM GOAL #4   Title  pt to be able to return to gym exercise with </= 1/10 pain to promote pt personal    Time  6    Period  Weeks    Status  New    Target Date  04/22/17      PT LONG TERM GOAL #5   Title  pt to be I with all HEp given as of last visit     Time  6    Period  Weeks    Status  New    Target Date  04/22/17            Plan - 04/06/17 1301    Clinical Impression Statement  pt continues to make great progress with PT reporting no pain with walking/ standing activities, she states she has minimal N/T in the R foot but states it only stays inthe toes. continued working on hip stability/ strengthening with emphasis for endurance. If pt continues to progress well then plan to D/C in the next few visits.     PT Treatment/Interventions  ADLs/Self Care Home Management;Electrical Stimulation;Iontophoresis 4mg /ml Dexamethasone;Moist Heat;Ultrasound;Taping;Manual techniques;Therapeutic exercise;Therapeutic activities;Neuromuscular re-education;Patient/family education;Dry needling    PT Next Visit Plan  DN PRN, manual PRN, trial manual traction, core strengthening/ hip strengthening. Assess possible innominate rotation.     PT Home Exercise Plan  hamstring stretching, SLR, pelvic tilt (in supine), LTR, sidelying hip abduction    Consulted and Agree with Plan of Care  Patient       Patient will benefit from skilled therapeutic intervention in order to improve the following deficits and impairments:  Pain, Obesity, Decreased strength, Decreased activity tolerance, Decreased  balance, Improper body mechanics, Postural dysfunction, Increased fascial restricitons  Visit Diagnosis: Chronic bilateral low back pain, with sciatica presence unspecified  Cramp and spasm  Muscle weakness (generalized)  Unsteadiness     Problem List Patient Active  Problem List   Diagnosis Date Noted  . Sleep talking 06/04/2015  . SOB (shortness of breath)   . Morbid obesity (HCC)   . Fibromyalgia   . Hyperlipidemia   . Hypothyroidism   . Paroxysmal atrial flutter (HCC)   . ANXIETY DEPRESSION 04/15/2007  . REM SLEEP BEHAVIOR DISORDER 04/15/2007  . UNSPECIFIED HYPOTHYROIDISM 04/14/2007  . EXOGENOUS OBESITY 02/10/2007  . OBSTRUCTIVE SLEEP APNEA 02/10/2007  . PERIODIC LIMB MOVEMENT DISORDER 02/10/2007  . RHINITIS 02/10/2007   Lulu Riding PT, DPT, LAT, ATC  04/06/17  1:07 PM      [facility] [address] [place], [place], [address] Phone: [phone]   Fax:  [phone]  Name: JOURI THREAT MRN: [id number] Date of Birth: 1950-05-19

## 2017-04-08 ENCOUNTER — Ambulatory Visit: Payer: PPO | Admitting: Physical Therapy

## 2017-04-08 ENCOUNTER — Encounter: Payer: Self-pay | Admitting: Physical Therapy

## 2017-04-08 DIAGNOSIS — M545 Low back pain: Secondary | ICD-10-CM | POA: Diagnosis not present

## 2017-04-08 DIAGNOSIS — G8929 Other chronic pain: Secondary | ICD-10-CM

## 2017-04-08 DIAGNOSIS — R2681 Unsteadiness on feet: Secondary | ICD-10-CM

## 2017-04-08 DIAGNOSIS — M6281 Muscle weakness (generalized): Secondary | ICD-10-CM

## 2017-04-08 DIAGNOSIS — R252 Cramp and spasm: Secondary | ICD-10-CM

## 2017-04-08 NOTE — Therapy (Signed)
[facility] [address] [place], [place], [address] Phone: [phone]   Fax:  [phone]  Physical Therapy Treatment  Patient Details  Name: [name] [name] MRN: [id number] Date of Birth: 09/30/1950 Referring Provider: Geoffry ParadiseAronson, Richard, MD   Encounter Date: 04/08/2017  PT End of Session - 04/08/17 1212    Visit Number  6    Number of Visits  13    Date for PT Re-Evaluation  04/22/17    Authorization Type  MCR: Kx mod by 15th visit, Progress note by 10th visit.     PT Start Time  0930    PT Stop Time  1024    PT Time Calculation (min)  54 min    Activity Tolerance  Patient tolerated treatment well    Behavior During Therapy  WFL for tasks assessed/performed       Past Medical History:  Diagnosis Date  . Anxiety and depression   . Exogenous obesity   . Fibromyalgia   . Hyperlipidemia   . Hypothyroidism   . Morbid obesity (HCC)   . OSA (obstructive sleep apnea)    uses CPAP  . Paroxysmal atrial flutter (HCC)   . Periodic limb movement disorder   . REM sleep behavior disorder   . Rhinitis   . SOB (shortness of breath)   . Spinal stenosis   . Unspecified hypothyroidism     Past Surgical History:  Procedure Laterality Date  . CARDIOVERSION N/A 04/29/2015   Procedure: CARDIOVERSION;  Surgeon: Yates DecampJay Ganji, MD;  [facility];  Service: Cardiovascular;  Laterality: N/A;  . CARPAL TUNNEL RELEASE     Bilateral  . DILATION AND CURETTAGE OF UTERUS    . left rotator cuff    . resection uterine polyp  2011    There were no vitals filed for this visit.  Subjective Assessment - 04/08/17 0925    Subjective  verall pt. notes she has been doing well but notes "I'm a little sore today, I've been sitting a lot".     Currently in Pain?  No/denies    Pain Score  0-No pain    Pain Location  Back    Pain Orientation  Right       [facility] Adult PT Treatment/Exercise - 04/08/17 0927      Lumbar Exercises: Supine   Clam   15 reps green theraband    Straight Leg Raise  15 reps done bilaterally    Other Supine Lumbar Exercises  pt. in supine, hips-kness 90-90, heels on physioball, cues to use drawing in manuever and roll ball back towards stomach x 20 reps, same position as described pt. used UE to try and touch knees x20 reps; pt. in supine knees bent using hands to try and touch heels x 20 (10 reps, bilaterally) reps;    Other Supine Lumbar Exercises  resisted lower trunk rotations, knees bent, green theraband, 15 reps, bilaterally       Knee/Hip Exercises: Aerobic   Nustep  L7 x 8 minutes, LE only       Modalities   Modalities  Moist Heat      Moist Heat Therapy   Number Minutes Moist Heat  10 Minutes    Moist Heat Location  Lumbar Spine pt. in lt. sidelying (rt. hip facing up)      Manual Therapy   Manual Therapy  Soft tissue mobilization    Manual therapy comments  MTPR along R glute medius and piriformis, x 4  Soft tissue mobilization  Rolling tool over rt. glute medius and piriformis to decrease muscle soreness       PT Short Term Goals - 03/11/17 1226      PT SHORT TERM GOAL #1   Title  pt to be I with inital HEp     Time  3    Period  Weeks    Status  New    Target Date  04/01/17      PT SHORT TERM GOAL #2   Title  pt to demo/ verbalize proper posture during sitting/ standing and lifting and utliizes breaks appropriately to prevent/ reduce low back pain/ tightness    Time  3    Period  Weeks    Status  New    Target Date  04/01/17       PT Long Term Goals - 03/11/17 1227      PT LONG TERM GOAL #1   Title  improve bil hip overall strength to >/= 4/5 to promote hip stability with walking/ standing activities and promte safety    Time  6    Period  Weeks    Status  New    Target Date  04/22/17      PT LONG TERM GOAL #2   Title  pt to be able to stand and walk >/= 1 hour with no report of pain or feeling unsteady for function endurance required for ADLs     Time  6    Period   Weeks    Status  New    Target Date  04/22/17      PT LONG TERM GOAL #3   Title  pt to increase FOTO score to </= 41% limited to demo improvement in fucntion    Time  6    Period  Weeks    Status  New    Target Date  04/22/17      PT LONG TERM GOAL #4   Title  pt to be able to return to gym exercise with </= 1/10 pain to promote pt personal    Time  6    Period  Weeks    Status  New    Target Date  04/22/17      PT LONG TERM GOAL #5   Title  pt to be I with all HEp given as of last visit     Time  6    Period  Weeks    Status  New    Target Date  04/22/17       Plan - 04/08/17 1214    Clinical Impression Statement  Pt. reports to session today with increased soreness. Pt. received manual therapy for pain relief before therapeutic exercise. Therapeutic exercises consisted of core stabilization and lumbar exercises. Pt. hip flexibility appears WFL while strength and endurance should continue to be points of emphasis. Pt. tolerated treatment well and received moist heat post-session.    PT Treatment/Interventions  ADLs/Self Care Home Management;Electrical Stimulation;Iontophoresis 4mg /ml Dexamethasone;Moist Heat;Ultrasound;Taping;Manual techniques;Therapeutic exercise;Therapeutic activities;Neuromuscular re-education;Patient/family education;Dry needling    PT Next Visit Plan  DN PRN, manual PRN, trial manual traction, core strengthening/ hip strengthening. Assess possible innominate rotation.     PT Home Exercise Plan  hamstring stretching, SLR, pelvic tilt (in supine), LTR, sidelying hip abduction    Consulted and Agree with Plan of Care  Patient       Patient will benefit from skilled therapeutic intervention in order to improve the following deficits and  impairments:  Pain, Obesity, Decreased strength, Decreased activity tolerance, Decreased balance, Improper body mechanics, Postural dysfunction, Increased fascial restricitons  Visit Diagnosis: Chronic bilateral low back pain,  with sciatica presence unspecified  Cramp and spasm  Muscle weakness (generalized)  Unsteadiness     Problem List Patient Active Problem List   Diagnosis Date Noted  . Sleep talking 06/04/2015  . SOB (shortness of breath)   . Morbid obesity (HCC)   . Fibromyalgia   . Hyperlipidemia   . Hypothyroidism   . Paroxysmal atrial flutter (HCC)   . ANXIETY DEPRESSION 04/15/2007  . REM SLEEP BEHAVIOR DISORDER 04/15/2007  . UNSPECIFIED HYPOTHYROIDISM 04/14/2007  . EXOGENOUS OBESITY 02/10/2007  . OBSTRUCTIVE SLEEP APNEA 02/10/2007  . PERIODIC LIMB MOVEMENT DISORDER 02/10/2007  . RHINITIS 02/10/2007    Maryruth Hancock, SPT 04/08/17 12:20 PM   [facility] [address] [place], [place], [address] Phone: [phone]   Fax:  [phone]  Name: [name] [name] MRN: [id number] Date of Birth: [birth year]

## 2017-04-18 ENCOUNTER — Encounter: Payer: Self-pay | Admitting: Physical Therapy

## 2017-04-18 ENCOUNTER — Ambulatory Visit: Payer: PPO | Admitting: Physical Therapy

## 2017-04-18 DIAGNOSIS — G8929 Other chronic pain: Secondary | ICD-10-CM

## 2017-04-18 DIAGNOSIS — R252 Cramp and spasm: Secondary | ICD-10-CM

## 2017-04-18 DIAGNOSIS — M545 Low back pain: Principal | ICD-10-CM

## 2017-04-18 DIAGNOSIS — R2681 Unsteadiness on feet: Secondary | ICD-10-CM

## 2017-04-18 DIAGNOSIS — M6281 Muscle weakness (generalized): Secondary | ICD-10-CM

## 2017-04-18 NOTE — Patient Instructions (Signed)

## 2017-04-18 NOTE — Therapy (Signed)
[place], [place], [address] Phone: [phone]   Fax:  [phone]  Physical Therapy Treatment  Patient Details  Name: [name] [name] MRN: [id number] Date of Birth: 04-18-1951 Referring Provider: Burnard Bunting, MD   Encounter Date: 04/18/2017  PT End of Session - 04/18/17 1543    Visit Number  7    Number of Visits  13    Date for PT Re-Evaluation  04/22/17    Authorization Type  MCR: Kx mod by 15th visit, Progress note by 10th visit.     PT Start Time  1501    PT Stop Time  1546    PT Time Calculation (min)  45 min    Activity Tolerance  Patient tolerated treatment well    Behavior During Therapy  WFL for tasks assessed/performed       Past Medical History:  Diagnosis Date  . Anxiety and depression   . Exogenous obesity   . Fibromyalgia   . Hyperlipidemia   . Hypothyroidism   . Morbid obesity ([place])   . OSA (obstructive sleep apnea)    uses CPAP  . Paroxysmal atrial flutter ([place])   . Periodic limb movement disorder   . REM sleep behavior disorder   . Rhinitis   . SOB (shortness of breath)   . Spinal stenosis   . Unspecified hypothyroidism     Past Surgical History:  Procedure Laterality Date  . CARDIOVERSION N/A 04/29/2015   Procedure: CARDIOVERSION;  Surgeon: Adrian Prows, MD;  Location: [facility];  Service: Cardiovascular;  Laterality: N/A;  . CARPAL TUNNEL RELEASE     Bilateral  . DILATION AND CURETTAGE OF UTERUS    . left rotator cuff    . resection uterine polyp  2011    There were no vitals filed for this visit.  Subjective Assessment - 04/18/17 1505    Subjective  "I am not sure but I am feeling sore in the one spot in my low back but I have been doing alot of activities, I think it is my sleeping"     Currently in Pain?  No/denies    Pain Score  1     Pain Location  Back    Pain Onset  More than a month ago    Pain Frequency  Intermittent                       [facility] Adult PT Treatment/Exercise - 04/18/17 1507      Self-Care   Self-Care  Posture    Posture  proper posture with sitting/ standing and lifting mechanics as well as ADLs      Lumbar Exercises: Stretches   Active Hamstring Stretch  3 reps;30 seconds contract/ relax with 10 sec hold    Passive Hamstring Stretch  1 rep;30 seconds    Lower Trunk Rotation  -- 2 x 10      Lumbar Exercises: Sidelying   Hip Abduction  15 reps;2 seconds      Knee/Hip Exercises: Aerobic   Nustep  L5 x 10 min LE only constant steady pace      Knee/Hip Exercises: Supine   Straight Leg Raises  2 sets;15 reps      Manual Therapy   Muscle Energy Technique  Resisted R hip flexion L hip extension in supine, pt. compenstated by pushing through heel; resisted R hip flexion; 3 reps self mob resisting R hip flexion 5 x 10 sec  PT Education - 04/18/17 1542    Education provided  Yes    Education Details  proper posture and lifting mechanics.     Person(s) Educated  Patient    Methods  Explanation;Handout;Verbal cues    Comprehension  Verbalized understanding;Verbal cues required       PT Short Term Goals - 03/11/17 1226      PT SHORT TERM GOAL #1   Title  pt to be I with inital HEp     Time  3    Period  Weeks    Status  New    Target Date  04/01/17      PT SHORT TERM GOAL #2   Title  pt to demo/ verbalize proper posture during sitting/ standing and lifting and utliizes breaks appropriately to prevent/ reduce low back pain/ tightness    Time  3    Period  Weeks    Status  New    Target Date  04/01/17        PT Long Term Goals - 03/11/17 1227      PT LONG TERM GOAL #1   Title  improve bil hip overall strength to >/= 4/5 to promote hip stability with walking/ standing activities and promte safety    Time  6    Period  Weeks    Status  New    Target Date  04/22/17      PT LONG TERM GOAL #2   Title  pt to be able to stand and walk >/= 1  hour with no report of pain or feeling unsteady for function endurance required for ADLs     Time  6    Period  Weeks    Status  New    Target Date  04/22/17      PT LONG TERM GOAL #3   Title  pt to increase FOTO score to </= 41% limited to demo improvement in fucntion    Time  6    Period  Weeks    Status  New    Target Date  04/22/17      PT LONG TERM GOAL #4   Title  pt to be able to return to gym exercise with </= 1/10 pain to promote pt personal    Time  6    Period  Weeks    Status  New    Target Date  04/22/17      PT LONG TERM GOAL #5   Title  pt to be I with all HEp given as of last visit     Time  6    Period  Weeks    Status  New    Target Date  04/22/17            Plan - 04/18/17 1546    Clinical Impression Statement  pt reported minimal soreness noted in the R low back and tightness in the low back. She reports she hasn't been as consistent with her HEP and may be contributing to her pain. continued working on MET techniques with Resisted R hip flexion which she reported decreased pain and tightness. reviewed and educated proper posture with lifting/ carrying and ADLS. she declined modalities and reported no pain post session.     PT Treatment/Interventions  ADLs/Self Care Home Management;Electrical Stimulation;Iontophoresis 54m/ml Dexamethasone;Moist Heat;Ultrasound;Taping;Manual techniques;Therapeutic exercise;Therapeutic activities;Neuromuscular re-education;Patient/family education;Dry needling    PT Next Visit Plan  DN PRN, manual PRN, trial manual traction, core strengthening/ hip strengthening. Assess possible  innominate rotation.     PT Home Exercise Plan  hamstring stretching, SLR, pelvic tilt (in supine), LTR, sidelying hip abduction, posture mechahnics    Consulted and Agree with Plan of Care  Patient       Patient will benefit from skilled therapeutic intervention in order to improve the following deficits and impairments:  Pain, Obesity, Decreased  strength, Decreased activity tolerance, Decreased balance, Improper body mechanics, Postural dysfunction, Increased fascial restricitons  Visit Diagnosis: Chronic bilateral low back pain, with sciatica presence unspecified  Cramp and spasm  Muscle weakness (generalized)  Unsteadiness     Problem List Patient Active Problem List   Diagnosis Date Noted  . Sleep talking 06/04/2015  . SOB (shortness of breath)   . Morbid obesity ([place])   . Fibromyalgia   . Hyperlipidemia   . Hypothyroidism   . Paroxysmal atrial flutter ([place])   . ANXIETY DEPRESSION 04/15/2007  . REM SLEEP BEHAVIOR DISORDER 04/15/2007  . UNSPECIFIED HYPOTHYROIDISM 04/14/2007  . EXOGENOUS OBESITY 02/10/2007  . OBSTRUCTIVE SLEEP APNEA 02/10/2007  . PERIODIC LIMB MOVEMENT DISORDER 02/10/2007  . RHINITIS 02/10/2007   Starr Lake PT, DPT, LAT, ATC  04/18/17  3:48 PM      [facility] [address] [place], [place], [address] Phone: [phone]   Fax:  [phone]  Name: [name] [name] MRN: [id number] Date of Birth: 04/18/1951

## 2017-04-19 ENCOUNTER — Ambulatory Visit: Payer: PPO | Admitting: Physical Therapy

## 2017-05-06 ENCOUNTER — Ambulatory Visit: Payer: PPO | Admitting: Physical Therapy

## 2017-05-09 ENCOUNTER — Encounter: Payer: Self-pay | Admitting: Physical Therapy

## 2017-05-09 ENCOUNTER — Ambulatory Visit: Payer: PPO | Attending: Internal Medicine | Admitting: Physical Therapy

## 2017-05-09 DIAGNOSIS — R252 Cramp and spasm: Secondary | ICD-10-CM | POA: Diagnosis not present

## 2017-05-09 DIAGNOSIS — R2681 Unsteadiness on feet: Secondary | ICD-10-CM | POA: Diagnosis not present

## 2017-05-09 DIAGNOSIS — M6281 Muscle weakness (generalized): Secondary | ICD-10-CM | POA: Insufficient documentation

## 2017-05-09 DIAGNOSIS — G8929 Other chronic pain: Secondary | ICD-10-CM | POA: Insufficient documentation

## 2017-05-09 DIAGNOSIS — M545 Low back pain: Secondary | ICD-10-CM | POA: Insufficient documentation

## 2017-05-09 NOTE — Therapy (Addendum)
[place], [place], [address] Phone: [phone]   Fax:  [phone]  Physical Therapy Treatment / Re-certification / Discharge Summary  Patient Details  Name: [name] [name] MRN: [id number] Date of Birth: 1950-10-22 Referring Provider: Burnard Bunting, MD   Encounter Date: 05/09/2017  PT End of Session - 05/09/17 1151    Visit Number  8    Number of Visits  13    Date for PT Re-Evaluation  05/30/17    Authorization Type  MCR: Kx mod by 15th visit, Progress note by 10th visit.     PT Start Time  1146    PT Stop Time  1235    PT Time Calculation (min)  49 min    Activity Tolerance  Patient tolerated treatment well    Behavior During Therapy  WFL for tasks assessed/performed       Past Medical History:  Diagnosis Date  . Anxiety and depression   . Exogenous obesity   . Fibromyalgia   . Hyperlipidemia   . Hypothyroidism   . Morbid obesity ([place])   . OSA (obstructive sleep apnea)    uses CPAP  . Paroxysmal atrial flutter ([place])   . Periodic limb movement disorder   . REM sleep behavior disorder   . Rhinitis   . SOB (shortness of breath)   . Spinal stenosis   . Unspecified hypothyroidism     Past Surgical History:  Procedure Laterality Date  . CARDIOVERSION N/A 04/29/2015   Procedure: CARDIOVERSION;  Surgeon: Adrian Prows, MD;  Location: [facility];  Service: Cardiovascular;  Laterality: N/A;  . CARPAL TUNNEL RELEASE     Bilateral  . DILATION AND CURETTAGE OF UTERUS    . left rotator cuff    . resection uterine polyp  2011    There were no vitals filed for this visit.  Subjective Assessment - 05/09/17 1149    Subjective  " the back is doing pretty good, one little spot that bothers me if I sit  for alittle bit"     Currently in Pain?  Yes    Pain Score  2     Pain Location  Back    Pain Orientation  Right    Pain Onset  More than a month ago    Pain Frequency  Intermittent    Aggravating Factors   sitting    Pain Relieving Factors  exercise.          [facility] PT Assessment - 05/09/17 1157      Observation/Other Assessments   Focus on Therapeutic Outcomes (FOTO)   31% limited      AROM   Lumbar Flexion  75    Lumbar Extension  30    Lumbar - Right Side Bend  25    Lumbar - Left Side Bend  30      Strength   Right Hip Flexion  4+/5    Right Hip Extension  4/5    Right Hip ABduction  4/5    Right Hip ADduction  5/5    Left Hip Flexion  4+/5    Left Hip Extension  4/5    Left Hip ABduction  4/5    Left Hip ADduction  5/5    Right Knee Flexion  5/5    Right Knee Extension  5/5                  [facility] Adult PT Treatment/Exercise - 05/09/17 1240  Self-Care   Other Self-Care Comments   using tennis ball for MTPR for the piriformis and how to stretch piriformis      Lumbar Exercises: Stretches   Active Hamstring Stretch  2 reps;30 seconds in sitting      Knee/Hip Exercises: Stretches   Piriformis Stretch  2 reps;30 seconds;Right in sitting      Knee/Hip Exercises: Standing   Hip Abduction  3 sets;Knee straight;Both with red theraband going to fatigue    Abduction Limitations  verbal cues for proper posture during exercise    Hip Extension  2 sets;Knee straight with red theraband working into endurance    Extension Limitations  proper posture with exercise      Manual Therapy   Manual therapy comments  MTPR along R piriformis x 3             PT Education - 05/09/17 1231    Education provided  Yes    Education Details  updated HEP and reviewed  previously provided HEP.     Person(s) Educated  Patient    Methods  Explanation;Verbal cues    Comprehension  Verbalized understanding;Verbal cues required       PT Short Term Goals - 05/09/17 1209      PT SHORT TERM GOAL #1   Title  pt to be I with inital HEp     Time  3    Period  Weeks    Status  Achieved      PT SHORT TERM GOAL #2   Title  pt to demo/ verbalize proper  posture during sitting/ standing and lifting and utliizes breaks appropriately to prevent/ reduce low back pain/ tightness    Time  3    Period  Weeks    Status  Achieved        PT Long Term Goals - 05/09/17 1210      PT LONG TERM GOAL #1   Title  improve bil hip overall strength to >/= 4/5 to promote hip stability with walking/ standing activities and promte safety    Time  6    Period  Weeks      PT LONG TERM GOAL #2   Title  pt to be able to stand and walk >/= 1 hour with no report of pain or feeling unsteady for function endurance required for ADLs     Time  6    Period  Weeks    Status  Achieved      PT LONG TERM GOAL #3   Title  pt to increase FOTO score to </= 41% limited to demo improvement in fucntion    Time  6    Period  Weeks    Status  Achieved      PT LONG TERM GOAL #4   Title  pt to be able to return to gym exercise with </= 1/10 pain to promote pt personal    Time  6    Period  Weeks    Status  On-going      PT LONG TERM GOAL #5   Title  pt to be I with all HEp given as of last visit     Time  6    Period  Weeks    Status  On-going            Plan - 05/09/17 1242    Clinical Impression Statement  [name] [name] reprots significant improvement in pain in the low back and demonstrates improvement  of trunk mobility and hip strength. She is progressing well with goals. she performed hip strengthening exercises with focus on endurance training to promtoe hip stability she reported no pain with bending or standing. plan to assess pt progress in 2 weeks to assess independent exercise and if more treatment is warrented or if she should be discharged.     PT Frequency  1x / week    PT Duration  2 weeks    PT Treatment/Interventions  ADLs/Self Care Home Management;Electrical Stimulation;Iontophoresis '4mg'$ /ml Dexamethasone;Moist Heat;Ultrasound;Taping;Manual techniques;Therapeutic exercise;Therapeutic activities;Neuromuscular re-education;Patient/family education;Dry  needling    PT Next Visit Plan  DN PRN, manual PRN, trial manual traction, core strengthening/ hip strengthening.     PT Home Exercise Plan  hamstring stretching, SLR, pelvic tilt (in supine), LTR, sidelying hip abduction, posture mechahnics    Consulted and Agree with Plan of Care  Patient       Patient will benefit from skilled therapeutic intervention in order to improve the following deficits and impairments:  Pain, Obesity, Decreased strength, Decreased activity tolerance, Decreased balance, Improper body mechanics, Postural dysfunction, Increased fascial restricitons  Visit Diagnosis: Chronic bilateral low back pain, with sciatica presence unspecified  Cramp and spasm  Muscle weakness (generalized)  Unsteadiness     Problem List Patient Active Problem List   Diagnosis Date Noted  . Sleep talking 06/04/2015  . SOB (shortness of breath)   . Morbid obesity ([place])   . Fibromyalgia   . Hyperlipidemia   . Hypothyroidism   . Paroxysmal atrial flutter ([place])   . ANXIETY DEPRESSION 04/15/2007  . REM SLEEP BEHAVIOR DISORDER 04/15/2007  . UNSPECIFIED HYPOTHYROIDISM 04/14/2007  . EXOGENOUS OBESITY 02/10/2007  . OBSTRUCTIVE SLEEP APNEA 02/10/2007  . PERIODIC LIMB MOVEMENT DISORDER 02/10/2007  . RHINITIS 02/10/2007   Starr Lake PT, DPT, LAT, ATC  05/09/17  12:47 PM      [facility] [address] [place], [place], [address] Phone: [phone]   Fax:  [phone]  Name: [name] [name] MRN: [id number] Date of Birth: 1950/05/08       PHYSICAL THERAPY DISCHARGE SUMMARY  Visits from Start of Care: 8  Current functional level related to goals / functional outcomes: See goals   Remaining deficits: Pt reported she is doing better and feels she can discharge from PT.   Education / Equipment: HEP, theraband, posture, lifting mechanics  Plan: Patient agrees to discharge.  Patient goals were met. Patient is being  discharged due to being pleased with the current functional level.  ?????          Wiliam Cauthorn PT, DPT, LAT, ATC  05/25/17  8:58 AM

## 2017-05-11 ENCOUNTER — Ambulatory Visit: Payer: PPO | Admitting: Physical Therapy

## 2017-05-18 DIAGNOSIS — I484 Atypical atrial flutter: Secondary | ICD-10-CM | POA: Diagnosis not present

## 2017-05-25 ENCOUNTER — Ambulatory Visit: Payer: PPO | Admitting: Physical Therapy

## 2017-06-23 DIAGNOSIS — N762 Acute vulvitis: Secondary | ICD-10-CM | POA: Diagnosis not present

## 2017-07-12 DIAGNOSIS — Z1231 Encounter for screening mammogram for malignant neoplasm of breast: Secondary | ICD-10-CM | POA: Diagnosis not present

## 2017-07-12 DIAGNOSIS — Z01419 Encounter for gynecological examination (general) (routine) without abnormal findings: Secondary | ICD-10-CM | POA: Diagnosis not present

## 2017-07-12 DIAGNOSIS — Z6841 Body Mass Index (BMI) 40.0 and over, adult: Secondary | ICD-10-CM | POA: Diagnosis not present

## 2017-07-12 DIAGNOSIS — N958 Other specified menopausal and perimenopausal disorders: Secondary | ICD-10-CM | POA: Diagnosis not present

## 2017-07-18 ENCOUNTER — Other Ambulatory Visit: Payer: Self-pay | Admitting: Orthopedic Surgery

## 2017-07-18 DIAGNOSIS — M545 Low back pain: Secondary | ICD-10-CM | POA: Diagnosis not present

## 2017-07-18 DIAGNOSIS — M25551 Pain in right hip: Secondary | ICD-10-CM | POA: Diagnosis not present

## 2017-07-24 ENCOUNTER — Other Ambulatory Visit: Payer: PPO

## 2017-07-24 ENCOUNTER — Ambulatory Visit
Admission: RE | Admit: 2017-07-24 | Discharge: 2017-07-24 | Disposition: A | Discharge status: Home / Self Care | Payer: PPO | Source: Ambulatory Visit | Attending: Orthopedic Surgery | Admitting: Orthopedic Surgery

## 2017-07-24 DIAGNOSIS — M545 Low back pain: Secondary | ICD-10-CM

## 2017-07-24 DIAGNOSIS — M48061 Spinal stenosis, lumbar region without neurogenic claudication: Secondary | ICD-10-CM | POA: Diagnosis not present

## 2017-08-12 DIAGNOSIS — R002 Palpitations: Secondary | ICD-10-CM | POA: Diagnosis not present

## 2017-08-16 DIAGNOSIS — M4726 Other spondylosis with radiculopathy, lumbar region: Secondary | ICD-10-CM | POA: Diagnosis not present

## 2017-08-16 DIAGNOSIS — Q762 Congenital spondylolisthesis: Secondary | ICD-10-CM | POA: Diagnosis not present

## 2017-08-25 DIAGNOSIS — M47816 Spondylosis without myelopathy or radiculopathy, lumbar region: Secondary | ICD-10-CM | POA: Diagnosis not present

## 2017-08-25 DIAGNOSIS — Q762 Congenital spondylolisthesis: Secondary | ICD-10-CM | POA: Diagnosis not present

## 2017-08-29 DIAGNOSIS — R531 Weakness: Secondary | ICD-10-CM | POA: Diagnosis not present

## 2017-08-29 DIAGNOSIS — R000 Tachycardia, unspecified: Secondary | ICD-10-CM | POA: Diagnosis not present

## 2017-08-29 DIAGNOSIS — E038 Other specified hypothyroidism: Secondary | ICD-10-CM | POA: Diagnosis not present

## 2017-08-29 DIAGNOSIS — Z6841 Body Mass Index (BMI) 40.0 and over, adult: Secondary | ICD-10-CM | POA: Diagnosis not present

## 2017-08-29 DIAGNOSIS — R42 Dizziness and giddiness: Secondary | ICD-10-CM | POA: Diagnosis not present

## 2017-08-29 DIAGNOSIS — F419 Anxiety disorder, unspecified: Secondary | ICD-10-CM | POA: Diagnosis not present

## 2017-08-29 DIAGNOSIS — E7849 Other hyperlipidemia: Secondary | ICD-10-CM | POA: Diagnosis not present

## 2017-08-29 DIAGNOSIS — G4733 Obstructive sleep apnea (adult) (pediatric): Secondary | ICD-10-CM | POA: Diagnosis not present

## 2017-08-29 DIAGNOSIS — M5416 Radiculopathy, lumbar region: Secondary | ICD-10-CM | POA: Diagnosis not present

## 2017-11-16 DIAGNOSIS — R002 Palpitations: Secondary | ICD-10-CM | POA: Diagnosis not present

## 2017-11-16 DIAGNOSIS — I484 Atypical atrial flutter: Secondary | ICD-10-CM | POA: Diagnosis not present

## 2017-11-16 DIAGNOSIS — E7800 Pure hypercholesterolemia, unspecified: Secondary | ICD-10-CM | POA: Diagnosis not present

## 2017-11-18 DIAGNOSIS — I484 Atypical atrial flutter: Secondary | ICD-10-CM | POA: Diagnosis not present

## 2017-11-18 DIAGNOSIS — E7800 Pure hypercholesterolemia, unspecified: Secondary | ICD-10-CM | POA: Diagnosis not present

## 2018-02-23 DIAGNOSIS — R82998 Other abnormal findings in urine: Secondary | ICD-10-CM | POA: Diagnosis not present

## 2018-02-23 DIAGNOSIS — Z79899 Other long term (current) drug therapy: Secondary | ICD-10-CM | POA: Diagnosis not present

## 2018-02-23 DIAGNOSIS — E559 Vitamin D deficiency, unspecified: Secondary | ICD-10-CM | POA: Diagnosis not present

## 2018-02-23 DIAGNOSIS — E038 Other specified hypothyroidism: Secondary | ICD-10-CM | POA: Diagnosis not present

## 2018-03-01 DIAGNOSIS — M5416 Radiculopathy, lumbar region: Secondary | ICD-10-CM | POA: Diagnosis not present

## 2018-03-01 DIAGNOSIS — Z1389 Encounter for screening for other disorder: Secondary | ICD-10-CM | POA: Diagnosis not present

## 2018-03-01 DIAGNOSIS — E038 Other specified hypothyroidism: Secondary | ICD-10-CM | POA: Diagnosis not present

## 2018-03-01 DIAGNOSIS — Z8679 Personal history of other diseases of the circulatory system: Secondary | ICD-10-CM | POA: Diagnosis not present

## 2018-03-01 DIAGNOSIS — G4733 Obstructive sleep apnea (adult) (pediatric): Secondary | ICD-10-CM | POA: Diagnosis not present

## 2018-03-01 DIAGNOSIS — R531 Weakness: Secondary | ICD-10-CM | POA: Diagnosis not present

## 2018-03-01 DIAGNOSIS — Z23 Encounter for immunization: Secondary | ICD-10-CM | POA: Diagnosis not present

## 2018-03-01 DIAGNOSIS — Z0000 Encounter for general adult medical examination without abnormal findings: Secondary | ICD-10-CM | POA: Diagnosis not present

## 2018-03-01 DIAGNOSIS — E7849 Other hyperlipidemia: Secondary | ICD-10-CM | POA: Diagnosis not present

## 2018-03-01 DIAGNOSIS — F419 Anxiety disorder, unspecified: Secondary | ICD-10-CM | POA: Diagnosis not present

## 2018-03-01 DIAGNOSIS — M797 Fibromyalgia: Secondary | ICD-10-CM | POA: Diagnosis not present

## 2018-03-07 DIAGNOSIS — Z1212 Encounter for screening for malignant neoplasm of rectum: Secondary | ICD-10-CM | POA: Diagnosis not present

## 2018-05-23 DIAGNOSIS — H25093 Other age-related incipient cataract, bilateral: Secondary | ICD-10-CM | POA: Diagnosis not present

## 2018-05-23 DIAGNOSIS — H5203 Hypermetropia, bilateral: Secondary | ICD-10-CM | POA: Diagnosis not present

## 2018-05-23 DIAGNOSIS — H524 Presbyopia: Secondary | ICD-10-CM | POA: Diagnosis not present

## 2018-05-23 DIAGNOSIS — H52223 Regular astigmatism, bilateral: Secondary | ICD-10-CM | POA: Diagnosis not present

## 2018-05-31 DIAGNOSIS — I10 Essential (primary) hypertension: Secondary | ICD-10-CM | POA: Diagnosis not present

## 2018-05-31 DIAGNOSIS — E7800 Pure hypercholesterolemia, unspecified: Secondary | ICD-10-CM | POA: Diagnosis not present

## 2018-05-31 DIAGNOSIS — R002 Palpitations: Secondary | ICD-10-CM | POA: Diagnosis not present

## 2018-05-31 DIAGNOSIS — I484 Atypical atrial flutter: Secondary | ICD-10-CM | POA: Diagnosis not present

## 2018-06-05 ENCOUNTER — Telehealth: Payer: Self-pay

## 2018-06-05 NOTE — Telephone Encounter (Signed)
Lets have her stop the medication and monitor for the next few days. If her blood pressure increases, we can just use plain dose of Olmesartan.

## 2018-06-05 NOTE — Telephone Encounter (Signed)
Previous documentation in [employer] on 06/02/18. Pt called back stating that  She tried 1/2 tab but bp was still running 88/50, 87/42. She called her daughter whom is a Engineer, civil (consulting) and she recommended she take 1/4 of a tab which pt did and her bp came up to 109/60, 111/58. Please review and advise.//ah

## 2018-06-09 ENCOUNTER — Other Ambulatory Visit: Payer: Self-pay | Admitting: Cardiology

## 2018-06-09 DIAGNOSIS — I484 Atypical atrial flutter: Secondary | ICD-10-CM | POA: Diagnosis not present

## 2018-06-10 LAB — BASIC METABOLIC PANEL
BUN/Creatinine Ratio: 20 (ref 12–28)
BUN: 17 mg/dL (ref 8–27)
CO2: 24 mmol/L (ref 20–29)
Calcium: 9.2 mg/dL (ref 8.7–10.3)
Chloride: 102 mmol/L (ref 96–106)
Creatinine, Ser: 0.86 mg/dL (ref 0.57–1.00)
GFR calc Af Amer: 80 mL/min/{1.73_m2} (ref >59)
GFR calc non Af Amer: 70 mL/min/{1.73_m2} (ref >59)
Glucose: 79 mg/dL (ref 65–99)
Potassium: 4.3 mmol/L (ref 3.5–5.2)
Sodium: 142 mmol/L (ref 134–144)

## 2018-06-10 LAB — CBC WITH DIFFERENTIAL/PLATELET
Basophils Absolute: 0.1 10*3/uL (ref 0.0–0.2)
Basos: 1 %
EOS (ABSOLUTE): 0.3 10*3/uL (ref 0.0–0.4)
Eos: 4 %
Hematocrit: 39.4 % (ref 34.0–46.6)
Hemoglobin: 13.1 g/dL (ref 11.1–15.9)
Immature Grans (Abs): 0 10*3/uL (ref 0.0–0.1)
Immature Granulocytes: 0 %
Lymphocytes Absolute: 1.3 10*3/uL (ref 0.7–3.1)
Lymphs: 21 %
MCH: 30.1 pg (ref 26.6–33.0)
MCHC: 33.2 g/dL (ref 31.5–35.7)
MCV: 91 fL (ref 79–97)
Monocytes Absolute: 0.5 10*3/uL (ref 0.1–0.9)
Monocytes: 8 %
Neutrophils Absolute: 4.2 10*3/uL (ref 1.4–7.0)
Neutrophils: 66 %
Platelets: 214 10*3/uL (ref 150–450)
RBC: 4.35 x10E6/uL (ref 3.77–5.28)
RDW: 12.6 % (ref 11.7–15.4)
WBC: 6.4 10*3/uL (ref 3.4–10.8)

## 2018-06-20 ENCOUNTER — Telehealth: Payer: Self-pay

## 2018-06-20 NOTE — Telephone Encounter (Signed)
Pt aware.

## 2018-06-20 NOTE — Telephone Encounter (Signed)
-----   Message from Toniann Fail, NP sent at 06/17/2018 11:12 AM EST ----- CBC along with kidney function and electrolytes are all normal.

## 2018-06-22 ENCOUNTER — Other Ambulatory Visit: Payer: Self-pay | Admitting: Cardiology

## 2018-06-27 ENCOUNTER — Other Ambulatory Visit: Payer: Self-pay

## 2018-06-27 ENCOUNTER — Encounter (HOSPITAL_COMMUNITY): Payer: Self-pay | Admitting: Emergency Medicine

## 2018-06-27 ENCOUNTER — Emergency Department (HOSPITAL_COMMUNITY)
Admission: EM | Admit: 2018-06-27 | Discharge: 2018-06-28 | Disposition: A | Discharge status: Home / Self Care | Payer: PPO | Attending: Emergency Medicine | Admitting: Emergency Medicine

## 2018-06-27 DIAGNOSIS — Z7901 Long term (current) use of anticoagulants: Secondary | ICD-10-CM | POA: Diagnosis not present

## 2018-06-27 DIAGNOSIS — E039 Hypothyroidism, unspecified: Secondary | ICD-10-CM | POA: Diagnosis not present

## 2018-06-27 DIAGNOSIS — Y999 Unspecified external cause status: Secondary | ICD-10-CM | POA: Insufficient documentation

## 2018-06-27 DIAGNOSIS — S81811A Laceration without foreign body, right lower leg, initial encounter: Secondary | ICD-10-CM | POA: Diagnosis present

## 2018-06-27 DIAGNOSIS — Z79899 Other long term (current) drug therapy: Secondary | ICD-10-CM | POA: Insufficient documentation

## 2018-06-27 DIAGNOSIS — Y939 Activity, unspecified: Secondary | ICD-10-CM | POA: Diagnosis not present

## 2018-06-27 DIAGNOSIS — I83891 Varicose veins of right lower extremities with other complications: Secondary | ICD-10-CM | POA: Diagnosis not present

## 2018-06-27 DIAGNOSIS — Y92003 Bedroom of unspecified non-institutional (private) residence as the place of occurrence of the external cause: Secondary | ICD-10-CM | POA: Insufficient documentation

## 2018-06-27 DIAGNOSIS — W268XXA Contact with other sharp object(s), not elsewhere classified, initial encounter: Secondary | ICD-10-CM | POA: Diagnosis not present

## 2018-06-27 DIAGNOSIS — I83899 Varicose veins of unspecified lower extremities with other complications: Secondary | ICD-10-CM

## 2018-06-27 DIAGNOSIS — I83811 Varicose veins of right lower extremities with pain: Secondary | ICD-10-CM | POA: Diagnosis not present

## 2018-06-27 NOTE — ED Provider Notes (Signed)
[facility]-EMERGENCY DEPT Provider Note   CSN: [id number] Arrival date & time: 06/27/18  2053    History   Chief Complaint Chief Complaint  Patient presents with  . Laceration    HPI [name] [name] is a 68 y.o. female.     HPI   [name] [name] is a 68 y.o. female, with a history of atrial flutter, fibromyalgia, hyperlipidemia, obesity, hypothyroidism,, presenting to the ED with wound to the right leg.  States she was shaving her leg this evening around 8 PM, excellently cut a spider vein on her right anterior lower leg.  She was able to stop the bleeding with pressure and it has not recurred.  She is anticoagulated on Xarelto for atrial flutter. Denies leg pain, numbness, leg swelling, other injuries.     Past Medical History:  Diagnosis Date  . Anxiety and depression   . Exogenous obesity   . Fibromyalgia   . Hyperlipidemia   . Hypothyroidism   . Morbid obesity (HCC)   . OSA (obstructive sleep apnea)    uses CPAP  . Paroxysmal atrial flutter (HCC)   . Periodic limb movement disorder   . REM sleep behavior disorder   . Rhinitis   . SOB (shortness of breath)   . Spinal stenosis   . Unspecified hypothyroidism     Patient Active Problem List   Diagnosis Date Noted  . Sleep talking 06/04/2015  . SOB (shortness of breath)   . Morbid obesity (HCC)   . Fibromyalgia   . Hyperlipidemia   . Hypothyroidism   . Paroxysmal atrial flutter (HCC)   . ANXIETY DEPRESSION 04/15/2007  . REM SLEEP BEHAVIOR DISORDER 04/15/2007  . UNSPECIFIED HYPOTHYROIDISM 04/14/2007  . EXOGENOUS OBESITY 02/10/2007  . OBSTRUCTIVE SLEEP APNEA 02/10/2007  . PERIODIC LIMB MOVEMENT DISORDER 02/10/2007  . RHINITIS 02/10/2007    Past Surgical History:  Procedure Laterality Date  . CARDIOVERSION N/A 04/29/2015   Procedure: CARDIOVERSION;  Surgeon: Yates Decamp, MD;  Location: [facility] ENDOSCOPY;  Service: Cardiovascular;  Laterality: N/A;  . CARPAL TUNNEL RELEASE     Bilateral  .  DILATION AND CURETTAGE OF UTERUS    . left rotator cuff    . resection uterine polyp  2011     OB History   No obstetric history on file.      Home Medications    Prior to Admission medications   Medication Sig Start Date End Date Taking? Authorizing Provider  dabigatran (PRADAXA) 150 MG CAPS capsule Take 150 mg by mouth 2 (two) times daily.    [provider]  diltiazem (CARDIZEM CD) 120 MG 24 hr capsule Take 120 mg by mouth daily. 04/08/15   [provider]  levothyroxine (SYNTHROID, LEVOTHROID) 200 MCG tablet Take 200 mcg by mouth daily.    [provider]  metoprolol tartrate (LOPRESSOR) 25 MG tablet Take 25 mg by mouth 3 (three) times daily.    [provider]  olmesartan-hydrochlorothiazide (BENICAR HCT) 20-12.5 MG tablet TAKE 1 (ONE) TABLET DAILY IN THE MORNING 06/22/18   Yates Decamp, MD  ROSUVASTATIN CALCIUM PO Take 10 mg daily by mouth.    [provider]  triamterene-hydrochlorothiazide (DYAZIDE) 37.5-25 MG capsule Take 1 each (1 capsule total) by mouth daily. Patient not taking: Reported on 03/11/2017 04/30/15   Lyndal Pulley, MD    Family History Family History  Adopted: Yes  Problem Relation Age of Onset  . Cervical cancer Mother     Social History Social History  Tobacco Use  . Smoking status: Never Smoker  . Smokeless tobacco: Never Used  Substance Use Topics  . Alcohol use: No  . Drug use: No     Allergies   Codeine and Morphine   Review of Systems Review of Systems  Skin: Positive for wound.  Neurological: Negative for numbness.     Physical Exam Updated Vital Signs BP (!) 153/80 (BP Location: Left Arm)   Pulse 89   Temp 98.3 F (36.8 C) (Oral)   Resp 18   Ht 5\' 6"  (1.676 m)   Wt 113.4 kg   SpO2 100%   BMI 40.35 kg/m   Physical Exam Vitals signs and nursing note reviewed.  Constitutional:      General: She is not in acute distress.    Appearance: She is well-developed. She is not  diaphoretic.  HENT:     Head: Normocephalic and atraumatic.  Eyes:     Conjunctiva/sclera: Conjunctivae normal.  Neck:     Musculoskeletal: Neck supple.  Cardiovascular:     Rate and Rhythm: Normal rate and regular rhythm.     Pulses:          Posterior tibial pulses are 2+ on the right side and 2+ on the left side.  Pulmonary:     Effort: Pulmonary effort is normal.  Skin:    General: Skin is warm and dry.     Coloration: Skin is not pale.     Comments: There is a small pinpoint wound to the right anterior lower leg.  No active hemorrhage.  No swelling, color change, or tenderness.  Neurological:     Mental Status: She is alert.  Psychiatric:        Behavior: Behavior normal.          ED Treatments / Results  Labs (all labs ordered are listed, but only abnormal results are displayed) Labs Reviewed - No data to display  EKG None  Radiology No results found.  Procedures Procedures (including critical care time)  Medications Ordered in ED Medications - No data to display   Initial Impression / Assessment and Plan / ED Course  I have reviewed the triage vital signs and the nursing notes.  Pertinent labs & imaging results that were available during my care of the patient were reviewed by me and considered in my medical decision making (see chart for details).        Patient presents for evaluation of a wound.  She was able to stop the bleeding prior to arrival and this has not recurred.  The wound was cleaned here in the ED, Dermabond was applied to prevent rebleeding, and a bandage with light pressure was applied.  Pulse, motor, and sensory intact before and after application of the bandage. The patient was given instructions for home care as well as return precautions. Patient voices understanding of these instructions, accepts the plan, and is comfortable with discharge.  Findings and plan of care discussed with Shaune Pollack, MD. Dr. Erma Heritage personally  evaluated and examined this patient.  Vitals:   06/27/18 2116 06/27/18 2117 06/28/18 0002  BP: (!) 153/80  (!) 143/69  Pulse: 89  90  Resp: 18  15  Temp: 98.3 F (36.8 C)    TempSrc: Oral    SpO2: 100%  99%  Weight:  113.4 kg   Height:  5\' 6"  (1.676 m)      Final Clinical Impressions(s) / ED Diagnoses   Final diagnoses:  Bleeding from varicose  vein    ED Discharge Orders    None       Concepcion Living 06/29/18 0840    Shaune Pollack, MD 06/29/18 1346

## 2018-06-27 NOTE — ED Triage Notes (Addendum)
Pt states that that she was shaving her legs (rt lower leg) and hit a spider vein that bled continuously.  Pt takes xarelto.  Bleeding is currently controlled at this time with bandage.

## 2018-06-27 NOTE — Discharge Instructions (Signed)
Wound Care - Dermabond  Your wound has been closed with a medical-grade glue called Dermabond. Please adhere to the following wound care instructions:  You may shower, but avoid submerging the wound. Do not scrub the wound, as this may cause the glue to wear off prematurely.    The glue will wear off on its own, usually within 5-10 days. During this time period DO NOT apply antibiotic ointments or any other ointments or lotions to the area as this can cause the glue to wear off prematurely.  After 10 days, you may apply ointments, such as Neosporin, to the area to help the remaining glue to wear off.   After the wound has healed and the glue is gone, you may apply ointments such as Aquaphor to the wound to reduce scarring.  May use Tylenol for pain.  Return to the ED should signs of infection arise, such as spreading redness, puffiness/swelling, pus draining from the wound, severe increase in pain, or any other major issues.  For prescription assistance, may try using prescription discount sites or apps, such as goodrx.com

## 2018-06-28 NOTE — ED Notes (Signed)
Patient verbalized understanding of DC instructions and ways to care for Dermabond.

## 2018-07-03 ENCOUNTER — Other Ambulatory Visit: Payer: Self-pay | Admitting: Cardiology

## 2018-07-11 NOTE — Progress Notes (Signed)
Subjective:   [name] [name], female    DOB: 1951/01/11, 68 y.o.   MRN: [id number]  Burnard Bunting, MD:  Chief Complaint  Patient presents with  . Hypertension  . Follow-up    HPI: [name] [name]  is a 68 y.o. female  with paroxysmal atypical atrial flutter.   Patient was last seen 6 weeks ago for 68-monthfollow-up for paroxysmal atrial flutter and due to hypertension she was started on olmesartan hydrochlorothiazide.  During the interim she had episodes of hypotension that was associated with fatigue and lightheadedness.  Symptoms persisted even with taking half a tablet.  She now presents for follow-up.  She has not had any recent palpitations and states that she is feeling well. She has been making diet changes and is now following a new diet plan that eliminates sugar intake. She continues to lose weight. Was seen in the ER on 02/25 for bleeding varicose vein after accidentally cutting while shaving. Area was closed with dermabond and has not had any recurrent bleeding. She has been monitoring her BP at home and has ranged from the 1387-564systolic.   She is complaining of itching in bilateral skin folds of her bilateral arms and behind her knee. Seems to be worse at night and after taking hot bath. She has not changed any detergents or soap.   She has chronic back, no chest pain or shortness of breath, tolerating anticoagulation well. She occasionally has brief episodes of dizziness that do not occur daily. No syncope.    Past Medical History:  Diagnosis Date  . Anxiety and depression   . Aortic arch atherosclerosis ([facility] 07/13/2018  . Exogenous obesity   . Fibromyalgia   . Hyperlipidemia   . Hypothyroidism   . Morbid [place] ([place]   . OSA (obstructive sleep apnea)    uses CPAP  . Palpitations 07/13/2018  . Paroxysmal atrial flutter ([facility]   . Periodic limb movement disorder   . REM sleep behavior disorder   . Rhinitis   . SOB (shortness of breath)   . Spinal  stenosis   . Unspecified hypothyroidism     Past Surgical History:  Procedure Laterality Date  . CARDIOVERSION N/A 04/29/2015   Procedure: CARDIOVERSION;  Surgeon: JAdrian Prows MD;  [place] [facility]  Service: Cardiovascular;  Laterality: N/A;  . CARPAL TUNNEL RELEASE     Bilateral  . DILATION AND CURETTAGE OF UTERUS    . left rotator cuff    . resection uterine polyp  2011    Family History  Adopted: Yes  Problem Relation Age of Onset  . Cervical cancer Mother     Social History   Socioeconomic History  . Marital status: Married    Spouse name: Not on file  . Number of children: 2  . Years of education: Not on file  . Highest education level: Not on file  Occupational History  . Not on file  Social Needs  . Financial resource strain: Not on file  . Food insecurity:    Worry: Not on file    Inability: Not on file  . Transportation needs:    Medical: Not on file    Non-medical: Not on file  Tobacco Use  . Smoking status: Never Smoker  . Smokeless tobacco: Never Used  Substance and Sexual Activity  . Alcohol use: No  . Drug use: No  . Sexual activity: Not on file  Lifestyle  . Physical activity:    Days  per week: Not on file    Minutes per session: Not on file  . Stress: Not on file  Relationships  . Social connections:    Talks on phone: Not on file    Gets together: Not on file    Attends religious service: Not on file    Active member of club or organization: Not on file    Attends meetings of clubs or organizations: Not on file    Relationship status: Not on file  . Intimate partner violence:    Fear of current or ex partner: Not on file    Emotionally abused: Not on file    Physically abused: Not on file    Forced sexual activity: Not on file  Other Topics Concern  . Not on file  Social History Narrative  . Not on file    Current Meds  Medication Sig  . levothyroxine (SYNTHROID, LEVOTHROID) 200 MCG tablet Take 175 mcg by mouth daily.   .  metoprolol tartrate (LOPRESSOR) 25 MG tablet Take 25 mg by mouth 3 (three) times daily.  . rivaroxaban (XARELTO) 20 MG TABS tablet Take 20 mg by mouth daily.  Marland Kitchen ROSUVASTATIN CALCIUM PO Take 10 mg daily by mouth.     Review of Systems  Constitution: Negative for decreased appetite, malaise/fatigue, weight gain and weight loss.  Eyes: Negative for visual disturbance.  Cardiovascular: Positive for dyspnea on exertion. Negative for chest pain, claudication, leg swelling, orthopnea, palpitations and syncope.  Respiratory: Negative for hemoptysis and wheezing.   Endocrine: Negative for cold intolerance and heat intolerance.  Hematologic/Lymphatic: Does not bruise/bleed easily.  Skin: Positive for itching (bilateral skin folds of upper and lower extremities). Negative for nail changes.  Musculoskeletal: Positive for back pain. Negative for muscle weakness and myalgias.  Gastrointestinal: Negative for abdominal pain, change in bowel habit, nausea and vomiting.  Neurological: Positive for dizziness (occasional). Negative for difficulty with concentration, focal weakness and headaches.  Psychiatric/Behavioral: Negative for altered mental status and suicidal ideas.  All other systems reviewed and are negative.      Objective:     Blood pressure (!) 142/78, pulse 66, height '5\' 4"'$  (1.626 m), weight 253 lb (114.8 kg), SpO2 98 %.  Cardiac studies:  Echocardiogram 03/18/2015: 1. Left ventricle cavity is normal in size. Normal global wall motion. Visual EF is 60-65%. 2. Left atrial cavity is mildly dilated. 3. Trace aortic regurgitation. 4. Mild mitral regurgitation. 5. Mild tricuspid regurgitation. No evidence of pulmonary hypertension.  EKG 05/31/2018: Normal sinus rhythm at 60 bpm, normal axis, no evidence of ischemia. Normal EKG.  Sleep Study 2006: Used CPAP. Repeat sleep study. 05/28/2015 by Dr. Caryl Comes: No e/o sleep apnea.  Chest x-ray 04/30/2015: Mild CHF, atherosclerotic calcification the  aortic arch.  Direct current cardioversion 04/29/2015: A. Flutter to NSR with 75J x 1.  Treadmill Exercise stress 10/11/2014: Indication:Palpitations, Chest pain The patient exercised according to Bruce Protocol, Total time recorded 04:80mn achieving max heart rate of 141 which was 90% of MPHR for age and 5.80 METS of work. Normal BP response. Resting ECG NSR, low voltage complexes. There was no ST-T changes of ischemia with exercise stress test. Stress terminated due to THR (>85% MPHR)/MPHR met and fatigue. Decreased exercise tolerence.    Physical Exam  Constitutional: She appears well-developed. No distress.  Morbidly obese  HENT:  Head: Atraumatic.  Eyes: Conjunctivae are normal.  Neck: Neck supple. No thyromegaly present.  Short neck and difficult to evaluate JVP  Cardiovascular: Normal rate, regular  rhythm and normal heart sounds. Exam reveals no gallop.  No murmur heard. Pulses:      Carotid pulses are 2+ on the right side and 2+ on the left side.      Dorsalis pedis pulses are 2+ on the right side and 2+ on the left side.       Posterior tibial pulses are 2+ on the right side and 2+ on the left side.  Femoral and popliteal pulse difficult to feel due to patient's body habitus.   Pulmonary/Chest: Effort normal and breath sounds normal.  Abdominal: Soft. Bowel sounds are normal.  Musculoskeletal: Normal range of motion.        General: No edema.  Neurological: She is alert.  Skin: Skin is warm and dry.  Psychiatric: She has a normal mood and affect.    Recent Labs:  CBC Latest Ref Rng & Units 06/09/2018  WBC 3.4 - 10.8 x10E3/uL 6.4  Hemoglobin 11.1 - 15.9 g/dL 13.1  Hematocrit 34.0 - 46.6 % 39.4  Platelets 150 - 450 x10E3/uL 214   CMP Latest Ref Rng & Units 06/09/2018  Glucose 65 - 99 mg/dL 79  BUN 8 - 27 mg/dL 17  Creatinine 0.57 - 1.00 mg/dL 0.86  Sodium 134 - 144 mmol/L 142  Potassium 3.5 - 5.2 mmol/L 4.3  Chloride 96 - 106 mmol/L 102  CO2 20 - 29 mmol/L 24    Calcium 8.7 - 10.3 mg/dL 9.2   11/21/2017: Cholesterol 133, triglycerides 88, HDL 47, LDL 88. Creatinine 0.94, EGFR 63/72. 08/20/2017: TSH normal.     Assessment & Recommendations:   1. Essential hypertension Blood pressure is elevated again in our office today. She did not tolerate Olmesartan HCTZ due to hypotension. She is hoping that her blood pressure will improve with continued weight loss. I have discussed options of trying plain Olmesartan; however, she wishes to continue to monitor at home and see if will improve with continued diet modifications and weight loss. Feel that this is a reasonable option. If elevated at her next office visit, will start therapy.  2. Paroxysmal atrial flutter (HCC) CHA2DS2-VASc Score is 2 with yearly risk of stroke of 2.2 %.  HAS-Bled score is 1 and estimated major bleeding in one year is 1-1.5%  Remains stable. No recent palpitations. Tolerating anticoagulation well. Will use electric razor.  3. Eczema, unspecified type Appears to have eczema noted behind the knee. Discussed using non-fragrant lotions and soaps. Advised her to keep skin well moisturized. Encouraged her to try hydrocortisone cream for the next few days. If no improvement, encouraged her to contact her PCP.   Plan: I would like to see her back in 3 months for follow up on hypertension. Encouraged her to contact me sooner if blood pressure continues to be elevated.   Jeri Lager, MSN, APRN, FNP-C [facility], [place] Office: [phone] Fax: [phone]

## 2018-07-13 ENCOUNTER — Other Ambulatory Visit: Payer: Self-pay

## 2018-07-13 ENCOUNTER — Encounter: Payer: Self-pay | Admitting: Cardiology

## 2018-07-13 ENCOUNTER — Ambulatory Visit: Payer: PPO | Admitting: Cardiology

## 2018-07-13 VITALS — BP 142/78 | HR 66 | Ht 64.0 in | Wt 253.0 lb

## 2018-07-13 DIAGNOSIS — L309 Dermatitis, unspecified: Secondary | ICD-10-CM

## 2018-07-13 DIAGNOSIS — I700 Atherosclerosis of aorta: Secondary | ICD-10-CM

## 2018-07-13 DIAGNOSIS — I10 Essential (primary) hypertension: Secondary | ICD-10-CM | POA: Diagnosis not present

## 2018-07-13 DIAGNOSIS — I4892 Unspecified atrial flutter: Secondary | ICD-10-CM | POA: Diagnosis not present

## 2018-07-13 DIAGNOSIS — R002 Palpitations: Secondary | ICD-10-CM

## 2018-07-13 DIAGNOSIS — Z0189 Encounter for other specified special examinations: Secondary | ICD-10-CM | POA: Insufficient documentation

## 2018-07-13 HISTORY — DX: Atherosclerosis of aorta: I70.0

## 2018-07-13 HISTORY — DX: Palpitations: R00.2

## 2018-07-17 ENCOUNTER — Other Ambulatory Visit: Payer: Self-pay

## 2018-07-17 MED ORDER — METOPROLOL TARTRATE 25 MG PO TABS: 25.0000 mg | ORAL_TABLET | Freq: Three times a day (TID) | ORAL | 2 refills | Status: DC

## 2018-07-24 ENCOUNTER — Other Ambulatory Visit: Payer: Self-pay | Admitting: Cardiology

## 2018-08-08 ENCOUNTER — Telehealth: Payer: Self-pay

## 2018-08-08 NOTE — Telephone Encounter (Signed)
Pt wants to know if ok to take Vitamin C and Apple Cider Vinegar. Please advise.//ah

## 2018-08-08 NOTE — Telephone Encounter (Signed)
Pt aware.//ah

## 2018-08-22 ENCOUNTER — Telehealth: Payer: Self-pay

## 2018-08-22 MED ORDER — OLMESARTAN MEDOXOMIL 5 MG PO TABS: 5.0000 mg | ORAL_TABLET | Freq: Every day | ORAL | 1 refills | Status: DC

## 2018-08-22 NOTE — Telephone Encounter (Signed)
Pt seen in office on 3/12. Called to give you an update on her bp readings.  4/9-134/72 4/12-109/61 4/13-144/75 4/15-139/74 4/17-117/62 4/19-144/66 4/21-148/66 C/o slight headache on/off

## 2018-08-22 NOTE — Telephone Encounter (Signed)
Received BP readings from the patient. Discussed with her over the phone. Feel that she should try at least small dose of olmesartan given that her BP continues to be mostly elevated. Kidney function previously remained stable with previous dose of Olmesartan HCTZ; therefore, do not feel needs to be repeated for now. She is also going to see her PCP in May. She will notify me on how she is tolerating the medication in 2 weeks.

## 2018-08-30 DIAGNOSIS — E785 Hyperlipidemia, unspecified: Secondary | ICD-10-CM | POA: Diagnosis not present

## 2018-08-30 DIAGNOSIS — F419 Anxiety disorder, unspecified: Secondary | ICD-10-CM | POA: Diagnosis not present

## 2018-08-30 DIAGNOSIS — Z8679 Personal history of other diseases of the circulatory system: Secondary | ICD-10-CM | POA: Diagnosis not present

## 2018-08-30 DIAGNOSIS — G4733 Obstructive sleep apnea (adult) (pediatric): Secondary | ICD-10-CM | POA: Diagnosis not present

## 2018-08-30 DIAGNOSIS — R531 Weakness: Secondary | ICD-10-CM | POA: Diagnosis not present

## 2018-08-30 DIAGNOSIS — M5416 Radiculopathy, lumbar region: Secondary | ICD-10-CM | POA: Diagnosis not present

## 2018-08-30 DIAGNOSIS — E039 Hypothyroidism, unspecified: Secondary | ICD-10-CM | POA: Diagnosis not present

## 2018-08-30 DIAGNOSIS — M797 Fibromyalgia: Secondary | ICD-10-CM | POA: Diagnosis not present

## 2018-08-30 DIAGNOSIS — R42 Dizziness and giddiness: Secondary | ICD-10-CM | POA: Diagnosis not present

## 2018-09-13 ENCOUNTER — Other Ambulatory Visit: Payer: Self-pay | Admitting: Cardiology

## 2018-09-13 NOTE — Telephone Encounter (Signed)
Please fill

## 2018-10-10 ENCOUNTER — Other Ambulatory Visit: Payer: Self-pay | Admitting: Cardiology

## 2018-10-12 ENCOUNTER — Telehealth: Payer: Self-pay

## 2018-10-12 NOTE — Telephone Encounter (Signed)
Pt called stating that she went out shopping today at [facility] and started feeling bad, dizzy/lightheaded. She went and ate and then went home and checked her bp. It was 118/59, then rechecked about 1 1/2hr later and it was 130/66.   Has pending appt 6/16 and also wants to know if she needs labs prior to appt. Please advise.//ah

## 2018-10-13 NOTE — Telephone Encounter (Signed)
She is up to date on labs. Due to weight loss and prior BP, she will occasionally have dizzy spells. Good hydration is needed

## 2018-10-13 NOTE — Telephone Encounter (Signed)
Pt aware.//ah

## 2018-10-17 ENCOUNTER — Other Ambulatory Visit: Payer: Self-pay

## 2018-10-17 ENCOUNTER — Ambulatory Visit: Payer: PPO | Admitting: Cardiology

## 2018-10-17 ENCOUNTER — Other Ambulatory Visit: Payer: Self-pay | Admitting: Cardiology

## 2018-10-17 ENCOUNTER — Encounter: Payer: Self-pay | Admitting: Cardiology

## 2018-10-17 VITALS — BP 136/71 | HR 65 | Ht 65.0 in | Wt 247.0 lb

## 2018-10-17 DIAGNOSIS — E782 Mixed hyperlipidemia: Secondary | ICD-10-CM

## 2018-10-17 DIAGNOSIS — I4892 Unspecified atrial flutter: Secondary | ICD-10-CM

## 2018-10-17 DIAGNOSIS — I10 Essential (primary) hypertension: Secondary | ICD-10-CM | POA: Diagnosis not present

## 2018-10-17 MED ORDER — ROSUVASTATIN CALCIUM 10 MG PO TABS: ORAL_TABLET | ORAL | 1 refills | Status: DC

## 2018-10-17 NOTE — Progress Notes (Signed)
Primary Physician:  Burnard Bunting, MD   Patient ID: [name], female    DOB: 1950/08/09, 68 y.o.   MRN: [id number]  Subjective:    Chief Complaint  Patient presents with  . Hypertension    3 month f/u    This visit type was conducted due to national recommendations for restrictions regarding the COVID-19 Pandemic (e.g. social distancing).  This format is felt to be most appropriate for this patient at this time.  All issues noted in this document were discussed and addressed.  No physical exam was performed (except for noted visual exam findings with Telehealth visits).  The patient has consented to conduct a Telehealth visit and understands insurance will be billed.   I discussed the limitations of evaluation and management by telemedicine and the availability of in person appointments. The patient expressed understanding and agreed to proceed.  Virtual Visit via Video Note is as below  I connected with Mrs. Davern, on 10/17/18 at 1030 by a video enabled telemedicine application and verified that I am speaking with the correct person using two identifiers.     I have discussed with her regarding the safety during COVID Pandemic and steps and precautions including social distancing with the patient.    HPI: CATLIN DORIA  is a 68 y.o. female  with paroxysmal atypical atrial flutter and labile hypertension.   Patient was recently started on low dose Olmesartan as she called our office with BP readings in the 140's. She had previously been placed on Olmesartan HCTZ; however, had significant hypotension with this. She continues to have occasional dizziness with looking up, but overall stable. Blood pressure has significantly improved with systolic readings in the 203'T at home.   She has chronic back, no chest pain or shortness of breath, tolerating anticoagulation well.  No syncope. She continues to work to lose weight.   Past Medical History:  Diagnosis Date  . Anxiety and  depression   . Aortic arch atherosclerosis ([place]) 07/13/2018  . Exogenous obesity   . Fibromyalgia   . Hyperlipidemia   . Hypothyroidism   . Morbid obesity ([place])   . OSA (obstructive sleep apnea)    uses CPAP  . Palpitations 07/13/2018  . Paroxysmal atrial flutter ([place])   . Periodic limb movement disorder   . REM sleep behavior disorder   . Rhinitis   . SOB (shortness of breath)   . Spinal stenosis   . Unspecified hypothyroidism     Past Surgical History:  Procedure Laterality Date  . CARDIOVERSION N/A 04/29/2015   Procedure: CARDIOVERSION;  Surgeon: Adrian Prows, MD;  Location: [place];  Service: Cardiovascular;  Laterality: N/A;  . CARPAL TUNNEL RELEASE     Bilateral  . DILATION AND CURETTAGE OF UTERUS    . left rotator cuff    . resection uterine polyp  2011    Social History   Socioeconomic History  . Marital status: Married    Spouse name: Not on file  . Number of children: 2  . Years of education: Not on file  . Highest education level: Not on file  Occupational History  . Not on file  Social Needs  . Financial resource strain: Not on file  . Food insecurity    Worry: Not on file    Inability: Not on file  . Transportation needs    Medical: Not on file    Non-medical: Not on file  Tobacco Use  . Smoking status: Never Smoker  .  Smokeless tobacco: Never Used  Substance and Sexual Activity  . Alcohol use: No  . Drug use: No  . Sexual activity: Not on file  Lifestyle  . Physical activity    Days per week: Not on file    Minutes per session: Not on file  . Stress: Not on file  Relationships  . Social Herbalist on phone: Not on file    Gets together: Not on file    Attends religious service: Not on file    Active member of club or organization: Not on file    Attends meetings of clubs or organizations: Not on file    Relationship status: Not on file  . Intimate partner violence    Fear of current or ex partner: Not on file    Emotionally  abused: Not on file    Physically abused: Not on file    Forced sexual activity: Not on file  Other Topics Concern  . Not on file  Social History Narrative  . Not on file    Review of Systems  Constitution: Negative for decreased appetite, malaise/fatigue, weight gain and weight loss.  Eyes: Negative for visual disturbance.  Cardiovascular: Positive for dyspnea on exertion. Negative for chest pain, claudication, leg swelling, orthopnea, palpitations and syncope.  Respiratory: Negative for hemoptysis and wheezing.   Endocrine: Negative for cold intolerance and heat intolerance.  Hematologic/Lymphatic: Does not bruise/bleed easily.  Skin: Negative for itching and nail changes.  Musculoskeletal: Positive for back pain. Negative for muscle weakness and myalgias.  Gastrointestinal: Negative for abdominal pain, change in bowel habit, nausea and vomiting.  Neurological: Positive for dizziness (occasional). Negative for difficulty with concentration, focal weakness and headaches.  Psychiatric/Behavioral: Negative for altered mental status and suicidal ideas.  All other systems reviewed and are negative.     Objective:  Blood pressure 136/71, pulse 65, height '5\' 5"'$  (1.651 m), weight 247 lb (112 kg). Body mass index is 41.1 kg/m.    Physical exam not performed or limited due to virtual visit.  Patient appeared to be in no distress, Neck was supple, respiration was not labored.  Please see exam details from prior visit is as below.   Physical Exam  Constitutional: She appears well-developed. No distress.  Morbidly obese  HENT:  Head: Atraumatic.  Eyes: Conjunctivae are normal.  Neck: Neck supple. No thyromegaly present.  Short neck and difficult to evaluate JVP  Cardiovascular: Normal rate, regular rhythm and normal heart sounds. Exam reveals no gallop.  No murmur heard. Pulses:      Carotid pulses are 2+ on the right side and 2+ on the left side.      Dorsalis pedis pulses are 2+  on the right side and 2+ on the left side.       Posterior tibial pulses are 2+ on the right side and 2+ on the left side.  Femoral and popliteal pulse difficult to feel due to patient's body habitus.   Pulmonary/Chest: Effort normal and breath sounds normal.  Abdominal: Soft. Bowel sounds are normal.  Musculoskeletal: Normal range of motion.        General: No edema.  Neurological: She is alert.  Skin: Skin is warm and dry.  Psychiatric: She has a normal mood and affect.   Radiology: No results found.  Laboratory examination:   11/21/2017: Cholesterol 133, triglycerides 88, HDL 47, LDL 88.  Creatinine 0.94, EGFR 63/72, potassium 4.7, CMP normal.  CBC normal.  CMP Latest Ref Rng &  Units 06/09/2018 04/30/2015  Glucose 65 - 99 mg/dL 79 121(H)  BUN 8 - 27 mg/dL 17 15  Creatinine 0.57 - 1.00 mg/dL 0.86 0.85  Sodium 134 - 144 mmol/L 142 140  Potassium 3.5 - 5.2 mmol/L 4.3 4.3  Chloride 96 - 106 mmol/L 102 109  CO2 20 - 29 mmol/L 24 23  Calcium 8.7 - 10.3 mg/dL 9.2 9.1   CBC Latest Ref Rng & Units 06/09/2018 04/30/2015 03/19/2010  WBC 3.4 - 10.8 x10E3/uL 6.4 7.4 10.0  Hemoglobin 11.1 - 15.9 g/dL 13.1 11.6(L) 13.7  Hematocrit 34.0 - 46.6 % 39.4 36.2 40.8  Platelets 150 - 450 x10E3/uL 214 158 227   Lipid Panel  No results found for: CHOL, TRIG, HDL, CHOLHDL, VLDL, LDLCALC, LDLDIRECT HEMOGLOBIN A1C No results found for: HGBA1C, MPG TSH No results for input(s): TSH in the last 8760 hours.  PRN Meds:. Medications Discontinued During This Encounter  Medication Reason  . ROSUVASTATIN CALCIUM PO Error  . levothyroxine (SYNTHROID, LEVOTHROID) 200 MCG tablet Error  . diltiazem (CARDIZEM CD) 120 MG 24 hr capsule Error  . dabigatran (PRADAXA) 150 MG CAPS capsule Error   Current Meds  Medication Sig  . Apple Cider Vinegar 188 MG CAPS Take by mouth.  . Ascorbic Acid (VITAMIN C) 100 MG tablet Take 100 mg by mouth daily.  . cholecalciferol (VITAMIN D3) 25 MCG (1000 UT) tablet Take 1,000  Units by mouth daily.  Marland Kitchen levothyroxine (SYNTHROID) 175 MCG tablet Take 175 mcg by mouth daily.  . metoprolol tartrate (LOPRESSOR) 25 MG tablet Take 1 tablet (25 mg total) by mouth 3 (three) times daily.  Marland Kitchen olmesartan (BENICAR) 5 MG tablet TAKE 1 TABLET BY MOUTH EVERY DAY  . rosuvastatin (CRESTOR) 10 MG tablet TAKE ONE HALF TABLET BY MOUTH ONCE A DAY.  Marland Kitchen XARELTO 20 MG TABS tablet TAKE 1 (ONE) TABLET TAKE IN THE EVENING AFTER DINNER    Cardiac Studies:   Sleep Study 2006: Used CPAP. Repeat sleep study. 05/28/2015 by Dr. Caryl Comes: No e/o sleep apnea.  Chest x-ray 04/30/2015: Mild CHF, atherosclerotic calcification the aortic arch.  Direct current cardioversion 04/29/2015: A. Flutter to NSR with 75J x 1.  Treadmill Exercise stress 10/11/2014: Indication:Palpitations, Chest pain The patient exercised according to Bruce Protocol, Total time recorded 04:45mn achieving max heart rate of 141 which was 90% of MPHR for age and 5.80 METS of work. Normal BP response. Resting ECG NSR, low voltage complexes. There was no ST-T changes of ischemia with exercise stress test. Stress terminated due to THR (>85% MPHR)/MPHR met and fatigue. Decreased exercise tolerence.  Assessment:     ICD-10-CM   1. Paroxysmal atrial flutter (HCC)  I48.92   2. Essential hypertension  I10   3. Mixed hyperlipidemia  E78.2 Lipid Profile    Comprehensive Metabolic Panel (CMET)    EKG 05/31/2018: Normal sinus rhythm at 60 bpm, normal axis, no evidence of ischemia. Normal EKG.  Recommendations:   Blood pressure has significantly improved with addition of olmesartan.  She has occasional dizziness that is overall stable.  Would recommend continuing the same.  She is also working to lose weight that may also be contributing to this.  I have encouraged her to stay well-hydrated.  I congratulated her on continued weight loss, advised her to continue the same and to start daily exercise such as walking.  She has not had any known  recurrence of atrial flutter.  Continues to be on anticoagulation without any bleeding diathesis.  She has mixed hyperlipidemia  that has been stable by lipid profile test in July 2019, will repeat lipids in the next few weeks along with CMP for surveillance.  I will see her back in 3 months for follow-up on hypertension and paroxysmal atrial flutter.  Miquel Dunn, MSN, APRN, FNP-C [facility]. [place] Office: [phone] Fax: [phone]

## 2018-11-08 ENCOUNTER — Other Ambulatory Visit: Payer: Self-pay | Admitting: Cardiology

## 2018-11-14 ENCOUNTER — Other Ambulatory Visit: Payer: Self-pay | Admitting: Cardiology

## 2018-11-14 DIAGNOSIS — I484 Atypical atrial flutter: Secondary | ICD-10-CM | POA: Diagnosis not present

## 2018-11-14 DIAGNOSIS — E7800 Pure hypercholesterolemia, unspecified: Secondary | ICD-10-CM | POA: Diagnosis not present

## 2018-11-15 LAB — CBC WITH DIFFERENTIAL/PLATELET
Basophils Absolute: 0 10*3/uL (ref 0.0–0.2)
Basos: 1 %
EOS (ABSOLUTE): 0.4 10*3/uL (ref 0.0–0.4)
Eos: 6 %
Hematocrit: 37.3 % (ref 34.0–46.6)
Hemoglobin: 12.5 g/dL (ref 11.1–15.9)
Immature Grans (Abs): 0 10*3/uL (ref 0.0–0.1)
Immature Granulocytes: 0 %
Lymphocytes Absolute: 1.4 10*3/uL (ref 0.7–3.1)
Lymphs: 24 %
MCH: 31 pg (ref 26.6–33.0)
MCHC: 33.5 g/dL (ref 31.5–35.7)
MCV: 93 fL (ref 79–97)
Monocytes Absolute: 0.6 10*3/uL (ref 0.1–0.9)
Monocytes: 10 %
Neutrophils Absolute: 3.5 10*3/uL (ref 1.4–7.0)
Neutrophils: 59 %
Platelets: 178 10*3/uL (ref 150–450)
RBC: 4.03 x10E6/uL (ref 3.77–5.28)
RDW: 12.6 % (ref 11.7–15.4)
WBC: 5.9 10*3/uL (ref 3.4–10.8)

## 2018-11-15 LAB — COMPREHENSIVE METABOLIC PANEL
ALT: 12 IU/L (ref 0–32)
AST: 17 IU/L (ref 0–40)
Albumin/Globulin Ratio: 1.4 (ref 1.2–2.2)
Albumin: 4 g/dL (ref 3.8–4.8)
Alkaline Phosphatase: 47 IU/L (ref 39–117)
BUN/Creatinine Ratio: 19 (ref 12–28)
BUN: 17 mg/dL (ref 8–27)
Bilirubin Total: 0.3 mg/dL (ref 0.0–1.2)
CO2: 23 mmol/L (ref 20–29)
Calcium: 9 mg/dL (ref 8.7–10.3)
Chloride: 106 mmol/L (ref 96–106)
Creatinine, Ser: 0.9 mg/dL (ref 0.57–1.00)
GFR calc Af Amer: 75 mL/min/{1.73_m2} (ref >59)
GFR calc non Af Amer: 65 mL/min/{1.73_m2} (ref >59)
Globulin, Total: 2.9 g/dL (ref 1.5–4.5)
Glucose: 110 mg/dL — ABNORMAL HIGH (ref 65–99)
Potassium: 4.4 mmol/L (ref 3.5–5.2)
Sodium: 143 mmol/L (ref 134–144)
Total Protein: 6.9 g/dL (ref 6.0–8.5)

## 2018-11-15 LAB — LIPID PANEL W/O CHOL/HDL RATIO
Cholesterol, Total: 129 mg/dL (ref 100–199)
HDL: 49 mg/dL (ref >39)
LDL Calculated: 63 mg/dL (ref 0–99)
Triglycerides: 83 mg/dL (ref 0–149)
VLDL Cholesterol Cal: 17 mg/dL (ref 5–40)

## 2018-11-15 LAB — TSH: TSH: 1.82 u[IU]/mL (ref 0.450–4.500)

## 2018-11-17 NOTE — Progress Notes (Signed)
Pt aware.

## 2019-01-11 ENCOUNTER — Other Ambulatory Visit: Payer: Self-pay | Admitting: Cardiology

## 2019-01-23 ENCOUNTER — Other Ambulatory Visit: Payer: Self-pay

## 2019-01-23 ENCOUNTER — Ambulatory Visit (INDEPENDENT_AMBULATORY_CARE_PROVIDER_SITE_OTHER): Payer: PPO | Admitting: Cardiology

## 2019-01-23 ENCOUNTER — Encounter: Payer: Self-pay | Admitting: Cardiology

## 2019-01-23 VITALS — BP 145/70 | HR 68 | Temp 96.4°F | Ht 65.0 in | Wt 260.0 lb

## 2019-01-23 DIAGNOSIS — I4892 Unspecified atrial flutter: Secondary | ICD-10-CM | POA: Diagnosis not present

## 2019-01-23 DIAGNOSIS — I83813 Varicose veins of bilateral lower extremities with pain: Secondary | ICD-10-CM

## 2019-01-23 DIAGNOSIS — I10 Essential (primary) hypertension: Secondary | ICD-10-CM | POA: Diagnosis not present

## 2019-01-23 NOTE — Progress Notes (Signed)
Primary Physician:  Burnard Bunting, MD   Patient ID: [name] [name], female    DOB: April 15, 1951, 68 y.o.   MRN: [id number]  Subjective:    Chief Complaint  Patient presents with  . Hypertension  . PAF  . Follow-up    3 month  . Results    lab      HPI: [name] [name]  is a 68 y.o. female  with paroxysmal atypical atrial flutter and labile hypertension.   Patient is here for hypertension.  She is presently doing well without any significant complaints.  She is tolerating low-dose olmesartan blood pressure has significantly improved.  She does occasionally have episodes of low blood pressure and feels extremely weak on this.  She has chronic back, no chest pain or shortness of breath, tolerating anticoagulation well.  No syncope.  She is very nervous about having spinal injections for her chronic low back pain, and is questioning if she can use CBD cream.  Past Medical History:  Diagnosis Date  . Anxiety and depression   . Aortic arch atherosclerosis ([place]) 07/13/2018  . Exogenous obesity   . Fibromyalgia   . Hyperlipidemia   . Hypothyroidism   . Morbid obesity ([place])   . OSA (obstructive sleep apnea)    uses CPAP  . Palpitations 07/13/2018  . Paroxysmal atrial flutter ([place])   . Periodic limb movement disorder   . REM sleep behavior disorder   . Rhinitis   . SOB (shortness of breath)   . Spinal stenosis   . Unspecified hypothyroidism     Past Surgical History:  Procedure Laterality Date  . CARDIOVERSION N/A 04/29/2015   Procedure: CARDIOVERSION;  Surgeon: Adrian Prows, MD;  Location: [facility];  Service: Cardiovascular;  Laterality: N/A;  . CARPAL TUNNEL RELEASE     Bilateral  . DILATION AND CURETTAGE OF UTERUS    . left rotator cuff    . resection uterine polyp  2011    Social History   Socioeconomic History  . Marital status: Married    Spouse name: Not on file  . Number of children: 2  . Years of education: Not on file  . Highest education level: Not  on file  Occupational History  . Not on file  Social Needs  . Financial resource strain: Not on file  . Food insecurity    Worry: Not on file    Inability: Not on file  . Transportation needs    Medical: Not on file    Non-medical: Not on file  Tobacco Use  . Smoking status: Never Smoker  . Smokeless tobacco: Never Used  Substance and Sexual Activity  . Alcohol use: No  . Drug use: No  . Sexual activity: Not on file  Lifestyle  . Physical activity    Days per week: Not on file    Minutes per session: Not on file  . Stress: Not on file  Relationships  . Social Herbalist on phone: Not on file    Gets together: Not on file    Attends religious service: Not on file    Active member of club or organization: Not on file    Attends meetings of clubs or organizations: Not on file    Relationship status: Not on file  . Intimate partner violence    Fear of current or ex partner: Not on file    Emotionally abused: Not on file    Physically abused: Not on file  Forced sexual activity: Not on file  Other Topics Concern  . Not on file  Social History Narrative  . Not on file    Review of Systems  Constitution: Negative for decreased appetite, malaise/fatigue, weight gain and weight loss.  Eyes: Negative for visual disturbance.  Cardiovascular: Positive for dyspnea on exertion. Negative for chest pain, claudication, leg swelling, orthopnea, palpitations and syncope.  Respiratory: Negative for hemoptysis and wheezing.   Endocrine: Negative for cold intolerance and heat intolerance.  Hematologic/Lymphatic: Does not bruise/bleed easily.  Skin: Negative for itching and nail changes.  Musculoskeletal: Positive for back pain. Negative for muscle weakness and myalgias.  Gastrointestinal: Negative for abdominal pain, change in bowel habit, nausea and vomiting.  Neurological: Positive for dizziness (occasional). Negative for difficulty with concentration, focal weakness and  headaches.  Psychiatric/Behavioral: Negative for altered mental status and suicidal ideas.  All other systems reviewed and are negative.     Objective:  Blood pressure (!) 145/70, pulse 68, temperature (!) 96.4 F (35.8 C), height '5\' 5"'$  (1.651 m), weight 260 lb (117.9 kg), SpO2 98 %. Body mass index is 43.27 kg/m.     Physical Exam  Constitutional: She appears well-developed. No distress.  Morbidly obese  HENT:  Head: Atraumatic.  Eyes: Conjunctivae are normal.  Neck: Neck supple. No thyromegaly present.  Short neck and difficult to evaluate JVP  Cardiovascular: Normal rate, regular rhythm and normal heart sounds. Exam reveals no gallop.  No murmur heard. Pulses:      Carotid pulses are 2+ on the right side and 2+ on the left side.      Dorsalis pedis pulses are 2+ on the right side and 2+ on the left side.       Posterior tibial pulses are 2+ on the right side and 2+ on the left side.  Femoral and popliteal pulse difficult to feel due to patient's body habitus.   Pulmonary/Chest: Effort normal and breath sounds normal.  Abdominal: Soft. Bowel sounds are normal.  Musculoskeletal: Normal range of motion.        General: No edema.  Neurological: She is alert.  Skin: Skin is warm and dry.  Psychiatric: She has a normal mood and affect.   Radiology: No results found.  Laboratory examination:   11/21/2017: Cholesterol 133, triglycerides 88, HDL 47, LDL 88.  Creatinine 0.94, EGFR 63/72, potassium 4.7, CMP normal.  CBC normal.  CMP Latest Ref Rng & Units 11/14/2018 06/09/2018 04/30/2015  Glucose 65 - 99 mg/dL 110(H) 79 121(H)  BUN 8 - 27 mg/dL '17 17 15  '$ Creatinine 0.57 - 1.00 mg/dL 0.90 0.86 0.85  Sodium 134 - 144 mmol/L 143 142 140  Potassium 3.5 - 5.2 mmol/L 4.4 4.3 4.3  Chloride 96 - 106 mmol/L 106 102 109  CO2 20 - 29 mmol/L '23 24 23  '$ Calcium 8.7 - 10.3 mg/dL 9.0 9.2 9.1  Total Protein 6.0 - 8.5 g/dL 6.9 - -  Total Bilirubin 0.0 - 1.2 mg/dL 0.3 - -  Alkaline Phos 39 - 117  IU/L 47 - -  AST 0 - 40 IU/L 17 - -  ALT 0 - 32 IU/L 12 - -   CBC Latest Ref Rng & Units 11/14/2018 06/09/2018 04/30/2015  WBC 3.4 - 10.8 x10E3/uL 5.9 6.4 7.4  Hemoglobin 11.1 - 15.9 g/dL 12.5 13.1 11.6(L)  Hematocrit 34.0 - 46.6 % 37.3 39.4 36.2  Platelets 150 - 450 x10E3/uL 178 214 158   Lipid Panel     Component Value Date/Time  CHOL 129 11/14/2018 0830   TRIG 83 11/14/2018 0830   HDL 49 11/14/2018 0830   LDLCALC 63 11/14/2018 0830   HEMOGLOBIN A1C No results found for: HGBA1C, MPG TSH Recent Labs    11/14/18 0830  TSH 1.820    PRN Meds:. There are no discontinued medications. Current Meds  Medication Sig  . Apple Cider Vinegar 188 MG CAPS Take by mouth.  . Ascorbic Acid (VITAMIN C) 100 MG tablet Take 100 mg by mouth daily.  . cholecalciferol (VITAMIN D3) 25 MCG (1000 UT) tablet Take 1,000 Units by mouth daily.  Marland Kitchen levothyroxine (SYNTHROID) 175 MCG tablet Take 175 mcg by mouth daily.  . metoprolol tartrate (LOPRESSOR) 25 MG tablet TAKE 1 TABLET BY MOUTH THREE TIMES A DAY  . olmesartan (BENICAR) 5 MG tablet TAKE 1 TABLET BY MOUTH EVERY DAY  . rosuvastatin (CRESTOR) 10 MG tablet TAKE ONE HALF TABLET BY MOUTH ONCE A DAY.  Marland Kitchen XARELTO 20 MG TABS tablet TAKE 1 (ONE) TABLET TAKE IN THE EVENING AFTER DINNER    Cardiac Studies:   Sleep Study 2006: Used CPAP. Repeat sleep study. 05/28/2015 by Dr. Caryl Comes: No e/o sleep apnea.  Chest x-ray 04/30/2015: Mild CHF, atherosclerotic calcification the aortic arch.  Direct current cardioversion 04/29/2015: A. Flutter to NSR with 75J x 1.  Treadmill Exercise stress 10/11/2014: Indication:Palpitations, Chest pain The patient exercised according to Bruce Protocol, Total time recorded 04:15mn achieving max heart rate of 141 which was 90% of MPHR for age and 5.80 METS of work. Normal BP response. Resting ECG NSR, low voltage complexes. There was no ST-T changes of ischemia with exercise stress test. Stress terminated due to THR (>85%  MPHR)/MPHR met and fatigue. Decreased exercise tolerence.  Assessment:     ICD-10-CM   1. Essential hypertension  I10 EKG 12-Lead  2. Paroxysmal atrial flutter (HCC)  I48.92 EKG 12-Lead  3. Varicose veins of bilateral lower extremities with pain  I83.813     EKG 01/23/2019: Normal sinus rhythm at 63 bpm, left atrial enlargement, normal axis, T wave inversion in V2. Unchanged from previous EKG 05/31/2018.  Recommendations:   Patient is overall doing well, blood pressure has significantly improved with olmesartan.  She does have occasional episodes of hypotension, in which I have encouraged her to use electrolyte supplements and drinking several glasses of water to help with this.  She will contact me if she has increased frequency of this.  Unfortunately she has recently gained back her previously lost weight.  I have encouraged her to start back on her diet and to work on her weight loss.  She appears motivated.  She has not had known recurrence of atrial flutter.  Continues to be on anticoagulation, will continue the same.  No bleeding diathesis reported.  She does have painful varicose veins on examination, encouraged her to use support stockings to help with progression of her varicosities. Will measure her for this today.  I have encouraged her to talk with her scoliosis specialist regarding alternative therapies for her low back pain, but if needed continue spinal injections.  Unsure of medication interactions/ complications with CBD oil, but will research this. I will see her back in 6 months or sooner if needed.  AMiquel Dunn MSN, APRN, FNP-C [facility]. [place]: [phone]: [phone]

## 2019-02-27 DIAGNOSIS — E7849 Other hyperlipidemia: Secondary | ICD-10-CM | POA: Diagnosis not present

## 2019-02-27 DIAGNOSIS — Z1321 Encounter for screening for nutritional disorder: Secondary | ICD-10-CM | POA: Diagnosis not present

## 2019-02-28 DIAGNOSIS — R82998 Other abnormal findings in urine: Secondary | ICD-10-CM | POA: Diagnosis not present

## 2019-03-05 DIAGNOSIS — E039 Hypothyroidism, unspecified: Secondary | ICD-10-CM | POA: Diagnosis not present

## 2019-03-05 DIAGNOSIS — M5416 Radiculopathy, lumbar region: Secondary | ICD-10-CM | POA: Diagnosis not present

## 2019-03-05 DIAGNOSIS — Z0000 Encounter for general adult medical examination without abnormal findings: Secondary | ICD-10-CM | POA: Diagnosis not present

## 2019-03-05 DIAGNOSIS — Z1339 Encounter for screening examination for other mental health and behavioral disorders: Secondary | ICD-10-CM | POA: Diagnosis not present

## 2019-03-05 DIAGNOSIS — E785 Hyperlipidemia, unspecified: Secondary | ICD-10-CM | POA: Diagnosis not present

## 2019-03-05 DIAGNOSIS — M797 Fibromyalgia: Secondary | ICD-10-CM | POA: Diagnosis not present

## 2019-03-06 DIAGNOSIS — Z1212 Encounter for screening for malignant neoplasm of rectum: Secondary | ICD-10-CM | POA: Diagnosis not present

## 2019-03-08 DIAGNOSIS — Z23 Encounter for immunization: Secondary | ICD-10-CM | POA: Diagnosis not present

## 2019-03-12 ENCOUNTER — Other Ambulatory Visit: Payer: Self-pay | Admitting: Cardiology

## 2019-04-06 ENCOUNTER — Other Ambulatory Visit: Payer: Self-pay | Admitting: Cardiology

## 2019-05-09 ENCOUNTER — Other Ambulatory Visit: Payer: Self-pay | Admitting: Cardiology

## 2019-05-30 ENCOUNTER — Telehealth: Payer: Self-pay

## 2019-05-30 NOTE — Telephone Encounter (Signed)
Pt called in wanting your advice as to what she can take for her spinal stenosis that wont interfere with her cardiac meds. It is getting unbarable at times especially when walking up steps. Worse in legs. She does have bio-freeze and CBD with minimal relief. She was also advised of a non steroidal shot but wants to know if that would be safe for her to take. Please advise.//ah

## 2019-05-30 NOTE — Telephone Encounter (Signed)
She was seen by AK, but I will answer. NSAID will increase risk of bleeding.  Hence to use minimal amount as needed if no options available.  Consider weight loss, water aerobics and recommendations of the ortho. JG

## 2019-06-01 NOTE — Telephone Encounter (Signed)
Called pt to inform her about the message below. Pt understood

## 2019-06-13 DIAGNOSIS — M4697 Unspecified inflammatory spondylopathy, lumbosacral region: Secondary | ICD-10-CM | POA: Diagnosis not present

## 2019-06-13 DIAGNOSIS — I480 Paroxysmal atrial fibrillation: Secondary | ICD-10-CM | POA: Diagnosis not present

## 2019-06-27 DIAGNOSIS — Q762 Congenital spondylolisthesis: Secondary | ICD-10-CM | POA: Diagnosis not present

## 2019-06-27 DIAGNOSIS — M47816 Spondylosis without myelopathy or radiculopathy, lumbar region: Secondary | ICD-10-CM | POA: Diagnosis not present

## 2019-06-29 ENCOUNTER — Ambulatory Visit: Payer: PPO

## 2019-07-02 DIAGNOSIS — H524 Presbyopia: Secondary | ICD-10-CM | POA: Diagnosis not present

## 2019-07-02 DIAGNOSIS — H52223 Regular astigmatism, bilateral: Secondary | ICD-10-CM | POA: Diagnosis not present

## 2019-07-02 DIAGNOSIS — H25093 Other age-related incipient cataract, bilateral: Secondary | ICD-10-CM | POA: Diagnosis not present

## 2019-07-02 DIAGNOSIS — H5203 Hypermetropia, bilateral: Secondary | ICD-10-CM | POA: Diagnosis not present

## 2019-07-05 ENCOUNTER — Ambulatory Visit: Payer: PPO | Attending: Orthopaedic Surgery | Admitting: Physical Therapy

## 2019-07-05 ENCOUNTER — Other Ambulatory Visit: Payer: Self-pay

## 2019-07-05 ENCOUNTER — Encounter: Payer: Self-pay | Admitting: Physical Therapy

## 2019-07-05 DIAGNOSIS — R293 Abnormal posture: Secondary | ICD-10-CM | POA: Insufficient documentation

## 2019-07-05 DIAGNOSIS — G8929 Other chronic pain: Secondary | ICD-10-CM | POA: Diagnosis not present

## 2019-07-05 DIAGNOSIS — M6281 Muscle weakness (generalized): Secondary | ICD-10-CM | POA: Diagnosis not present

## 2019-07-05 DIAGNOSIS — M545 Low back pain, unspecified: Secondary | ICD-10-CM

## 2019-07-05 DIAGNOSIS — R2689 Other abnormalities of gait and mobility: Secondary | ICD-10-CM | POA: Diagnosis not present

## 2019-07-05 NOTE — Therapy (Signed)
[facility] [address] [place], [place], [address] Phone: [phone]   Fax:  [phone]  Physical Therapy Evaluation  Patient Details  Name: [name] [name] MRN: [id number] Date of Birth: 11-23-1950 Referring Provider (PT): Sharolyn Douglas MD   Encounter Date: 07/05/2019  PT End of Session - 07/05/19 1037    Visit Number  1    Number of Visits  13    Date for PT Re-Evaluation  08/16/19    Authorization Type  MCR: Kx mod at 15th visit    Progress Note Due on Visit  10    PT Start Time  1045    PT Stop Time  1135    PT Time Calculation (min)  50 min    Activity Tolerance  Patient tolerated treatment well    Behavior During Therapy  [facility] for tasks assessed/performed       Past Medical History:  Diagnosis Date  . Anxiety and depression   . Aortic arch atherosclerosis (HCC) 07/13/2018  . Exogenous obesity   . Fibromyalgia   . Hyperlipidemia   . Hypothyroidism   . Morbid obesity (HCC)   . OSA (obstructive sleep apnea)    uses CPAP  . Palpitations 07/13/2018  . Paroxysmal atrial flutter (HCC)   . Periodic limb movement disorder   . REM sleep behavior disorder   . Rhinitis   . SOB (shortness of breath)   . Spinal stenosis   . Unspecified hypothyroidism     Past Surgical History:  Procedure Laterality Date  . CARDIOVERSION N/A 04/29/2015   Procedure: CARDIOVERSION;  Surgeon: Yates Decamp, MD;  Location: [facility] ENDOSCOPY;  Service: Cardiovascular;  Laterality: N/A;  . CARPAL TUNNEL RELEASE     Bilateral  . DILATION AND CURETTAGE OF UTERUS    . left rotator cuff    . resection uterine polyp  2011    There were no vitals filed for this visit.   Subjective Assessment - 07/05/19 1048    Subjective  pt is a 69 y.o F With CC of acute on chronic low back pain with the recent exacerbation starting in November of 2020 with no specific MOI. She reports pain stays inthe low back with with referred symptoms down bil LE but reports it goes  mostly into the LLE into the shin. She denies red flags. since recent exacerbationg the pain contineus to worsening.    Limitations  Standing;Walking    How long can you sit comfortably?  30 min    How long can you stand comfortably?  30 min    How long can you walk comfortably?  30 min    Diagnostic tests  nothing recently    Patient Stated Goals  to decrease pain and know how contro pain    Currently in Pain?  Yes    Pain Score  0-No pain   at worst 9/10   Pain Location  Back    Pain Orientation  Right;Left    Pain Type  Chronic pain    Pain Radiating Towards  LLE referred pain down to the L shin    Pain Onset  More than a month ago    Pain Frequency  Constant    Aggravating Factors   going up steps, prolonged standing or sitting.    Pain Relieving Factors  resting,         [facility] PT Assessment - 07/05/19 1058      Assessment   Medical Diagnosis  Congenital spondylolisthesis  X32.4    Referring Provider (PT)  Sharolyn Douglas MD    Onset Date/Surgical Date  --   November 2020   Hand Dominance  Right    Next MD Visit  08/08/2019    Prior Therapy  yes   for low back     Precautions   Precautions  None      Restrictions   Weight Bearing Restrictions  No      Balance Screen   Has the patient fallen in the past 6 months  No      Home Environment   Living Environment  Private residence    Living Arrangements  Spouse/significant other    Available Help at Discharge  Family    Type of Home  House    Home Equipment  None      Prior Function   Level of Independence  Independent with basic ADLs      Cognition   Overall Cognitive Status  Within Functional Limits for tasks assessed      Posture/Postural Control   Posture/Postural Control  Postural limitations    Postural Limitations  Rounded Shoulders;Forward head      ROM / Strength   AROM / PROM / Strength  Strength;AROM      AROM   AROM Assessment Site  Lumbar    Lumbar Flexion  50   end range pain    Lumbar Extension   20   pain during movement   Lumbar - Right Side Bend  18    Lumbar - Left Side Bend  18    Lumbar - Right Rotation  50% limited    Lumbar - Left Rotation  50% limited      Strength   Strength Assessment Site  Hip    Right/Left Hip  Right;Left    Right Hip Flexion  4-/5    Right Hip Extension  4-/5    Right Hip ABduction  4-/5    Left Hip Flexion  4-/5    Left Hip Extension  4-/5    Left Hip ABduction  4-/5      Palpation   Palpation comment  TTP along the glute med/ max and piriformis. bil lumbar paraspinals spasm L>R      Special Tests    Special Tests  Lumbar    Lumbar Tests  Straight Leg Raise      Straight Leg Raise   Findings  Negative    Side   Left      Ambulation/Gait   Ambulation/Gait  Yes    Gait Pattern  Step-through pattern;Decreased stride length;Antalgic;Trendelenburg                Objective measurements completed on examination: See above findings.      [facility] Adult PT Treatment/Exercise - 07/05/19 1058      Exercises   Exercises  Lumbar      Lumbar Exercises: Stretches   Single Knee to Chest Stretch  2 reps;30 seconds;Left    Lower Trunk Rotation Limitations  2 x 10     Piriformis Stretch  2 reps;Left;30 seconds      Manual Therapy   Manual Therapy  Soft tissue mobilization;Joint mobilization    Manual therapy comments  skilled palpation and monitoring of pt throughout TPDN    Joint Mobilization  LAD grade III LLE only    Soft tissue mobilization  IASTM along the L glute med/ min / max and piriformis       Trigger Point  Dry Needling - 07/05/19 0001    Consent Given?  Yes    Education Handout Provided  Yes    Muscles Treated Back/Hip  Piriformis;Gluteus maximus;Gluteus medius    Gluteus Medius Response  Twitch response elicited;Palpable increased muscle length   L   Gluteus Maximus Response  Twitch response elicited;Palpable increased muscle length   L   Piriformis Response  Twitch response elicited;Palpable increased muscle  length   L only            PT Short Term Goals - 07/05/19 1241      PT SHORT TERM GOAL #1   Title  pt to be I with inital HEp     Period  Weeks    Status  New    Target Date  07/26/19      PT SHORT TERM GOAL #2   Title  pt to demo/ verbalize proper posture during sitting/ standing and lifting and utliizes breaks appropriately to prevent/ reduce low back pain/ tightness    Period  Weeks    Status  New    Target Date  07/26/19        PT Long Term Goals - 07/05/19 1241      PT LONG TERM GOAL #1   Title  increase trunk flexion to >/= 75 degrees for functional mobility required for ADLS    Time  6    Period  Weeks    Status  New    Target Date  08/16/19      PT LONG TERM GOAL #2   Title  pt to be able to stand and walk >/= 1 hour with no report of pain and navigate up/dwon >/= 12 steps with no report of pain for functional mobility    Time  6    Period  Weeks    Status  New    Target Date  08/16/19      PT LONG TERM GOAL #3   Title  pt to increase FOTO score to </= 41% limited to demo improvement in fucntion    Time  6    Period  Weeks    Status  New    Target Date  08/16/19      PT LONG TERM GOAL #4   Title  pt to return to personal exercise with no limtations per pt's personal goal to help reduce low back pain flares    Time  6    Period  Weeks    Status  New    Target Date  08/16/19      PT LONG TERM GOAL #5   Title  pt to be I with all HEp given as of last visit to maintain and progress current level of function    Time  6    Period  Weeks    Status  New    Target Date  08/16/19             Plan - 07/05/19 1205    Clinical Impression Statement  pt presents to OPPT with CC of acute on chronic low back pain with LE referral into the L shin. She exhibits limited trunk mobiltiy in all planes secondary to stiffness/ pain in the back.    Personal Factors and Comorbidities  Comorbidity 3+;Age    Comorbidities  hx of anxiety, depression, chronic low  back pain    Examination-Activity Limitations  Locomotion Level;Stand;Stairs    Stability/Clinical Decision Making  Evolving/Moderate complexity    Clinical  Decision Making  Moderate    Rehab Potential  Good    PT Frequency  2x / week    PT Duration  6 weeks    PT Treatment/Interventions  ADLs/Self Care Home Management;Cryotherapy;Electrical Stimulation;Moist Heat;Traction;Ultrasound;Gait training;Therapeutic exercise;Balance training;Neuromuscular re-education;Manual techniques;Patient/family education;Passive range of motion;Dry needling;Taping;Therapeutic activities;Iontophoresis 4mg /ml Dexamethasone;Stair training    PT Next Visit Plan  review/ update HEP PRN, response to TPDN for piriformis, glute max/med and L paraspinals. soft tissue mobilization, flexion based treatment    PT Home Exercise Plan  [id number] - piriformis stretch, single knee to chest, lower trunk rotation, clamshell, supine marching    Consulted and Agree with Plan of Care  Patient       Patient will benefit from skilled therapeutic intervention in order to improve the following deficits and impairments:  Abnormal gait, Postural dysfunction, Increased muscle spasms, Decreased strength, Improper body mechanics, Pain, Decreased activity tolerance, Decreased endurance, Decreased balance, Decreased safety awareness, Decreased range of motion, Obesity  Visit Diagnosis: Chronic bilateral low back pain, unspecified whether sciatica present  Muscle weakness (generalized)  Abnormal posture  Other abnormalities of gait and mobility     Problem List Patient Active Problem List   Diagnosis Date Noted  . Palpitations 07/13/2018  . Laboratory examination 07/13/2018  . Aortic arch atherosclerosis (HCC) 07/13/2018  . Sleep talking 06/04/2015  . SOB (shortness of breath)   . Morbid obesity (HCC)   . Fibromyalgia   . Hyperlipidemia   . Hypothyroidism   . Paroxysmal atrial flutter (HCC)   . ANXIETY DEPRESSION 04/15/2007  .  REM SLEEP BEHAVIOR DISORDER 04/15/2007  . UNSPECIFIED HYPOTHYROIDISM 04/14/2007  . EXOGENOUS OBESITY 02/10/2007  . OBSTRUCTIVE SLEEP APNEA 02/10/2007  . PERIODIC LIMB MOVEMENT DISORDER 02/10/2007  . RHINITIS 02/10/2007    04/12/2007 PT, DPT, LAT, ATC  07/05/19  12:47 PM      [facility] [address] [place], [place], [place] Phone: [phone]   Fax:  [phone]  Name: [name] [name] MRN: [name] Date of Birth: April 30, 1951

## 2019-07-06 ENCOUNTER — Other Ambulatory Visit: Payer: Self-pay | Admitting: Cardiology

## 2019-07-10 ENCOUNTER — Other Ambulatory Visit: Payer: Self-pay | Admitting: Cardiology

## 2019-07-16 DIAGNOSIS — N958 Other specified menopausal and perimenopausal disorders: Secondary | ICD-10-CM | POA: Diagnosis not present

## 2019-07-16 DIAGNOSIS — Z1231 Encounter for screening mammogram for malignant neoplasm of breast: Secondary | ICD-10-CM | POA: Diagnosis not present

## 2019-07-16 DIAGNOSIS — Z01419 Encounter for gynecological examination (general) (routine) without abnormal findings: Secondary | ICD-10-CM | POA: Diagnosis not present

## 2019-07-16 DIAGNOSIS — Z6841 Body Mass Index (BMI) 40.0 and over, adult: Secondary | ICD-10-CM | POA: Diagnosis not present

## 2019-07-18 ENCOUNTER — Ambulatory Visit: Payer: PPO | Admitting: Physical Therapy

## 2019-07-20 ENCOUNTER — Ambulatory Visit: Payer: PPO | Admitting: Physical Therapy

## 2019-07-20 ENCOUNTER — Other Ambulatory Visit: Payer: Self-pay

## 2019-07-20 DIAGNOSIS — R293 Abnormal posture: Secondary | ICD-10-CM

## 2019-07-20 DIAGNOSIS — M545 Low back pain: Secondary | ICD-10-CM | POA: Diagnosis not present

## 2019-07-20 DIAGNOSIS — M6281 Muscle weakness (generalized): Secondary | ICD-10-CM

## 2019-07-20 DIAGNOSIS — R2689 Other abnormalities of gait and mobility: Secondary | ICD-10-CM

## 2019-07-20 DIAGNOSIS — G8929 Other chronic pain: Secondary | ICD-10-CM

## 2019-07-20 NOTE — Therapy (Signed)
[facility] [address] [place], [place], [address] Phone: [phone]   Fax:  [phone]  Physical Therapy Treatment  Patient Details  Name: [name] [name] MRN: [id number] Date of Birth: 09-Oct-1950 Referring Provider (PT): Sharolyn Douglas MD   Encounter Date: 07/20/2019  PT End of Session - 07/20/19 1002    Visit Number  2    Number of Visits  13    Date for PT Re-Evaluation  08/16/19    Authorization Type  MCR: Kx mod at 15th visit    PT Start Time  1000    PT Stop Time  1043    PT Time Calculation (min)  43 min    Activity Tolerance  Patient tolerated treatment well    Behavior During Therapy  [facility] for tasks assessed/performed       Past Medical History:  Diagnosis Date  . Anxiety and depression   . Aortic arch atherosclerosis (HCC) 07/13/2018  . Exogenous obesity   . Fibromyalgia   . Hyperlipidemia   . Hypothyroidism   . Morbid obesity (HCC)   . OSA (obstructive sleep apnea)    uses CPAP  . Palpitations 07/13/2018  . Paroxysmal atrial flutter (HCC)   . Periodic limb movement disorder   . REM sleep behavior disorder   . Rhinitis   . SOB (shortness of breath)   . Spinal stenosis   . Unspecified hypothyroidism     Past Surgical History:  Procedure Laterality Date  . CARDIOVERSION N/A 04/29/2015   Procedure: CARDIOVERSION;  Surgeon: Yates Decamp, MD;  Location: [facility] ENDOSCOPY;  Service: Cardiovascular;  Laterality: N/A;  . CARPAL TUNNEL RELEASE     Bilateral  . DILATION AND CURETTAGE OF UTERUS    . left rotator cuff    . resection uterine polyp  2011    There were no vitals filed for this visit.  Subjective Assessment - 07/20/19 1003    Subjective  " the DN helped that day but I did get some soreness and tenderness which last 2-3 days after. I do get some days that are good and bad"    Patient Stated Goals  to decrease pain and know how control pain    Currently in Pain?  Yes    Pain Score  4     Pain Location   Back    Pain Orientation  Right    Pain Descriptors / Indicators  Aching    Pain Type  Chronic pain    Aggravating Factors   stairs, prolonged    Pain Relieving Factors  restingg         [facility] PT Assessment - 07/20/19 0001      Assessment   Medical Diagnosis  Congenital spondylolisthesis Q76.2    Referring Provider (PT)  Sharolyn Douglas MD                   [facility] Adult PT Treatment/Exercise - 07/20/19 0001      Lumbar Exercises: Standing   Other Standing Lumbar Exercises  stair training at end of the session she reported 1/10 pain      Lumbar Exercises: Sidelying   Clam  Left;10 reps    Hip Abduction  Left;10 reps   x 2 sets     Manual Therapy   Manual therapy comments  skilled palpation and monitoring of pt throughout TPDN    Joint Mobilization  LAD grade III and IV LLE only, posterior hip mobs grade III LLE only  Soft tissue mobilization  IASTM along the L glute med/ min / max and piriformis       Trigger Point Dry Needling - 07/20/19 0001    Consent Given?  Yes    Education Handout Provided  Previously provided    Gluteus Medius Response  Twitch response elicited;Palpable increased muscle length             PT Short Term Goals - 07/05/19 1241      PT SHORT TERM GOAL #1   Title  pt to be I with inital HEp     Period  Weeks    Status  New    Target Date  07/26/19      PT SHORT TERM GOAL #2   Title  pt to demo/ verbalize proper posture during sitting/ standing and lifting and utliizes breaks appropriately to prevent/ reduce low back pain/ tightness    Period  Weeks    Status  New    Target Date  07/26/19        PT Long Term Goals - 07/05/19 1241      PT LONG TERM GOAL #1   Title  increase trunk flexion to >/= 75 degrees for functional mobility required for ADLS    Time  6    Period  Weeks    Status  New    Target Date  08/16/19      PT LONG TERM GOAL #2   Title  pt to be able to stand and walk >/= 1 hour with no report of pain and  navigate up/dwon >/= 12 steps with no report of pain for functional mobility    Time  6    Period  Weeks    Status  New    Target Date  08/16/19      PT LONG TERM GOAL #3   Title  pt to increase FOTO score to </= 41% limited to demo improvement in fucntion    Time  6    Period  Weeks    Status  New    Target Date  08/16/19      PT LONG TERM GOAL #4   Title  pt to return to personal exercise with no limtations per pt's personal goal to help reduce low back pain flares    Time  6    Period  Weeks    Status  New    Target Date  08/16/19      PT LONG TERM GOAL #5   Title  pt to be I with all HEp given as of last visit to maintain and progress current level of function    Time  6    Period  Weeks    Status  New    Target Date  08/16/19            Plan - 07/20/19 1136    Clinical Impression Statement  pt reported some relief following last session but noted the pain returned in the L hip / leg. Continued TPDN focusing on the L glute med / min followed with IASTM and hip mobs. She demostrates positive scour testing and noted relief of pain with distraction, suggesting highlihood of hip involvement vs back. pt was able to navigate steps following treatment noting 1/10 pain compared to 5-6/10 when tested at beginning of session.    PT Treatment/Interventions  ADLs/Self Care Home Management;Cryotherapy;Electrical Stimulation;Moist Heat;Traction;Ultrasound;Gait training;Therapeutic exercise;Balance training;Neuromuscular re-education;Manual techniques;Patient/family education;Passive range of motion;Dry needling;Taping;Therapeutic activities;Iontophoresis 4mg /ml Dexamethasone;Stair training  PT Next Visit Plan  review/ update HEP PRN, response to TPDN for piriformis, glute max/med and L paraspinals. soft tissue mobilization, flexion based treatment    PT Home Exercise Plan  [id number] - piriformis stretch, single knee to chest, lower trunk rotation, clamshell, supine marching        Patient will benefit from skilled therapeutic intervention in order to improve the following deficits and impairments:  Abnormal gait, Postural dysfunction, Increased muscle spasms, Decreased strength, Improper body mechanics, Pain, Decreased activity tolerance, Decreased endurance, Decreased balance, Decreased safety awareness, Decreased range of motion, Obesity  Visit Diagnosis: Chronic bilateral low back pain, unspecified whether sciatica present  Muscle weakness (generalized)  Abnormal posture  Other abnormalities of gait and mobility     Problem List Patient Active Problem List   Diagnosis Date Noted  . Palpitations 07/13/2018  . Laboratory examination 07/13/2018  . Aortic arch atherosclerosis ([place]) 07/13/2018  . Sleep talking 06/04/2015  . SOB (shortness of breath)   . Morbid obesity ([place])   . Fibromyalgia   . Hyperlipidemia   . Hypothyroidism   . Paroxysmal atrial flutter ([place])   . ANXIETY DEPRESSION 04/15/2007  . REM SLEEP BEHAVIOR DISORDER 04/15/2007  . UNSPECIFIED HYPOTHYROIDISM 04/14/2007  . EXOGENOUS OBESITY 02/10/2007  . OBSTRUCTIVE SLEEP APNEA 02/10/2007  . PERIODIC LIMB MOVEMENT DISORDER 02/10/2007  . RHINITIS 02/10/2007   Starr Lake PT, DPT, LAT, ATC  07/20/19  11:44 AM      [facility] [address] [place], [place], [address] Phone: [phone]   Fax:  [phone]  Name: [name] [name] MRN: [id number] Date of Birth: 03-07-1951

## 2019-07-23 ENCOUNTER — Ambulatory Visit: Payer: PPO | Admitting: Physical Therapy

## 2019-07-23 ENCOUNTER — Other Ambulatory Visit: Payer: Self-pay

## 2019-07-23 ENCOUNTER — Encounter: Payer: Self-pay | Admitting: Physical Therapy

## 2019-07-23 DIAGNOSIS — M6281 Muscle weakness (generalized): Secondary | ICD-10-CM

## 2019-07-23 DIAGNOSIS — R2689 Other abnormalities of gait and mobility: Secondary | ICD-10-CM

## 2019-07-23 DIAGNOSIS — R293 Abnormal posture: Secondary | ICD-10-CM

## 2019-07-23 DIAGNOSIS — M545 Low back pain: Secondary | ICD-10-CM | POA: Diagnosis not present

## 2019-07-23 DIAGNOSIS — G8929 Other chronic pain: Secondary | ICD-10-CM

## 2019-07-23 NOTE — Therapy (Signed)
[place], [place], [address] Phone: [phone]   Fax:  [phone]  Physical Therapy Treatment  Patient Details  Name: [name] [name] MRN: [id number] Date of Birth: 04-10-1951 Referring Provider (PT): Rennis Harding MD   Encounter Date: 07/23/2019  PT End of Session - 07/23/19 1054    Visit Number  3    Number of Visits  13    Date for PT Re-Evaluation  08/16/19    Authorization Type  MCR: Kx mod at 15th visit    PT Start Time  1058    PT Stop Time  1148    PT Time Calculation (min)  50 min    Activity Tolerance  Patient tolerated treatment well    Behavior During Therapy  [facility] for tasks assessed/performed       Past Medical History:  Diagnosis Date  . Anxiety and depression   . Aortic arch atherosclerosis ([place]) 07/13/2018  . Exogenous obesity   . Fibromyalgia   . Hyperlipidemia   . Hypothyroidism   . Morbid obesity ([place])   . OSA (obstructive sleep apnea)    uses CPAP  . Palpitations 07/13/2018  . Paroxysmal atrial flutter ([place])   . Periodic limb movement disorder   . REM sleep behavior disorder   . Rhinitis   . SOB (shortness of breath)   . Spinal stenosis   . Unspecified hypothyroidism     Past Surgical History:  Procedure Laterality Date  . CARDIOVERSION N/A 04/29/2015   Procedure: CARDIOVERSION;  Surgeon: Adrian Prows, MD;  Location: [place];  Service: Cardiovascular;  Laterality: N/A;  . CARPAL TUNNEL RELEASE     Bilateral  . DILATION AND CURETTAGE OF UTERUS    . left rotator cuff    . resection uterine polyp  2011    There were no vitals filed for this visit.  Subjective Assessment - 07/23/19 1055    Subjective  "I felt much better after the last session. it did come back alittle better since the last session"    Patient Stated Goals  to decrease pain and know how control pain    Currently in Pain?  Yes    Pain Score  4     Pain Location  Hip    Pain Orientation  Right     Pain Descriptors / Indicators  Aching;Sore    Pain Type  Chronic pain    Pain Onset  More than a month ago    Pain Frequency  Intermittent                       [facility] Adult PT Treatment/Exercise - 07/23/19 0001      Lumbar Exercises: Stretches   Lower Trunk Rotation Limitations  2 x 10     Other Lumbar Stretch Exercise  L glute med stretch 2 x 30       Lumbar Exercises: Aerobic   Nustep  L5 x 5 min LE only    MET staying between 2.0-3.0     Knee/Hip Exercises: Standing   Hip Abduction  2 sets;10 reps;Knee straight    Hip Extension  2 sets;10 reps    Forward Step Up  Step Height: 6";Left;2 sets;10 reps      Modalities   Modalities  Ultrasound      Ultrasound   Ultrasound Location  L glute med    Ultrasound Parameters  1.0 mhz  8 min x 1.4 w/cm2  Ultrasound Goals  Pain      Manual Therapy   Manual therapy comments  skilled palpation and monitoring of pt throughout TPDN    Joint Mobilization  LAD grade III and IV LLE only, posterior hip mobs grade III LLE only    Soft tissue mobilization  IASTM along the L glute med/ min / max and piriformis       Trigger Point Dry Needling - 07/23/19 0001    Consent Given?  Yes    Education Handout Provided  Previously provided    Muscles Treated Back/Hip  Gluteus minimus    Gluteus Minimus Response  Twitch response elicited;Palpable increased muscle length   L only   Gluteus Medius Response  Twitch response elicited;Palpable increased muscle length             PT Short Term Goals - 07/05/19 1241      PT SHORT TERM GOAL #1   Title  pt to be I with inital HEp     Period  Weeks    Status  New    Target Date  07/26/19      PT SHORT TERM GOAL #2   Title  pt to demo/ verbalize proper posture during sitting/ standing and lifting and utliizes breaks appropriately to prevent/ reduce low back pain/ tightness    Period  Weeks    Status  New    Target Date  07/26/19        PT Long Term Goals - 07/05/19 1241       PT LONG TERM GOAL #1   Title  increase trunk flexion to >/= 75 degrees for functional mobility required for ADLS    Time  6    Period  Weeks    Status  New    Target Date  08/16/19      PT LONG TERM GOAL #2   Title  pt to be able to stand and walk >/= 1 hour with no report of pain and navigate up/dwon >/= 12 steps with no report of pain for functional mobility    Time  6    Period  Weeks    Status  New    Target Date  08/16/19      PT LONG TERM GOAL #3   Title  pt to increase FOTO score to </= 41% limited to demo improvement in fucntion    Time  6    Period  Weeks    Status  New    Target Date  08/16/19      PT LONG TERM GOAL #4   Title  pt to return to personal exercise with no limtations per pt's personal goal to help reduce low back pain flares    Time  6    Period  Weeks    Status  New    Target Date  08/16/19      PT LONG TERM GOAL #5   Title  pt to be I with all HEp given as of last visit to maintain and progress current level of function    Time  6    Period  Weeks    Status  New    Target Date  08/16/19            Plan - 07/23/19 1135    Clinical Impression Statement  pt continues to report improvement of pain following session and that the pain returns 1-2 days following with reduced severity. continued TPDN focusing on L glute  med/ min followed with [place] and IASTM Techniques. continued working hip strengthening focusing on abductor snad extensors and step up/ down activities to working on form, and cues to avoid focusing on pain and focus on function.    PT Treatment/Interventions  ADLs/Self Care Home Management;Cryotherapy;Electrical Stimulation;Moist Heat;Traction;Ultrasound;Gait training;Therapeutic exercise;Balance training;Neuromuscular re-education;Manual techniques;Patient/family education;Passive range of motion;Dry needling;Taping;Therapeutic activities;Iontophoresis '4mg'$ /ml Dexamethasone;Stair training    PT Next Visit Plan  review/ update HEP PRN,  response to TPDN for piriformis, glute max/med and L paraspinals. soft tissue mobilization, extension biased    PT Home Exercise Plan  [id number] - piriformis stretch, single knee to chest, lower trunk rotation, clamshell, supine marching    Consulted and Agree with Plan of Care  Patient       Patient will benefit from skilled therapeutic intervention in order to improve the following deficits and impairments:  Abnormal gait, Postural dysfunction, Increased muscle spasms, Decreased strength, Improper body mechanics, Pain, Decreased activity tolerance, Decreased endurance, Decreased balance, Decreased safety awareness, Decreased range of motion, Obesity  Visit Diagnosis: Chronic bilateral low back pain, unspecified whether sciatica present  Muscle weakness (generalized)  Abnormal posture  Other abnormalities of gait and mobility     Problem List Patient Active Problem List   Diagnosis Date Noted  . Palpitations 07/13/2018  . Laboratory examination 07/13/2018  . Aortic arch atherosclerosis ([place]) 07/13/2018  . Sleep talking 06/04/2015  . SOB (shortness of breath)   . Morbid obesity ([place])   . Fibromyalgia   . Hyperlipidemia   . Hypothyroidism   . Paroxysmal atrial flutter ([place])   . ANXIETY DEPRESSION 04/15/2007  . REM SLEEP BEHAVIOR DISORDER 04/15/2007  . UNSPECIFIED HYPOTHYROIDISM 04/14/2007  . EXOGENOUS OBESITY 02/10/2007  . OBSTRUCTIVE SLEEP APNEA 02/10/2007  . PERIODIC LIMB MOVEMENT DISORDER 02/10/2007  . RHINITIS 02/10/2007   Starr Lake PT, DPT, LAT, ATC  07/23/19  12:12 PM      [facility] [address] [place], [place], [address] Phone: [phone]   Fax:  [phone]  Name: [name] [name] MRN: [id number] Date of Birth: 09-16-1950

## 2019-07-25 ENCOUNTER — Ambulatory Visit: Payer: PPO | Admitting: Physical Therapy

## 2019-07-25 ENCOUNTER — Ambulatory Visit: Payer: PPO | Admitting: Cardiology

## 2019-07-25 ENCOUNTER — Other Ambulatory Visit: Payer: Self-pay

## 2019-07-25 DIAGNOSIS — M545 Low back pain: Secondary | ICD-10-CM | POA: Diagnosis not present

## 2019-07-25 DIAGNOSIS — G8929 Other chronic pain: Secondary | ICD-10-CM

## 2019-07-25 DIAGNOSIS — M6281 Muscle weakness (generalized): Secondary | ICD-10-CM

## 2019-07-25 DIAGNOSIS — R293 Abnormal posture: Secondary | ICD-10-CM

## 2019-07-25 DIAGNOSIS — R2689 Other abnormalities of gait and mobility: Secondary | ICD-10-CM

## 2019-07-25 NOTE — Therapy (Signed)
[place], [place], [address] Phone: [phone]   Fax:  [phone]  Physical Therapy Treatment  Patient Details  Name: [name] [name] MRN: [id number] Date of Birth: 03/02/1951 Referring Provider (PT): Rennis Harding MD   Encounter Date: 07/25/2019  PT End of Session - 07/25/19 1103    Visit Number  4    Number of Visits  13    Date for PT Re-Evaluation  08/16/19    Authorization Type  MCR: Kx mod at 15th visit    PT Start Time  1102    PT Stop Time  1143    PT Time Calculation (min)  41 min    Activity Tolerance  Patient tolerated treatment well    Behavior During Therapy  [facility] for tasks assessed/performed       Past Medical History:  Diagnosis Date  . Anxiety and depression   . Aortic arch atherosclerosis ([place]) 07/13/2018  . Exogenous obesity   . Fibromyalgia   . Hyperlipidemia   . Hypothyroidism   . Morbid obesity ([place])   . OSA (obstructive sleep apnea)    uses CPAP  . Palpitations 07/13/2018  . Paroxysmal atrial flutter ([place])   . Periodic limb movement disorder   . REM sleep behavior disorder   . Rhinitis   . SOB (shortness of breath)   . Spinal stenosis   . Unspecified hypothyroidism     Past Surgical History:  Procedure Laterality Date  . CARDIOVERSION N/A 04/29/2015   Procedure: CARDIOVERSION;  Surgeon: Adrian Prows, MD;  Location: [place];  Service: Cardiovascular;  Laterality: N/A;  . CARPAL TUNNEL RELEASE     Bilateral  . DILATION AND CURETTAGE OF UTERUS    . left rotator cuff    . resection uterine polyp  2011    There were no vitals filed for this visit.  Subjective Assessment - 07/25/19 1105    Subjective  " I got up this moring and the soreness was localized in one spot. Stairs are getting better, I did alot at home.    Currently in Pain?  Yes    Pain Score  3     Pain Location  Hip    Pain Orientation  Posterior;Left    Pain Descriptors / Indicators  Sore;Aching    Pain Type  Chronic pain    Pain Onset  More than a month ago    Pain Frequency  Intermittent                       [facility] Adult PT Treatment/Exercise - 07/25/19 0001      Self-Care   Self-Care  Other Self-Care Comments    Other Self-Care Comments   Self trigger point release for glute med/ piriformis using ball       Lumbar Exercises: Aerobic   Nustep  L6 x 5 min LE only   maintaining MET between 3.0-4.0     Lumbar Exercises: Machines for Strengthening   Leg Press  2 x 10 40# bil LE     cues for controlled     Lumbar Exercises: Standing   Other Standing Lumbar Exercises  4-way hip strengthening bil 1 x 12 with red theraband      Manual Therapy   Manual therapy comments  self lead MTPR along piriformis and glute med x 2 ea.    Joint Mobilization  LAD grade III and IV LLE only,  PT Short Term Goals - 07/05/19 1241      PT SHORT TERM GOAL #1   Title  pt to be I with inital HEp     Period  Weeks    Status  New    Target Date  07/26/19      PT SHORT TERM GOAL #2   Title  pt to demo/ verbalize proper posture during sitting/ standing and lifting and utliizes breaks appropriately to prevent/ reduce low back pain/ tightness    Period  Weeks    Status  New    Target Date  07/26/19        PT Long Term Goals - 07/05/19 1241      PT LONG TERM GOAL #1   Title  increase trunk flexion to >/= 75 degrees for functional mobility required for ADLS    Time  6    Period  Weeks    Status  New    Target Date  08/16/19      PT LONG TERM GOAL #2   Title  pt to be able to stand and walk >/= 1 hour with no report of pain and navigate up/dwon >/= 12 steps with no report of pain for functional mobility    Time  6    Period  Weeks    Status  New    Target Date  08/16/19      PT LONG TERM GOAL #3   Title  pt to increase FOTO score to </= 41% limited to demo improvement in fucntion    Time  6    Period  Weeks    Status  New    Target Date   08/16/19      PT LONG TERM GOAL #4   Title  pt to return to personal exercise with no limtations per pt's personal goal to help reduce low back pain flares    Time  6    Period  Weeks    Status  New    Target Date  08/16/19      PT LONG TERM GOAL #5   Title  pt to be I with all HEp given as of last visit to maintain and progress current level of function    Time  6    Period  Weeks    Status  New    Target Date  08/16/19            Plan - 07/25/19 1139    Clinical Impression Statement  pt reports improvement since the last session with pain at 3/10 today. focused on MTPR along the piriformis and glute med. focused session primarly on hip strengthening bil which she responded well to and noted pain dropped to 1-2/10.    PT Treatment/Interventions  ADLs/Self Care Home Management;Cryotherapy;Electrical Stimulation;Moist Heat;Traction;Ultrasound;Gait training;Therapeutic exercise;Balance training;Neuromuscular re-education;Manual techniques;Patient/family education;Passive range of motion;Dry needling;Taping;Therapeutic activities;Iontophoresis '4mg'$ /ml Dexamethasone;Stair training    PT Next Visit Plan  review/ update HEP PRN, response to TPDN for piriformis, glute max/med and L paraspinals. soft tissue mobilization, hip strengthening LAD, update HEP for 4-way hip.    PT Home Exercise Plan  [id number] - piriformis stretch, single knee to chest, lower trunk rotation, clamshell, supine marching    Consulted and Agree with Plan of Care  Patient       Patient will benefit from skilled therapeutic intervention in order to improve the following deficits and impairments:  Abnormal gait, Postural dysfunction, Increased muscle spasms, Decreased strength, Improper body mechanics, Pain,  Decreased activity tolerance, Decreased endurance, Decreased balance, Decreased safety awareness, Decreased range of motion, Obesity  Visit Diagnosis: Chronic bilateral low back pain, unspecified whether sciatica  present  Muscle weakness (generalized)  Abnormal posture  Other abnormalities of gait and mobility     Problem List Patient Active Problem List   Diagnosis Date Noted  . Palpitations 07/13/2018  . Laboratory examination 07/13/2018  . Aortic arch atherosclerosis ([place]) 07/13/2018  . Sleep talking 06/04/2015  . SOB (shortness of breath)   . Morbid obesity ([place])   . Fibromyalgia   . Hyperlipidemia   . Hypothyroidism   . Paroxysmal atrial flutter ([place])   . ANXIETY DEPRESSION 04/15/2007  . REM SLEEP BEHAVIOR DISORDER 04/15/2007  . UNSPECIFIED HYPOTHYROIDISM 04/14/2007  . EXOGENOUS OBESITY 02/10/2007  . OBSTRUCTIVE SLEEP APNEA 02/10/2007  . PERIODIC LIMB MOVEMENT DISORDER 02/10/2007  . RHINITIS 02/10/2007    Starr Lake PT, DPT, LAT, ATC  07/25/19  11:44 AM      [facility] [address] [place], [place], [address] Phone: [phone]   Fax:  [phone]  Name: [name] [name] MRN: [id number] Date of Birth: 1950/07/15

## 2019-07-27 ENCOUNTER — Other Ambulatory Visit: Payer: Self-pay

## 2019-07-27 ENCOUNTER — Encounter: Payer: Self-pay | Admitting: Cardiology

## 2019-07-27 ENCOUNTER — Ambulatory Visit: Payer: PPO | Attending: Internal Medicine

## 2019-07-27 ENCOUNTER — Ambulatory Visit: Payer: PPO | Admitting: Cardiology

## 2019-07-27 VITALS — BP 135/61 | HR 61 | Temp 97.0°F | Ht 66.0 in | Wt 266.8 lb

## 2019-07-27 DIAGNOSIS — E782 Mixed hyperlipidemia: Secondary | ICD-10-CM | POA: Diagnosis not present

## 2019-07-27 DIAGNOSIS — I4892 Unspecified atrial flutter: Secondary | ICD-10-CM

## 2019-07-27 DIAGNOSIS — Z23 Encounter for immunization: Secondary | ICD-10-CM

## 2019-07-27 DIAGNOSIS — I10 Essential (primary) hypertension: Secondary | ICD-10-CM | POA: Diagnosis not present

## 2019-07-27 MED ORDER — OLMESARTAN MEDOXOMIL 5 MG PO TABS: 10.0000 mg | ORAL_TABLET | Freq: Every day | ORAL | 3 refills | Status: DC

## 2019-07-27 NOTE — Progress Notes (Signed)
Primary Physician:  Burnard Bunting, MD  Patient ID: [name] [name], female    DOB: January 06, 1951, 69 y.o.   MRN: [id number]  Subjective:   Chief Complaint  Patient presents with  . Atrial Flutter  . Hypertension  . Follow-up    6 month   HPI: [name] [name]  is a 69 y.o. female  with paroxysmal atypical atrial flutter, hyperlipidemia, morbid obesity and hypertension. She was found to be in atrial flutter with rapid ventricular response on 03/13/2015. It was felt to be atypical atrial flutter and was evaluated by Dr. [name], recommended cardioversion, successful direct current cardioversion on 04/29/2015 with no recurrence.  Patient is here for hypertension and A. Flutter.  She is presently doing well without any significant complaints except left hip pain.  She is tolerating low-dose olmesartan and metoprolol blood pressure is controlled. She has been checking BP at home and numbers have been good but frequently BP in 150 mm Hg. No chest pain or shortness of breath, tolerating anticoagulation well.  No syncope.     Past Medical History:  Diagnosis Date  . Anxiety and depression   . Aortic arch atherosclerosis ([place]) 07/13/2018  . Exogenous obesity   . Fibromyalgia   . Hyperlipidemia   . Hypothyroidism   . Morbid obesity ([place])   . OSA (obstructive sleep apnea)    uses CPAP  . Palpitations 07/13/2018  . Paroxysmal atrial flutter ([place])   . Periodic limb movement disorder   . REM sleep behavior disorder   . Rhinitis   . SOB (shortness of breath)   . Spinal stenosis   . Unspecified hypothyroidism     Past Surgical History:  Procedure Laterality Date  . CARDIOVERSION N/A 04/29/2015   Procedure: CARDIOVERSION;  Surgeon: Adrian Prows, MD;  Location: [place];  Service: Cardiovascular;  Laterality: N/A;  . CARPAL TUNNEL RELEASE     Bilateral  . DILATION AND CURETTAGE OF UTERUS    . left rotator cuff    . resection uterine polyp  2011   Social History   Tobacco Use  .  Smoking status: Never Smoker  . Smokeless tobacco: Never Used  Substance Use Topics  . Alcohol use: No    Review of Systems  Cardiovascular: Positive for dyspnea on exertion. Negative for chest pain and leg swelling.  Musculoskeletal: Positive for arthritis (left hip) and back pain.  Gastrointestinal: Negative for melena.   Objective:  Blood pressure 135/61, pulse 61, temperature (!) 97 F (36.1 C), height '5\' 6"'$  (1.676 m), weight 266 lb 12.8 oz (121 kg), SpO2 100 %. Body mass index is 43.06 kg/m.   Vitals with BMI 07/27/2019 01/23/2019 10/17/2018  Height '5\' 6"'$  '5\' 5"'$  '5\' 5"'$   Weight 266 lbs 13 oz 260 lbs 247 lbs  BMI 43.08 52.77 82.4  Systolic 235 361 443  Diastolic 61 70 71  Pulse 61 68 65    Physical Exam  Constitutional:  Morbidly obese  Neck:  Short neck and difficult to evaluate JVP  Cardiovascular: Normal rate, regular rhythm and normal heart sounds. Exam reveals no gallop.  No murmur heard. Pulses:      Carotid pulses are 2+ on the right side and 2+ on the left side.      Dorsalis pedis pulses are 2+ on the right side and 2+ on the left side.       Posterior tibial pulses are 2+ on the right side and 2+ on the left side.  Femoral and popliteal  pulse difficult to feel due to patient's body habitus.   No JVD No leg edema  Pulmonary/Chest: Effort normal and breath sounds normal.  Abdominal: Soft. Bowel sounds are normal.  Obese   Radiology: No results found.  Laboratory examination:   CMP Latest Ref Rng & Units 11/14/2018 06/09/2018 04/30/2015  Glucose 65 - 99 mg/dL 110(H) 79 121(H)  BUN 8 - 27 mg/dL '17 17 15  '$ Creatinine 0.57 - 1.00 mg/dL 0.90 0.86 0.85  Sodium 134 - 144 mmol/L 143 142 140  Potassium 3.5 - 5.2 mmol/L 4.4 4.3 4.3  Chloride 96 - 106 mmol/L 106 102 109  CO2 20 - 29 mmol/L '23 24 23  '$ Calcium 8.7 - 10.3 mg/dL 9.0 9.2 9.1  Total Protein 6.0 - 8.5 g/dL 6.9 - -  Total Bilirubin 0.0 - 1.2 mg/dL 0.3 - -  Alkaline Phos 39 - 117 IU/L 47 - -  AST 0 - 40 IU/L  17 - -  ALT 0 - 32 IU/L 12 - -   CBC Latest Ref Rng & Units 11/14/2018 06/09/2018 04/30/2015  WBC 3.4 - 10.8 x10E3/uL 5.9 6.4 7.4  Hemoglobin 11.1 - 15.9 g/dL 12.5 13.1 11.6(L)  Hematocrit 34.0 - 46.6 % 37.3 39.4 36.2  Platelets 150 - 450 x10E3/uL 178 214 158   Lipid Panel     Component Value Date/Time   CHOL 129 11/14/2018 0830   TRIG 83 11/14/2018 0830   HDL 49 11/14/2018 0830   LDLCALC 63 11/14/2018 0830   HEMOGLOBIN A1C No results found for: HGBA1C, MPG TSH Recent Labs    11/14/18 0830  TSH 1.820   External labs:  11/21/2017: Cholesterol 133, triglycerides 88, HDL 47, LDL 88.  Creatinine 0.94, EGFR 63/72, potassium 4.7, CMP normal.  CBC normal.  PRN Meds:. Medications Discontinued During This Encounter  Medication Reason  . olmesartan (BENICAR) 5 MG tablet Reorder   Current Meds  Medication Sig  . Apple Cider Vinegar 188 MG CAPS Take by mouth.  . Ascorbic Acid (VITAMIN C) 100 MG tablet Take 100 mg by mouth daily.  Marland Kitchen levothyroxine (SYNTHROID) 175 MCG tablet Take 175 mcg by mouth daily.  . metoprolol tartrate (LOPRESSOR) 25 MG tablet TAKE 1 TABLET BY MOUTH THREE TIMES A DAY  . olmesartan (BENICAR) 5 MG tablet Take 2 tablets (10 mg total) by mouth daily.  . rosuvastatin (CRESTOR) 10 MG tablet TAKE ONE HALF TABLET BY MOUTH ONCE A DAY.  Marland Kitchen VITAMIN D PO Take 5,000 Units by mouth daily.   Alveda Reasons 20 MG TABS tablet TAKE 1 TABLET BY MOUTH EVERY EVENING  . [DISCONTINUED] olmesartan (BENICAR) 5 MG tablet TAKE 1 TABLET BY MOUTH EVERY DAY    Cardiac Studies:   Sleep Study 2006: Used CPAP. Repeat sleep study. 05/28/2015 by Dr. Caryl Comes: No e/o sleep apnea.  Chest x-ray 04/30/2015: Mild CHF, atherosclerotic calcification the aortic arch.  Direct current cardioversion 04/29/2015: A. Flutter to NSR with 75J x 1.  Treadmill Exercise stress 10/11/2014: Indication:Palpitations, Chest pain The patient exercised according to Bruce Protocol, Total time recorded 04:14mn achieving max  heart rate of 141 which was 90% of MPHR for age and 5.80 METS of work. Normal BP response. Resting ECG NSR, low voltage complexes. There was no ST-T changes of ischemia with exercise stress test. Stress terminated due to THR (>85% MPHR)/MPHR met and fatigue. Decreased exercise tolerence.  EKG:  EKG 07/27/2019: Normal sinus rhythm with rate of 64 bpm, left abnormality, normal axis.  No evidence of ischemia, normal EKG.  No significant change from EKG 01/23/2019   Assessment:     ICD-10-CM   1. Paroxysmal atrial flutter (HCC) CHA2DS2-VASc Score is 4.  Yearly risk of stroke: 4.0% (A, F, HTN, Vasc Dz).  I48.92 EKG 12-Lead   Atypical AFL  2. Essential hypertension  I10   3. Mixed hyperlipidemia  E78.2    Recommendations:    DENELL COTHERN  is a 69 y.o. female  with paroxysmal atypical atrial flutter, hyperlipidemia, morbid obesity and hypertension. She was found to be in atypical atrial flutter with rapid ventricular response on 11/21/5748 and diastolic heart failure. It was felt to be atypical atrial flutter and was evaluated by Dr. [name], recommended cardioversion, successful direct current cardioversion on 04/29/2015 with no recurrence. She is on anticoagulation due to high embolic risk and patient preference.   Patient is overall doing well, blood pressure has significantly improved with olmesartan still not at goal, will increase to 10 mg, she is very skeptical for taking higher dose.  Lipids are at goal, she is maintaining sinus rhythm, continue low-dose beta-blocker.  Weight loss with can discussed extensively.  I will see her back on yearly basis.  Adrian Prows, MD, [facility] 07/28/2019, 9:27 AM [facility] [place] Office: [phone]

## 2019-07-27 NOTE — Progress Notes (Signed)
   Covid-19 Vaccination Clinic  Name:  [name] [name]    MRN: [id number] DOB: 03-03-1951  07/27/2019  [name] [name] was observed post Covid-19 immunization for 15 minutes without incident. She was provided with Vaccine Information Sheet and instruction to access the V-Safe system.   [name] [name] was instructed to call 911 with any severe reactions post vaccine: Marland Kitchen Difficulty breathing  . Swelling of face and throat  . A fast heartbeat  . A bad rash all over body  . Dizziness and weakness   Immunizations Administered    Name Date Dose VIS Date Route   Pfizer COVID-19 Vaccine 07/27/2019  1:39 PM 0.3 mL 04/13/2019 Intramuscular   Manufacturer: [employer], [employer]   Lot: [id number]   NDC: [address]-5

## 2019-07-30 ENCOUNTER — Other Ambulatory Visit: Payer: Self-pay

## 2019-07-30 ENCOUNTER — Encounter: Payer: Self-pay | Admitting: Physical Therapy

## 2019-07-30 ENCOUNTER — Ambulatory Visit: Payer: PPO | Admitting: Physical Therapy

## 2019-07-30 DIAGNOSIS — M545 Low back pain, unspecified: Secondary | ICD-10-CM

## 2019-07-30 DIAGNOSIS — M6281 Muscle weakness (generalized): Secondary | ICD-10-CM

## 2019-07-30 DIAGNOSIS — G8929 Other chronic pain: Secondary | ICD-10-CM

## 2019-07-30 NOTE — Therapy (Signed)
[place], [place], [address] Phone: [phone]   Fax:  [phone]  Physical Therapy Treatment  Patient Details  Name: [name] [name] MRN: [id number] Date of Birth: 1950-12-11 Referring Provider (PT): Rennis Harding MD   Encounter Date: 07/30/2019  PT End of Session - 07/30/19 1048    Visit Number  5    Number of Visits  13    Date for PT Re-Evaluation  08/16/19    Authorization Type  MCR: Kx mod at 15th visit    PT Start Time  1050    PT Stop Time  1135    PT Time Calculation (min)  45 min    Activity Tolerance  Patient tolerated treatment well    Behavior During Therapy  [facility] for tasks assessed/performed       Past Medical History:  Diagnosis Date  . Anxiety and depression   . Aortic arch atherosclerosis ([place]) 07/13/2018  . Exogenous obesity   . Fibromyalgia   . Hyperlipidemia   . Hypothyroidism   . Morbid obesity ([place])   . OSA (obstructive sleep apnea)    uses CPAP  . Palpitations 07/13/2018  . Paroxysmal atrial flutter ([place])   . Periodic limb movement disorder   . REM sleep behavior disorder   . Rhinitis   . SOB (shortness of breath)   . Spinal stenosis   . Unspecified hypothyroidism     Past Surgical History:  Procedure Laterality Date  . CARDIOVERSION N/A 04/29/2015   Procedure: CARDIOVERSION;  Surgeon: Adrian Prows, MD;  Location: [place];  Service: Cardiovascular;  Laterality: N/A;  . CARPAL TUNNEL RELEASE     Bilateral  . DILATION AND CURETTAGE OF UTERUS    . left rotator cuff    . resection uterine polyp  2011    There were no vitals filed for this visit.  Subjective Assessment - 07/30/19 1050    Subjective  "I am doing pretty good today. I did my usual walking and grocery shopping on Saturday and I started to have alot of pain that was limiting me"    Patient Stated Goals  to decrease pain and know how control pain    Currently in Pain?  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    Referring Provider (PT)  Rennis Harding MD                   [facility] Adult PT Treatment/Exercise - 07/30/19 0001      Lumbar Exercises: Stretches   Active Hamstring Stretch  3 reps;30 seconds   PNF contract/ relax   Lower Trunk Rotation Limitations  2 x 15      Lumbar Exercises: Supine   Straight Leg Raise  1 second;10 reps   LLE only      Knee/Hip Exercises: Standing   Gait Training  2 x 20 ft focusing on heel strike/ toe off, 2 x 20 adding reciprocall arm swing which she need mod cues, utilzing trekking poles to promote swing which pt noted relief of pain inthe hips/ back.       Manual Therapy   Manual Therapy  Muscle Energy Technique    Manual therapy comments  MTPR along the anterior tib    Joint Mobilization  LAD grade V LLE only,  Muscle Energy Technique  resisted L hip flexion 5 x 10 sec hold               PT Short Term Goals - 07/05/19 1241      PT SHORT TERM GOAL #1   Title  pt to be I with inital HEp     Period  Weeks    Status  New    Target Date  07/26/19      PT SHORT TERM GOAL #2   Title  pt to demo/ verbalize proper posture during sitting/ standing and lifting and utliizes breaks appropriately to prevent/ reduce low back pain/ tightness    Period  Weeks    Status  New    Target Date  07/26/19        PT Long Term Goals - 07/05/19 1241      PT LONG TERM GOAL #1   Title  increase trunk flexion to >/= 75 degrees for functional mobility required for ADLS    Time  6    Period  Weeks    Status  New    Target Date  08/16/19      PT LONG TERM GOAL #2   Title  pt to be able to stand and walk >/= 1 hour with no report of pain and navigate up/dwon >/= 12 steps with no report of pain for functional mobility    Time  6    Period  Weeks    Status  New    Target Date  08/16/19      PT LONG TERM GOAL #3   Title  pt to  increase FOTO score to </= 41% limited to demo improvement in fucntion    Time  6    Period  Weeks    Status  New    Target Date  08/16/19      PT LONG TERM GOAL #4   Title  pt to return to personal exercise with no limtations per pt's personal goal to help reduce low back pain flares    Time  6    Period  Weeks    Status  New    Target Date  08/16/19      PT LONG TERM GOAL #5   Title  pt to be I with all HEp given as of last visit to maintain and progress current level of function    Time  6    Period  Weeks    Status  New    Target Date  08/16/19            Plan - 07/30/19 1145    Clinical Impression Statement  pt reports ipmrovement in pain today but does continue to having increased pain with prolonged walking/ standing. continued working on hip focusing mor eon SIJ which she doesn't have increased L hamstring tension which provided concordant pain during stretching. She responded well to MET and reported no pain with efficient gait training but did require increased cues for proper form.    PT Treatment/Interventions  ADLs/Self Care Home Management;Cryotherapy;Electrical Stimulation;Moist Heat;Traction;Ultrasound;Gait training;Therapeutic exercise;Balance training;Neuromuscular re-education;Manual techniques;Patient/family education;Passive range of motion;Dry needling;Taping;Therapeutic activities;Iontophoresis '4mg'$ /ml Dexamethasone;Stair training    PT Next Visit Plan  review/ update HEP PRN, response to TPDN for piriformis, glute max/med and L paraspinals. soft tissue mobilization, hip strengthening LAD, update HEP for 4-way hip.    PT Home Exercise Plan  [id number] - piriformis stretch, single knee to chest, lower trunk rotation, clamshell, supine marching,  hamstring stretch (supine/ seated), SLR    Consulted and Agree with Plan of Care  Patient       Patient will benefit from skilled therapeutic intervention in order to improve the following deficits and impairments:   Abnormal gait, Postural dysfunction, Increased muscle spasms, Decreased strength, Improper body mechanics, Pain, Decreased activity tolerance, Decreased endurance, Decreased balance, Decreased safety awareness, Decreased range of motion, Obesity  Visit Diagnosis: Chronic bilateral low back pain, unspecified whether sciatica present  Muscle weakness (generalized)     Problem List Patient Active Problem List   Diagnosis Date Noted  . Palpitations 07/13/2018  . Laboratory examination 07/13/2018  . Aortic arch atherosclerosis ([place]) 07/13/2018  . Sleep talking 06/04/2015  . SOB (shortness of breath)   . Morbid obesity ([place])   . Fibromyalgia   . Hyperlipidemia   . Hypothyroidism   . Paroxysmal atrial flutter ([place])   . ANXIETY DEPRESSION 04/15/2007  . REM SLEEP BEHAVIOR DISORDER 04/15/2007  . UNSPECIFIED HYPOTHYROIDISM 04/14/2007  . EXOGENOUS OBESITY 02/10/2007  . OBSTRUCTIVE SLEEP APNEA 02/10/2007  . PERIODIC LIMB MOVEMENT DISORDER 02/10/2007  . RHINITIS 02/10/2007   Starr Lake PT, DPT, LAT, ATC  07/30/19  11:48 AM      [facility] [address] [place], [place], [address] Phone: [phone]   Fax:  [phone]  Name: ISMERAI BIN MRN: [id number] Date of Birth: [birth year]

## 2019-08-01 ENCOUNTER — Ambulatory Visit: Payer: PPO | Admitting: Physical Therapy

## 2019-08-01 ENCOUNTER — Other Ambulatory Visit: Payer: Self-pay

## 2019-08-01 ENCOUNTER — Encounter: Payer: Self-pay | Admitting: Physical Therapy

## 2019-08-01 DIAGNOSIS — R293 Abnormal posture: Secondary | ICD-10-CM

## 2019-08-01 DIAGNOSIS — M545 Low back pain, unspecified: Secondary | ICD-10-CM

## 2019-08-01 DIAGNOSIS — G8929 Other chronic pain: Secondary | ICD-10-CM

## 2019-08-01 DIAGNOSIS — R2689 Other abnormalities of gait and mobility: Secondary | ICD-10-CM

## 2019-08-01 DIAGNOSIS — M6281 Muscle weakness (generalized): Secondary | ICD-10-CM

## 2019-08-01 NOTE — Therapy (Signed)
[place], [place], [address] Phone: [phone]   Fax:  [phone]  Physical Therapy Treatment  Patient Details  Name: [name] [name] MRN: [id number] Date of Birth: 07-10-1950 Referring Provider (PT): Rennis Harding MD   Encounter Date: 08/01/2019  PT End of Session - 08/01/19 1105    Visit Number  6    Number of Visits  13    Date for PT Re-Evaluation  08/16/19    Authorization Type  MCR: Kx mod at 15th visit    Progress Note Due on Visit  10    PT Start Time  1101    PT Stop Time  1148    PT Time Calculation (min)  47 min    Activity Tolerance  Patient tolerated treatment well    Behavior During Therapy  [facility] for tasks assessed/performed       Past Medical History:  Diagnosis Date  . Anxiety and depression   . Aortic arch atherosclerosis ([place]) 07/13/2018  . Exogenous obesity   . Fibromyalgia   . Hyperlipidemia   . Hypothyroidism   . Morbid obesity ([place])   . OSA (obstructive sleep apnea)    uses CPAP  . Palpitations 07/13/2018  . Paroxysmal atrial flutter ([place])   . Periodic limb movement disorder   . REM sleep behavior disorder   . Rhinitis   . SOB (shortness of breath)   . Spinal stenosis   . Unspecified hypothyroidism     Past Surgical History:  Procedure Laterality Date  . CARDIOVERSION N/A 04/29/2015   Procedure: CARDIOVERSION;  Surgeon: Adrian Prows, MD;  Location: [place];  Service: Cardiovascular;  Laterality: N/A;  . CARPAL TUNNEL RELEASE     Bilateral  . DILATION AND CURETTAGE OF UTERUS    . left rotator cuff    . resection uterine polyp  2011    There were no vitals filed for this visit.  Subjective Assessment - 08/01/19 1108    Subjective  " I can tell that I am much better and I am up and down more frequently. I was alittle tight / sore in the back of my R hip."    Patient Stated Goals  to decrease pain and know how control pain    Currently in Pain?  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    Referring Provider (PT)  Rennis Harding MD      Observation/Other Assessments   Focus on Therapeutic Outcomes (FOTO)   43% limited                   [facility] Adult PT Treatment/Exercise - 08/01/19 0001      Lumbar Exercises: Stretches   Active Hamstring Stretch  4 reps;30 seconds   contract/ relax     Lumbar Exercises: Aerobic   Nustep  L6 x 5 min UE/LE only      Lumbar Exercises: Supine   Bent Knee Raise  15 reps   , cues to keep core tight throughout   Bent Knee Raise Limitations  with green theraband around knees    Straight Leg Raise  1 second   going to fatigue with LLE only     Lumbar Exercises: Sidelying   Hip Abduction  15 reps;Left      Knee/Hip Exercises: Standing   Stairs  performed initally up/down  6 inch step x 2, able to perform steps following treatment with improvement with climbing steps x 6    Gait Training  1 x 20 ft with with heel strike/ toe       Manual Therapy   Joint Mobilization  LAD grade V LLE only,     Muscle Energy Technique  resisted L hip flexion 5 x 10 sec hold               PT Short Term Goals - 08/01/19 1112      PT SHORT TERM GOAL #1   Title  pt to be I with inital HEp     Period  Weeks    Status  Achieved      PT SHORT TERM GOAL #2   Title  pt to demo/ verbalize proper posture during sitting/ standing and lifting and utliizes breaks appropriately to prevent/ reduce low back pain/ tightness    Period  Weeks    Status  Achieved        PT Long Term Goals - 07/05/19 1241      PT LONG TERM GOAL #1   Title  increase trunk flexion to >/= 75 degrees for functional mobility required for ADLS    Time  6    Period  Weeks    Status  New    Target Date  08/16/19      PT LONG TERM GOAL #2   Title  pt to be able to stand and walk >/= 1 hour with no report of pain and navigate up/dwon >/= 12 steps with  no report of pain for functional mobility    Time  6    Period  Weeks    Status  New    Target Date  08/16/19      PT LONG TERM GOAL #3   Title  pt to increase FOTO score to </= 41% limited to demo improvement in fucntion    Time  6    Period  Weeks    Status  New    Target Date  08/16/19      PT LONG TERM GOAL #4   Title  pt to return to personal exercise with no limtations per pt's personal goal to help reduce low back pain flares    Time  6    Period  Weeks    Status  New    Target Date  08/16/19      PT LONG TERM GOAL #5   Title  pt to be I with all HEp given as of last visit to maintain and progress current level of function    Time  6    Period  Weeks    Status  New    Target Date  08/16/19            Plan - 08/01/19 1207    Clinical Impression Statement  pt reports continued improvement in pain and is improving her FOTO score today from 55% limited to 43% limited today. contineud focus on hip strengthening focusing on core and hip flexors, she did well following exericse and noted improvemetn with stair training with mild soreness. pt's pain continues to be focused on L and she responds well with long axis distraction suggesting potentila hip and SIJ invovlement.    PT Treatment/Interventions  ADLs/Self Care Home Management;Cryotherapy;Electrical Stimulation;Moist Heat;Traction;Ultrasound;Gait training;Therapeutic exercise;Balance training;Neuromuscular re-education;Manual techniques;Patient/family education;Passive range of motion;Dry needling;Taping;Therapeutic activities;Iontophoresis 4mg /ml Dexamethasone;Stair training    PT Next Visit Plan  review/ update HEP PRN, response to TPDN for piriformis, glute max/med and L paraspinals. soft tissue mobilization, hip strengthening LAD, update HEP for 4-way hip.    PT Home Exercise Plan  [id number] - piriformis stretch, single knee to chest, lower trunk rotation, clamshell, supine marching, hamstring stretch (supine/ seated),  SLR    Consulted and Agree with Plan of Care  Patient       Patient will benefit from skilled therapeutic intervention in order to improve the following deficits and impairments:  Abnormal gait, Postural dysfunction, Increased muscle spasms, Decreased strength, Improper body mechanics, Pain, Decreased activity tolerance, Decreased endurance, Decreased balance, Decreased safety awareness, Decreased range of motion, Obesity  Visit Diagnosis: Chronic bilateral low back pain, unspecified whether sciatica present  Muscle weakness (generalized)  Abnormal posture  Other abnormalities of gait and mobility     Problem List Patient Active Problem List   Diagnosis Date Noted  . Palpitations 07/13/2018  . Laboratory examination 07/13/2018  . Aortic arch atherosclerosis ([facility]) 07/13/2018  . Sleep talking 06/04/2015  . SOB (shortness of breath)   . Morbid obesity ([facility])   . Fibromyalgia   . Hyperlipidemia   . Hypothyroidism   . Paroxysmal atrial flutter ([facility])   . ANXIETY DEPRESSION 04/15/2007  . REM SLEEP BEHAVIOR DISORDER 04/15/2007  . UNSPECIFIED HYPOTHYROIDISM 04/14/2007  . EXOGENOUS OBESITY 02/10/2007  . OBSTRUCTIVE SLEEP APNEA 02/10/2007  . PERIODIC LIMB MOVEMENT DISORDER 02/10/2007  . RHINITIS 02/10/2007    Lulu Riding PT, DPT, LAT, ATC  08/01/19  12:12 PM      [facility] [address] [place], [place], [address] Phone: [phone]   Fax:  [phone]  Name: [name] [name] MRN: [id number] Date of Birth: [birth year]

## 2019-08-06 ENCOUNTER — Other Ambulatory Visit: Payer: Self-pay

## 2019-08-06 ENCOUNTER — Encounter: Payer: Self-pay | Admitting: Physical Therapy

## 2019-08-06 ENCOUNTER — Ambulatory Visit: Payer: PPO | Attending: Orthopaedic Surgery | Admitting: Physical Therapy

## 2019-08-06 DIAGNOSIS — M545 Low back pain: Secondary | ICD-10-CM | POA: Insufficient documentation

## 2019-08-06 DIAGNOSIS — R293 Abnormal posture: Secondary | ICD-10-CM | POA: Insufficient documentation

## 2019-08-06 DIAGNOSIS — G8929 Other chronic pain: Secondary | ICD-10-CM | POA: Diagnosis not present

## 2019-08-06 DIAGNOSIS — R2689 Other abnormalities of gait and mobility: Secondary | ICD-10-CM | POA: Insufficient documentation

## 2019-08-06 DIAGNOSIS — M6281 Muscle weakness (generalized): Secondary | ICD-10-CM | POA: Insufficient documentation

## 2019-08-06 NOTE — Therapy (Addendum)
[facility] [address] [place], [place], [address] Phone: [phone]   Fax:  [phone]  Physical Therapy Treatment  Patient Details  Name: [name] [name] MRN: [id number] Date of Birth: Sep 18, 1950 Referring Provider (PT): Sharolyn Douglas MD   Encounter Date: 08/06/2019  PT End of Session - 08/06/19 1058    Visit Number  7    Number of Visits  13    Date for PT Re-Evaluation  08/16/19    Authorization Type  MCR: Kx mod at 15th visit    PT Start Time  1100    PT Stop Time  1145    PT Time Calculation (min)  45 min    Activity Tolerance  Patient tolerated treatment well    Behavior During Therapy  [facility] for tasks assessed/performed       Past Medical History:  Diagnosis Date  . Anxiety and depression   . Aortic arch atherosclerosis ([facility]) 07/13/2018  . Exogenous obesity   . Fibromyalgia   . Hyperlipidemia   . Hypothyroidism   . Morbid obesity ([facility])   . OSA (obstructive sleep apnea)    uses CPAP  . Palpitations 07/13/2018  . Paroxysmal atrial flutter ([facility])   . Periodic limb movement disorder   . REM sleep behavior disorder   . Rhinitis   . SOB (shortness of breath)   . Spinal stenosis   . Unspecified hypothyroidism     Past Surgical History:  Procedure Laterality Date  . CARDIOVERSION N/A 04/29/2015   Procedure: CARDIOVERSION;  Surgeon: Yates Decamp, MD;  Location: [facility] ENDOSCOPY;  Service: Cardiovascular;  Laterality: N/A;  . CARPAL TUNNEL RELEASE     Bilateral  . DILATION AND CURETTAGE OF UTERUS    . left rotator cuff    . resection uterine polyp  2011    There were no vitals filed for this visit.  Subjective Assessment - 08/06/19 1101    Subjective  Patient reports off and on pain in her Lt. LE. She reports that her pain increased after standing for an extended period of time yesterday. She is has been sore from egg toss and underhand throwing. She does note that she didn't perform her home exercises yesterday, but says  she is doing better than baseline.    Limitations  Standing;Walking    How long can you sit comfortably?  unlimited    How long can you stand comfortably?  25 mins    How long can you walk comfortably?  25 mins    Diagnostic tests  nothing recently    Patient Stated Goals  to decrease pain and know how control pain    Currently in Pain?  Yes    Pain Score  5     Pain Location  Hip    Pain Orientation  Left    Pain Descriptors / Indicators  Sore;Aching    Pain Type  Chronic pain    Pain Radiating Towards  LLE referred pain down to the L knee and shin    Pain Onset  More than a month ago    Pain Frequency  Intermittent    Aggravating Factors   Standing for sitting, stairs, prolonged standing    Pain Relieving Factors  Resting         [facility] PT Assessment - 08/06/19 0001      ROM / Strength   AROM / PROM / Strength  AROM      AROM   Lumbar Flexion  52  Lumbar Extension  10    Lumbar - Right Side Bend  18    Lumbar - Left Side Bend  18      Strength   Right Hip Flexion  3+/5      Special Tests    Special Tests  Lumbar    Other special tests  Quadrant Test Positive on the left side                    [facility] Adult PT Treatment/Exercise - 08/06/19 0001      Lumbar Exercises: Stretches   Active Hamstring Stretch  1 rep;60 seconds   contact/relax      Lumbar Exercises: Supine   Straight Leg Raise  10 reps   w/ 2lb weight    Straight Leg Raises Limitations  Lt. leg only     Other Supine Lumbar Exercises  Ball squeeze with the knee in hooklying       Lumbar Exercises: Sidelying   Hip Abduction  Left;15 reps      Manual Therapy   Joint Mobilization  LAD grade V LLE only,     Soft tissue mobilization  Manual Trigger point release over the glute med/min    Muscle Energy Technique  resisted L hip flexion 5 x 10 sec hold             PT Education - 08/06/19 1307    Education Details  Patient educated on leg length discrepancy. She was also informed  about heel lift and expected changes in pain and comfort    Person(s) Educated  Patient    Methods  Explanation    Comprehension  Verbalized understanding       PT Short Term Goals - 08/01/19 1112      PT SHORT TERM GOAL #1   Title  pt to be I with inital HEp     Period  Weeks    Status  Achieved      PT SHORT TERM GOAL #2   Title  pt to demo/ verbalize proper posture during sitting/ standing and lifting and utliizes breaks appropriately to prevent/ reduce low back pain/ tightness    Period  Weeks    Status  Achieved        PT Long Term Goals - 08/06/19 1113      PT LONG TERM GOAL #1   Title  increase trunk flexion to >/= 75 degrees for functional mobility required for ADLS    Baseline  52 degrees of  lumbar flexion    Time  6    Period  Weeks    Status  On-going    Target Date  08/16/19      PT LONG TERM GOAL #2   Title  pt to be able to stand and walk >/= 1 hour with no report of pain and navigate up/dwon >/= 12 steps with no report of pain for functional mobility    Time  6    Period  Weeks    Status  On-going    Target Date  08/16/19      PT LONG TERM GOAL #3   Title  pt to increase FOTO score to </= 41% limited to demo improvement in fucntion    Time  6    Period  Weeks    Status  On-going    Target Date  08/16/19      PT LONG TERM GOAL #4   Title  pt to return to  personal exercise with no limtations per pt's personal goal to help reduce low back pain flares    Time  6    Period  Weeks    Status  On-going    Target Date  08/16/19      PT LONG TERM GOAL #5   Title  pt to be I with all HEp given as of last visit to maintain and progress current level of function    Time  6    Period  Weeks    Status  On-going    Target Date  08/16/19            Plan - 08/06/19 1139    Clinical Impression Statement  Patient reports that she continues to have pain down her Lt. LE. She reported numbness and relief with trigger point  releases in the glute med/ min  area. She reports no pain after manual therapy was provided. She achieved 52 degrees of lumbar flexion and 10 degrees of extension. Patient was provided a heel lift. She continues to show symptoms consistent with muscle imbalance secondary to a high likelihood of SIJ and hip dysfunction.    Personal Factors and Comorbidities  Comorbidity 3+;Age    Comorbidities  hx of anxiety, depression, chronic low back pain    Examination-Activity Limitations  Locomotion Level;Stand;Stairs    Stability/Clinical Decision Making  Evolving/Moderate complexity    Clinical Decision Making  Moderate    Rehab Potential  Good    PT Frequency  2x / week    PT Duration  6 weeks    PT Treatment/Interventions  ADLs/Self Care Home Management;Cryotherapy;Electrical Stimulation;Moist Heat;Traction;Ultrasound;Gait training;Therapeutic exercise;Balance training;Neuromuscular re-education;Manual techniques;Patient/family education;Passive range of motion;Dry needling;Taping;Therapeutic activities;Iontophoresis 4mg /ml Dexamethasone;Stair training    PT Next Visit Plan  review/ update HEP PRN, response to TPDN for piriformis, glute max/med and L paraspinals. soft tissue mobilization, hip strengthening LAD, update HEP for 4-way hip. Ask patient if she finds heel lift to be beneficial    PT Home Exercise Plan  [id number] - piriformis stretch, single knee to chest, lower trunk rotation, clamshell, supine marching, hamstring stretch (supine/ seated), SLR    Consulted and Agree with Plan of Care  Patient       Patient will benefit from skilled therapeutic intervention in order to improve the following deficits and impairments:  Abnormal gait, Postural dysfunction, Increased muscle spasms, Decreased strength, Improper body mechanics, Pain, Decreased activity tolerance, Decreased endurance, Decreased balance, Decreased safety awareness, Decreased range of motion, Obesity  Visit Diagnosis: Chronic bilateral low back pain, unspecified  whether sciatica present  Muscle weakness (generalized)  Abnormal posture  Other abnormalities of gait and mobility     Problem List Patient Active Problem List   Diagnosis Date Noted  . Palpitations 07/13/2018  . Laboratory examination 07/13/2018  . Aortic arch atherosclerosis ([facility]) 07/13/2018  . Sleep talking 06/04/2015  . SOB (shortness of breath)   . Morbid obesity ([facility])   . Fibromyalgia   . Hyperlipidemia   . Hypothyroidism   . Paroxysmal atrial flutter ([facility])   . ANXIETY DEPRESSION 04/15/2007  . REM SLEEP BEHAVIOR DISORDER 04/15/2007  . UNSPECIFIED HYPOTHYROIDISM 04/14/2007  . EXOGENOUS OBESITY 02/10/2007  . OBSTRUCTIVE SLEEP APNEA 02/10/2007  . PERIODIC LIMB MOVEMENT DISORDER 02/10/2007  . RHINITIS 02/10/2007    04/12/2007, SPT 08/06/2019, 1:14 PM  [facility] [address] [place], [place], [place] Phone: [phone]   Fax:  [phone]  Name: [name] [name] MRN: [name] Date  of Birth: [birth year]

## 2019-08-08 ENCOUNTER — Other Ambulatory Visit: Payer: Self-pay

## 2019-08-08 ENCOUNTER — Ambulatory Visit: Payer: PPO | Admitting: Physical Therapy

## 2019-08-08 DIAGNOSIS — M6281 Muscle weakness (generalized): Secondary | ICD-10-CM

## 2019-08-08 DIAGNOSIS — R293 Abnormal posture: Secondary | ICD-10-CM

## 2019-08-08 DIAGNOSIS — R2689 Other abnormalities of gait and mobility: Secondary | ICD-10-CM

## 2019-08-08 DIAGNOSIS — Z6841 Body Mass Index (BMI) 40.0 and over, adult: Secondary | ICD-10-CM | POA: Diagnosis not present

## 2019-08-08 DIAGNOSIS — Q762 Congenital spondylolisthesis: Secondary | ICD-10-CM | POA: Diagnosis not present

## 2019-08-08 DIAGNOSIS — M47816 Spondylosis without myelopathy or radiculopathy, lumbar region: Secondary | ICD-10-CM | POA: Diagnosis not present

## 2019-08-08 DIAGNOSIS — M545 Low back pain: Secondary | ICD-10-CM | POA: Diagnosis not present

## 2019-08-08 DIAGNOSIS — G8929 Other chronic pain: Secondary | ICD-10-CM

## 2019-08-08 DIAGNOSIS — M810 Age-related osteoporosis without current pathological fracture: Secondary | ICD-10-CM | POA: Diagnosis not present

## 2019-08-08 NOTE — Therapy (Signed)
[facility] [address] [place], [place], [address] Phone: [phone]   Fax:  [phone]  Physical Therapy Treatment  Patient Details  Name: [name] [name] MRN: [id number] Date of Birth: October 14, 1950 Referring Provider (PT): Sharolyn Douglas MD   Encounter Date: 08/08/2019  PT End of Session - 08/08/19 1333    Visit Number  8    Number of Visits  13    Date for PT Re-Evaluation  08/16/19    Authorization Type  MCR: Kx mod at 15th visit    Progress Note Due on Visit  10    PT Start Time  1334    PT Stop Time  1416    PT Time Calculation (min)  42 min    Activity Tolerance  Patient tolerated treatment well    Behavior During Therapy  [facility] for tasks assessed/performed       Past Medical History:  Diagnosis Date  . Anxiety and depression   . Aortic arch atherosclerosis (HCC) 07/13/2018  . Exogenous obesity   . Fibromyalgia   . Hyperlipidemia   . Hypothyroidism   . Morbid obesity (HCC)   . OSA (obstructive sleep apnea)    uses CPAP  . Palpitations 07/13/2018  . Paroxysmal atrial flutter (HCC)   . Periodic limb movement disorder   . REM sleep behavior disorder   . Rhinitis   . SOB (shortness of breath)   . Spinal stenosis   . Unspecified hypothyroidism     Past Surgical History:  Procedure Laterality Date  . CARDIOVERSION N/A 04/29/2015   Procedure: CARDIOVERSION;  Surgeon: Yates Decamp, MD;  Location: [facility] ENDOSCOPY;  Service: Cardiovascular;  Laterality: N/A;  . CARPAL TUNNEL RELEASE     Bilateral  . DILATION AND CURETTAGE OF UTERUS    . left rotator cuff    . resection uterine polyp  2011    There were no vitals filed for this visit.  Subjective Assessment - 08/08/19 1334    Subjective  " The physician thinks that the problem is from the back, but I don't think so. I walked out of the last session with no pain; it came back on around 2 pm, but it wasn't strong. I've been sore today and was hurting after sitting in my car  for a long time." Patient reports that she can't sit or stand for a long time.    Patient Stated Goals  to decrease pain and know how control pain    Currently in Pain?  Yes    Pain Score  4    Patient was sitting in car for an extended period of time   Pain Location  Hip    Pain Orientation  Left    Pain Descriptors / Indicators  Aching;Sore    Pain Type  Chronic pain    Pain Radiating Towards  LLE referred pain down to the L knee and shin    Pain Onset  More than a month ago    Pain Frequency  Intermittent    Aggravating Factors   Standing or sitting for an extended period of time. stairs, sit-to-stand    Pain Relieving Factors  Resting                       [facility] Adult PT Treatment/Exercise - 08/08/19 0001      Lumbar Exercises: Stretches   Active Hamstring Stretch  3 reps;30 seconds;Left;Right   PNF contract/ relax  Lumbar Exercises: Aerobic   Nustep  L5 x 5 min UE/LE only      Lumbar Exercises: Seated   Sit to Stand  20 reps   avoiding back of knees on the table,     Lumbar Exercises: Supine   Bent Knee Raise  10 reps   alternating L/R x 10, double limb   Straight Leg Raise  20 reps    Other Supine Lumbar Exercises  bil hip flexion with bil heels on red physioball  2 x 20 , 2 x 20 with added adductor isometric   cues to keep core tight throughout exercise     Manual Therapy   Joint Mobilization  LAD grade V LLE only,                PT Short Term Goals - 08/01/19 1112      PT SHORT TERM GOAL #1   Title  pt to be I with inital HEp     Period  Weeks    Status  Achieved      PT SHORT TERM GOAL #2   Title  pt to demo/ verbalize proper posture during sitting/ standing and lifting and utliizes breaks appropriately to prevent/ reduce low back pain/ tightness    Period  Weeks    Status  Achieved        PT Long Term Goals - 08/06/19 1113      PT LONG TERM GOAL #1   Title  increase trunk flexion to >/= 75 degrees for functional mobility  required for ADLS    Baseline  52 degrees of  lumbar flexion    Time  6    Period  Weeks    Status  On-going    Target Date  08/16/19      PT LONG TERM GOAL #2   Title  pt to be able to stand and walk >/= 1 hour with no report of pain and navigate up/dwon >/= 12 steps with no report of pain for functional mobility    Time  6    Period  Weeks    Status  On-going    Target Date  08/16/19      PT LONG TERM GOAL #3   Title  pt to increase FOTO score to </= 41% limited to demo improvement in fucntion    Time  6    Period  Weeks    Status  On-going    Target Date  08/16/19      PT LONG TERM GOAL #4   Title  pt to return to personal exercise with no limtations per pt's personal goal to help reduce low back pain flares    Time  6    Period  Weeks    Status  On-going    Target Date  08/16/19      PT LONG TERM GOAL #5   Title  pt to be I with all HEp given as of last visit to maintain and progress current level of function    Time  6    Period  Weeks    Status  On-going    Target Date  08/16/19            Plan - 08/08/19 1422    Clinical Impression Statement  pt reports seeing her MD and notes they are suggesting potential injections for relief. initially focused session strictly on flexion biased to address bias regarding which she noted no changin pain or symptoms,  She responed well to hamstring stretch noting relief of pain in the hip followed with hip flexion strengthening. continued working hip strengthening focusing on hip extensors which she noted significant relief of pain with standing.    PT Treatment/Interventions  ADLs/Self Care Home Management;Cryotherapy;Electrical Stimulation;Moist Heat;Traction;Ultrasound;Gait training;Therapeutic exercise;Balance training;Neuromuscular re-education;Manual techniques;Patient/family education;Passive range of motion;Dry needling;Taping;Therapeutic activities;Iontophoresis 4mg /ml Dexamethasone;Stair training    PT Next Visit Plan   review/ update HEP PRN, response to TPDN for piriformis, glute max/med and L paraspinals. soft tissue mobilization, hip strengthening LAD, update HEP for 4-way hip. Ask patient if she finds heel lift to be beneficial    PT Home Exercise Plan  [id number] - piriformis stretch, single knee to chest, lower trunk rotation, clamshell, supine marching, hamstring stretch (supine/ seated), SLR, sit to stand    Consulted and Agree with Plan of Care  Patient       Patient will benefit from skilled therapeutic intervention in order to improve the following deficits and impairments:  Abnormal gait, Postural dysfunction, Increased muscle spasms, Decreased strength, Improper body mechanics, Pain, Decreased activity tolerance, Decreased endurance, Decreased balance, Decreased safety awareness, Decreased range of motion, Obesity  Visit Diagnosis: Chronic bilateral low back pain, unspecified whether sciatica present  Muscle weakness (generalized)  Abnormal posture  Other abnormalities of gait and mobility     Problem List Patient Active Problem List   Diagnosis Date Noted  . Palpitations 07/13/2018  . Laboratory examination 07/13/2018  . Aortic arch atherosclerosis (HCC) 07/13/2018  . Sleep talking 06/04/2015  . SOB (shortness of breath)   . Morbid obesity (HCC)   . Fibromyalgia   . Hyperlipidemia   . Hypothyroidism   . Paroxysmal atrial flutter (HCC)   . ANXIETY DEPRESSION 04/15/2007  . REM SLEEP BEHAVIOR DISORDER 04/15/2007  . UNSPECIFIED HYPOTHYROIDISM 04/14/2007  . EXOGENOUS OBESITY 02/10/2007  . OBSTRUCTIVE SLEEP APNEA 02/10/2007  . PERIODIC LIMB MOVEMENT DISORDER 02/10/2007  . RHINITIS 02/10/2007   04/12/2007 PT, DPT, LAT, ATC  08/08/19  2:28 PM      [facility] [address] [place], [place], [place] Phone: [phone]   Fax:  [phone]  Name: [name] [name] MRN: [name] Date of Birth: 1950-09-01

## 2019-08-21 ENCOUNTER — Ambulatory Visit: Payer: PPO | Attending: Internal Medicine

## 2019-08-21 DIAGNOSIS — Z23 Encounter for immunization: Secondary | ICD-10-CM

## 2019-08-21 NOTE — Progress Notes (Signed)
   Covid-19 Vaccination Clinic  Name:  [name] [name]    MRN: [id number] DOB: 1951/01/29  08/21/2019  Ms. Games was observed post Covid-19 immunization for 15 minutes without incident. She was provided with Vaccine Information Sheet and instruction to access the V-Safe system.   [name] was instructed to call 911 with any severe reactions post vaccine: Marland Kitchen Difficulty breathing  . Swelling of face and throat  . A fast heartbeat  . A bad rash all over body  . Dizziness and weakness   Immunizations Administered    Name Date Dose VIS Date Route   Pfizer COVID-19 Vaccine 08/21/2019 10:53 AM 0.3 mL 06/27/2018 Intramuscular   Manufacturer: [employer], [employer]   Lot: [id number]   NDC: [address]-1

## 2019-08-22 ENCOUNTER — Ambulatory Visit: Payer: PPO | Admitting: Physical Therapy

## 2019-08-24 ENCOUNTER — Other Ambulatory Visit: Payer: Self-pay

## 2019-08-24 ENCOUNTER — Encounter: Payer: Self-pay | Admitting: Physical Therapy

## 2019-08-24 ENCOUNTER — Ambulatory Visit: Payer: PPO | Admitting: Physical Therapy

## 2019-08-24 DIAGNOSIS — M6281 Muscle weakness (generalized): Secondary | ICD-10-CM

## 2019-08-24 DIAGNOSIS — G8929 Other chronic pain: Secondary | ICD-10-CM

## 2019-08-24 DIAGNOSIS — R2689 Other abnormalities of gait and mobility: Secondary | ICD-10-CM

## 2019-08-24 DIAGNOSIS — M545 Low back pain, unspecified: Secondary | ICD-10-CM

## 2019-08-24 DIAGNOSIS — R293 Abnormal posture: Secondary | ICD-10-CM

## 2019-08-24 NOTE — Therapy (Signed)
[facility] [address] [place], [place], [address] Phone: [phone]   Fax:  [phone]  Physical Therapy Treatment / Re-certification  Patient Details  Name: [name] [name] MRN: [id number] Date of Birth: 26-Aug-1950 Referring Provider (PT): Sharolyn Douglas MD   Encounter Date: 08/24/2019  PT End of Session - 08/24/19 1046    Visit Number  9    Number of Visits  15    Date for PT Re-Evaluation  10/05/19    Authorization Type  MCR: Kx mod at 15th visit    Progress Note Due on Visit  19    PT Start Time  1046    PT Stop Time  1130    PT Time Calculation (min)  44 min    Activity Tolerance  Patient tolerated treatment well    Behavior During Therapy  [facility] for tasks assessed/performed       Past Medical History:  Diagnosis Date  . Anxiety and depression   . Aortic arch atherosclerosis (HCC) 07/13/2018  . Exogenous obesity   . Fibromyalgia   . Hyperlipidemia   . Hypothyroidism   . Morbid obesity (HCC)   . OSA (obstructive sleep apnea)    uses CPAP  . Palpitations 07/13/2018  . Paroxysmal atrial flutter (HCC)   . Periodic limb movement disorder   . REM sleep behavior disorder   . Rhinitis   . SOB (shortness of breath)   . Spinal stenosis   . Unspecified hypothyroidism     Past Surgical History:  Procedure Laterality Date  . CARDIOVERSION N/A 04/29/2015   Procedure: CARDIOVERSION;  Surgeon: Yates Decamp, MD;  Location: [facility] ENDOSCOPY;  Service: Cardiovascular;  Laterality: N/A;  . CARPAL TUNNEL RELEASE     Bilateral  . DILATION AND CURETTAGE OF UTERUS    . left rotator cuff    . resection uterine polyp  2011    There were no vitals filed for this visit.  Subjective Assessment - 08/24/19 1048    Subjective  " I am sorry I missed the last session because my husband got into the cardiologist.    Patient Stated Goals  to decrease pain and know how control pain    Currently in Pain?  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    Referring Provider (PT)  Sharolyn Douglas MD      Observation/Other Assessments   Focus on Therapeutic Outcomes (FOTO)   47% limited      AROM   Lumbar Flexion  70    Lumbar Extension  20    Lumbar - Right Side Bend  20    Lumbar - Left Side Bend  20                   [facility] Adult PT Treatment/Exercise - 08/24/19 0001      Lumbar Exercises: Seated   Sit to Stand  20 reps   with blue band around the knees with glute set at top     Lumbar Exercises: Supine   Clam  20 reps   focusing LLE     Knee/Hip Exercises: Standing   Forward Step Up  2 sets;15 reps;Step Height: 6";Left    Functional Squat  2 sets;10 reps   with hands on sink  Manual Therapy   Joint Mobilization  LAD grade V LLE only,              PT Education - 08/24/19 1148    Education Details  Review/ updated HEP for step up and chair squats. benefits of continued strengthening. discussed POC moving foward.    Person(s) Educated  Patient    Methods  Explanation;Verbal cues;Handout    Comprehension  Verbalized understanding;Verbal cues required       PT Short Term Goals - 08/01/19 1112      PT SHORT TERM GOAL #1   Title  pt to be I with inital HEp     Period  Weeks    Status  Achieved      PT SHORT TERM GOAL #2   Title  pt to demo/ verbalize proper posture during sitting/ standing and lifting and utliizes breaks appropriately to prevent/ reduce low back pain/ tightness    Period  Weeks    Status  Achieved        PT Long Term Goals - 08/24/19 1052      PT LONG TERM GOAL #1   Title  increase trunk flexion to >/= 75 degrees for functional mobility required for ADLS    Period  Weeks    Status  On-going      PT LONG TERM GOAL #2   Title  pt to be able to stand and walk >/= 1 hour with no report of pain and navigate up/dwon >/= 12 steps with no report of pain for  functional mobility    Baseline  able to walk/ stand for an 1 hour,  and pain increases to 5/10    Period  Weeks    Status  On-going      PT LONG TERM GOAL #3   Title  pt to increase FOTO score to </= 41% limited to demo improvement in fucntion    Period  Weeks    Status  On-going      PT LONG TERM GOAL #4   Title  pt to return to personal exercise with no limtations per pt's personal goal to help reduce low back pain flares    Period  Weeks    Status  On-going      PT LONG TERM GOAL #5   Title  pt to be I with all HEp given as of last visit to maintain and progress current level of function    Period  Weeks    Status  On-going            Plan - 08/24/19 1144    Clinical Impression Statement  Mrs Araki reports intermittent consistency with HEP secondary to increased issues with her husband's care. She reports improvement in low back / L hip pain noting it occurs only intermittently with decreased severity/ irritablity. She is progressing appropriately toward her goals. Continued working on hip strengthening focusing on the bil LE posterior chain which she responded well to,end of session she reported no pain or irritation. she would benefit from continued physcial therapy to promote hip strength, reduce pain, maximize stability and work toward independent exercise.    PT Treatment/Interventions  ADLs/Self Care Home Management;Cryotherapy;Electrical Stimulation;Moist Heat;Traction;Ultrasound;Gait training;Therapeutic exercise;Balance training;Neuromuscular re-education;Manual techniques;Patient/family education;Passive range of motion;Dry needling;Taping;Therapeutic activities;Iontophoresis 4mg /ml Dexamethasone;Stair training    PT Next Visit Plan  review/ update HEP PRN, response to TPDN for piriformis, glute max/med and L paraspinals. soft tissue mobilization, hip strengthening LAD, update HEP for 4-way hip. Ask  patient if she finds heel lift to be beneficial    PT Home Exercise Plan   [id number] - piriformis stretch, single knee to chest, lower trunk rotation, clamshell, supine marching, hamstring stretch (supine/ seated), SLR, sit to stand    Consulted and Agree with Plan of Care  Patient       Patient will benefit from skilled therapeutic intervention in order to improve the following deficits and impairments:  Abnormal gait, Postural dysfunction, Increased muscle spasms, Decreased strength, Improper body mechanics, Pain, Decreased activity tolerance, Decreased endurance, Decreased balance, Decreased safety awareness, Decreased range of motion, Obesity  Visit Diagnosis: Chronic bilateral low back pain, unspecified whether sciatica present  Abnormal posture  Muscle weakness (generalized)  Other abnormalities of gait and mobility     Problem List Patient Active Problem List   Diagnosis Date Noted  . Palpitations 07/13/2018  . Laboratory examination 07/13/2018  . Aortic arch atherosclerosis ([place]) 07/13/2018  . Sleep talking 06/04/2015  . SOB (shortness of breath)   . Morbid obesity ([place])   . Fibromyalgia   . Hyperlipidemia   . Hypothyroidism   . Paroxysmal atrial flutter ([place])   . ANXIETY DEPRESSION 04/15/2007  . REM SLEEP BEHAVIOR DISORDER 04/15/2007  . UNSPECIFIED HYPOTHYROIDISM 04/14/2007  . EXOGENOUS OBESITY 02/10/2007  . OBSTRUCTIVE SLEEP APNEA 02/10/2007  . PERIODIC LIMB MOVEMENT DISORDER 02/10/2007  . RHINITIS 02/10/2007   Starr Lake PT, DPT, LAT, ATC  08/24/19  11:54 AM      [facility] [address] [place], [place], [address] Phone: [phone]   Fax:  [phone]  Name: MIRANDA FRESE MRN: [id number] Date of Birth: 09/01/1950

## 2019-08-27 ENCOUNTER — Other Ambulatory Visit: Payer: Self-pay

## 2019-08-27 ENCOUNTER — Encounter: Payer: Self-pay | Admitting: Physical Therapy

## 2019-08-27 ENCOUNTER — Ambulatory Visit: Payer: PPO | Admitting: Physical Therapy

## 2019-08-27 DIAGNOSIS — M545 Low back pain: Secondary | ICD-10-CM | POA: Diagnosis not present

## 2019-08-27 DIAGNOSIS — R293 Abnormal posture: Secondary | ICD-10-CM

## 2019-08-27 DIAGNOSIS — G8929 Other chronic pain: Secondary | ICD-10-CM

## 2019-08-27 DIAGNOSIS — M6281 Muscle weakness (generalized): Secondary | ICD-10-CM

## 2019-08-27 DIAGNOSIS — R2689 Other abnormalities of gait and mobility: Secondary | ICD-10-CM

## 2019-08-27 NOTE — Therapy (Signed)
[facility] [address] [place], [place], [address] Phone: [phone]   Fax:  [phone]  Physical Therapy Treatment  Patient Details  Name: [name] [name] MRN: [id number] Date of Birth: 1950/05/08 Referring Provider (PT): Sharolyn Douglas MD   Encounter Date: 08/27/2019  PT End of Session - 08/27/19 1057    Visit Number  10    Number of Visits  15    Date for PT Re-Evaluation  10/05/19    Authorization Type  MCR: Kx mod at 15th visit    Progress Note Due on Visit  19    PT Start Time  1059    PT Stop Time  1141    PT Time Calculation (min)  42 min    Activity Tolerance  Patient tolerated treatment well    Behavior During Therapy  [facility] for tasks assessed/performed       Past Medical History:  Diagnosis Date  . Anxiety and depression   . Aortic arch atherosclerosis (HCC) 07/13/2018  . Exogenous obesity   . Fibromyalgia   . Hyperlipidemia   . Hypothyroidism   . Morbid obesity (HCC)   . OSA (obstructive sleep apnea)    uses CPAP  . Palpitations 07/13/2018  . Paroxysmal atrial flutter (HCC)   . Periodic limb movement disorder   . REM sleep behavior disorder   . Rhinitis   . SOB (shortness of breath)   . Spinal stenosis   . Unspecified hypothyroidism     Past Surgical History:  Procedure Laterality Date  . CARDIOVERSION N/A 04/29/2015   Procedure: CARDIOVERSION;  Surgeon: Yates Decamp, MD;  Location: [facility] END[facility];  Service: Cardiovascular;  Laterality: N/A;  . CARPAL TUNNEL RELEASE     Bilateral  . DILATION AND CURETTAGE OF UTERUS    . left rotator cuff    . resection uterine polyp  2011    There were no vitals filed for this visit.  Subjective Assessment - 08/27/19 1058    Subjective  "I felt pretty good after the last session, until Saturday morning and I was very sore. I didn't do any of my exercises on Saturday, but I felt good on Sunday and I am doing pretty good"    Patient Stated Goals  to decrease pain and know  how control pain    Currently in Pain?  Yes    Pain Score  0-No pain    Pain Orientation  Left    Pain Descriptors / Indicators  Aching    Pain Type  Chronic pain    Pain Onset  More than a month ago    Pain Frequency  Intermittent    Aggravating Factors   intermittenly stairs, stiffness getting out in the morning.    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    Referring Provider (PT)  Sharolyn Douglas MD                   [facility] Adult PT Treatment/Exercise - 08/27/19 0001      Lumbar Exercises: Machines for Strengthening   Leg Press  2 x 20 LLE only 40#      Lumbar Exercises: Supine   Bent Knee Raise  20 reps    Dead Bug  5 reps   10 sec   Bridge with clamshell  20 reps  with green band   Straight Leg Raise  20 reps   LLE     Knee/Hip Exercises: Standing   Forward Step Up  2 sets;15 reps;Step Height: 6";Left    Step Down  2 sets;10 reps;Step Height: 8"    Functional Squat  1 set;10 reps    Stairs  performed initally up/down 6 inch step x 2, able to perform steps following treatment with improvement with climbing steps x 6               PT Short Term Goals - 08/01/19 1112      PT SHORT TERM GOAL #1   Title  pt to be I with inital HEp     Period  Weeks    Status  Achieved      PT SHORT TERM GOAL #2   Title  pt to demo/ verbalize proper posture during sitting/ standing and lifting and utliizes breaks appropriately to prevent/ reduce low back pain/ tightness    Period  Weeks    Status  Achieved        PT Long Term Goals - 08/24/19 1052      PT LONG TERM GOAL #1   Title  increase trunk flexion to >/= 75 degrees for functional mobility required for ADLS    Period  Weeks    Status  On-going      PT LONG TERM GOAL #2   Title  pt to be able to stand and walk >/= 1 hour with no report of pain and navigate up/dwon >/= 12 steps with no report  of pain for functional mobility    Baseline  able to walk/ stand for an 1 hour,  and pain increases to 5/10    Period  Weeks    Status  On-going      PT LONG TERM GOAL #3   Title  pt to increase FOTO score to </= 41% limited to demo improvement in fucntion    Period  Weeks    Status  On-going      PT LONG TERM GOAL #4   Title  pt to return to personal exercise with no limtations per pt's personal goal to help reduce low back pain flares    Period  Weeks    Status  On-going      PT LONG TERM GOAL #5   Title  pt to be I with all HEp given as of last visit to maintain and progress current level of function    Period  Weeks    Status  On-going            Plan - 08/27/19 1130    Clinical Impression Statement  pt reports DOMS following last session lasting for a day. focused session on hip strengthening with emphasis on hip extensor/ flexor and hamstring stretching which she continues to report improvement of pain especially with stair training.    PT Treatment/Interventions  ADLs/Self Care Home Management;Cryotherapy;Electrical Stimulation;Moist Heat;Traction;Ultrasound;Gait training;Therapeutic exercise;Balance training;Neuromuscular re-education;Manual techniques;Patient/family education;Passive range of motion;Dry needling;Taping;Therapeutic activities;Iontophoresis 4mg /ml Dexamethasone;Stair training    PT Next Visit Plan  review/ update HEP PRN, response to TPDN for piriformis, glute max/med and L paraspinals. soft tissue mobilization, hip strengthening LAD, update HEP for 4-way hip.    PT Home Exercise Plan  [id number] - piriformis stretch, single knee to chest, lower trunk rotation, clamshell, supine marching, hamstring stretch (supine/ seated), SLR, sit to stand    Consulted and Agree with Plan of Care  Patient       Patient will benefit from skilled therapeutic intervention in order to improve the following deficits and impairments:  Abnormal gait, Postural dysfunction,  Increased muscle spasms, Decreased strength, Improper body mechanics, Pain, Decreased activity tolerance, Decreased endurance, Decreased balance, Decreased safety awareness, Decreased range of motion, Obesity  Visit Diagnosis: Chronic bilateral low back pain, unspecified whether sciatica present  Abnormal posture  Muscle weakness (generalized)  Other abnormalities of gait and mobility     Problem List Patient Active Problem List   Diagnosis Date Noted  . Palpitations 07/13/2018  . Laboratory examination 07/13/2018  . Aortic arch atherosclerosis ([place]) 07/13/2018  . Sleep talking 06/04/2015  . SOB (shortness of breath)   . Morbid obesity ([place])   . Fibromyalgia   . Hyperlipidemia   . Hypothyroidism   . Paroxysmal atrial flutter ([place])   . ANXIETY DEPRESSION 04/15/2007  . REM SLEEP BEHAVIOR DISORDER 04/15/2007  . UNSPECIFIED HYPOTHYROIDISM 04/14/2007  . EXOGENOUS OBESITY 02/10/2007  . OBSTRUCTIVE SLEEP APNEA 02/10/2007  . PERIODIC LIMB MOVEMENT DISORDER 02/10/2007  . RHINITIS 02/10/2007   Starr Lake PT, DPT, LAT, ATC  08/27/19  11:42 AM      [facility] [address] [place], [place], [address] Phone: [phone]   Fax:  [phone]  Name: [name] [name] MRN: [id number] Date of Birth: [birth year]

## 2019-08-29 ENCOUNTER — Encounter: Payer: Self-pay | Admitting: Physical Therapy

## 2019-08-29 ENCOUNTER — Ambulatory Visit: Payer: PPO | Admitting: Physical Therapy

## 2019-08-29 ENCOUNTER — Other Ambulatory Visit: Payer: Self-pay

## 2019-08-29 DIAGNOSIS — G8929 Other chronic pain: Secondary | ICD-10-CM

## 2019-08-29 DIAGNOSIS — M6281 Muscle weakness (generalized): Secondary | ICD-10-CM

## 2019-08-29 DIAGNOSIS — R2689 Other abnormalities of gait and mobility: Secondary | ICD-10-CM

## 2019-08-29 DIAGNOSIS — M545 Low back pain: Secondary | ICD-10-CM | POA: Diagnosis not present

## 2019-08-29 DIAGNOSIS — R293 Abnormal posture: Secondary | ICD-10-CM

## 2019-08-29 NOTE — Therapy (Signed)
[facility] [address] [place], [place], [address] Phone: [phone]   Fax:  [phone]  Physical Therapy Treatment  Patient Details  Name: [name] [name] MRN: [id number] Date of Birth: 15-Jun-1950 Referring Provider (PT): Sharolyn Douglas MD   Encounter Date: 08/29/2019  PT End of Session - 08/29/19 1417    Visit Number  11    Number of Visits  15    Date for PT Re-Evaluation  10/05/19    Authorization Type  MCR: Kx mod at 15th visit    Progress Note Due on Visit  19    PT Start Time  1417    PT Stop Time  1458    PT Time Calculation (min)  41 min    Activity Tolerance  Patient tolerated treatment well    Behavior During Therapy  [facility] for tasks assessed/performed       Past Medical History:  Diagnosis Date  . Anxiety and depression   . Aortic arch atherosclerosis (HCC) 07/13/2018  . Exogenous obesity   . Fibromyalgia   . Hyperlipidemia   . Hypothyroidism   . Morbid obesity (HCC)   . OSA (obstructive sleep apnea)    uses CPAP  . Palpitations 07/13/2018  . Paroxysmal atrial flutter (HCC)   . Periodic limb movement disorder   . REM sleep behavior disorder   . Rhinitis   . SOB (shortness of breath)   . Spinal stenosis   . Unspecified hypothyroidism     Past Surgical History:  Procedure Laterality Date  . CARDIOVERSION N/A 04/29/2015   Procedure: CARDIOVERSION;  Surgeon: Yates Decamp, MD;  Location: [facility] ENDOSCOPY;  Service: Cardiovascular;  Laterality: N/A;  . CARPAL TUNNEL RELEASE     Bilateral  . DILATION AND CURETTAGE OF UTERUS    . left rotator cuff    . resection uterine polyp  2011    There were no vitals filed for this visit.  Subjective Assessment - 08/29/19 1419    Subjective  "I've been sitting in the car for the an hour.  I still felt sore after the last session, and for a day or two. I have been working on going up the stairs step over step"    Currently in Pain?  Yes    Pain Score  0-No pain    Pain  Location  Hip    Pain Orientation  Left    Pain Type  Chronic pain    Pain Onset  More than a month ago    Pain Frequency  Intermittent                       [facility] Adult PT Treatment/Exercise - 08/29/19 0001      Lumbar Exercises: Aerobic   Nustep  L5 x 5 min LE only      Lumbar Exercises: Standing   Other Standing Lumbar Exercises  repeated trunk extension 2 x 15    avoiding extreme end range     Knee/Hip Exercises: Standing   Functional Squat  1 set;10 reps    Stairs  6 inch stairs 5 x 6 inch steps reciprocally ( no pain following repeated trunk extension)   following heel lift adjustment and manual techniques     Manual Therapy   Manual Therapy  Other (comment)    Joint Mobilization  LAD grade V LLE only, L sacral ala PA mobs grade III    Soft tissue mobilization  MTPR r piriformis x  4    Other Manual Therapy  piriformis tack and stretch on the RLE             PT Education - 08/29/19 1731    Education Details  updated HEP for standing repeated trunk extension, avoiding extreme end range 2 x 15    Person(s) Educated  Patient    Methods  Explanation;Verbal cues    Comprehension  Verbalized understanding;Verbal cues required       PT Short Term Goals - 08/01/19 1112      PT SHORT TERM GOAL #1   Title  pt to be I with inital HEp     Period  Weeks    Status  Achieved      PT SHORT TERM GOAL #2   Title  pt to demo/ verbalize proper posture during sitting/ standing and lifting and utliizes breaks appropriately to prevent/ reduce low back pain/ tightness    Period  Weeks    Status  Achieved        PT Long Term Goals - 08/24/19 1052      PT LONG TERM GOAL #1   Title  increase trunk flexion to >/= 75 degrees for functional mobility required for ADLS    Period  Weeks    Status  On-going      PT LONG TERM GOAL #2   Title  pt to be able to stand and walk >/= 1 hour with no report of pain and navigate up/dwon >/= 12 steps with no report of pain  for functional mobility    Baseline  able to walk/ stand for an 1 hour,  and pain increases to 5/10    Period  Weeks    Status  On-going      PT LONG TERM GOAL #3   Title  pt to increase FOTO score to </= 41% limited to demo improvement in fucntion    Period  Weeks    Status  On-going      PT LONG TERM GOAL #4   Title  pt to return to personal exercise with no limtations per pt's personal goal to help reduce low back pain flares    Period  Weeks    Status  On-going      PT LONG TERM GOAL #5   Title  pt to be I with all HEp given as of last visit to maintain and progress current level of function    Period  Weeks    Status  On-going            Plan - 08/29/19 1732    Clinical Impression Statement  focused session on manual techniques to address L hip pain with ascending stairs. to address potential sacral torsion with potential R on R sacral torsion with trigger point release along R piriformis and L sacral ala PA mobs. She noted soem relief of pain with nagivating steps but noted singificant relief following repeated standing trunk extension with no pain when leading with the LLE.    PT Next Visit Plan  review/ update HEP PRN, response to TPDN for piriformis, glute max/med and L paraspinals. soft tissue mobilization, hip strengthening LAD, repeated trunk extension? update HEP for 4-way hip. STW along R piriformis    PT Home Exercise Plan  [id number] - piriformis stretch, single knee to chest, lower trunk rotation, clamshell, supine marching, hamstring stretch (supine/ seated), SLR, sit to stand    Consulted and Agree with Plan of Care  Patient  Patient will benefit from skilled therapeutic intervention in order to improve the following deficits and impairments:  Abnormal gait, Postural dysfunction, Increased muscle spasms, Decreased strength, Improper body mechanics, Pain, Decreased activity tolerance, Decreased endurance, Decreased balance, Decreased safety awareness, Decreased  range of motion, Obesity  Visit Diagnosis: Chronic bilateral low back pain, unspecified whether sciatica present  Abnormal posture  Muscle weakness (generalized)  Other abnormalities of gait and mobility     Problem List Patient Active Problem List   Diagnosis Date Noted  . Palpitations 07/13/2018  . Laboratory examination 07/13/2018  . Aortic arch atherosclerosis ([place]) 07/13/2018  . Sleep talking 06/04/2015  . SOB (shortness of breath)   . Morbid obesity ([place])   . Fibromyalgia   . Hyperlipidemia   . Hypothyroidism   . Paroxysmal atrial flutter ([place])   . ANXIETY DEPRESSION 04/15/2007  . REM SLEEP BEHAVIOR DISORDER 04/15/2007  . UNSPECIFIED HYPOTHYROIDISM 04/14/2007  . EXOGENOUS OBESITY 02/10/2007  . OBSTRUCTIVE SLEEP APNEA 02/10/2007  . PERIODIC LIMB MOVEMENT DISORDER 02/10/2007  . RHINITIS 02/10/2007   Starr Lake PT, DPT, LAT, ATC  08/29/19  5:37 PM      [facility] [address] [place], [place], [address] Phone: [phone]   Fax:  [phone]  Name: [name] [name] MRN: [id number] Date of Birth: 1950/12/28

## 2019-09-04 ENCOUNTER — Encounter: Payer: PPO | Admitting: Physical Therapy

## 2019-09-06 DIAGNOSIS — I480 Paroxysmal atrial fibrillation: Secondary | ICD-10-CM | POA: Diagnosis not present

## 2019-09-06 DIAGNOSIS — M797 Fibromyalgia: Secondary | ICD-10-CM | POA: Diagnosis not present

## 2019-09-06 DIAGNOSIS — R531 Weakness: Secondary | ICD-10-CM | POA: Diagnosis not present

## 2019-09-11 ENCOUNTER — Other Ambulatory Visit: Payer: Self-pay

## 2019-09-11 ENCOUNTER — Ambulatory Visit: Payer: PPO | Attending: Orthopaedic Surgery | Admitting: Physical Therapy

## 2019-09-11 ENCOUNTER — Encounter: Payer: Self-pay | Admitting: Physical Therapy

## 2019-09-11 DIAGNOSIS — R2689 Other abnormalities of gait and mobility: Secondary | ICD-10-CM | POA: Insufficient documentation

## 2019-09-11 DIAGNOSIS — M6281 Muscle weakness (generalized): Secondary | ICD-10-CM | POA: Insufficient documentation

## 2019-09-11 DIAGNOSIS — M545 Low back pain, unspecified: Secondary | ICD-10-CM

## 2019-09-11 DIAGNOSIS — R293 Abnormal posture: Secondary | ICD-10-CM | POA: Insufficient documentation

## 2019-09-11 DIAGNOSIS — G8929 Other chronic pain: Secondary | ICD-10-CM | POA: Diagnosis not present

## 2019-09-11 NOTE — Therapy (Signed)
[place], [place], [address] Phone: [phone]   Fax:  [phone]  Physical Therapy Treatment  Patient Details  Name: [name] [name] MRN: [id number] Date of Birth: [birth year] Referring Provider (PT): Rennis Harding MD   Encounter Date: 09/11/2019  PT End of Session - 09/11/19 1109    Visit Number  12    Number of Visits  15    Date for PT Re-Evaluation  10/05/19    Authorization Type  MCR: Kx mod at 15th visit    Progress Note Due on Visit  19    PT Start Time  1101    PT Stop Time  1144    PT Time Calculation (min)  43 min    Activity Tolerance  Patient tolerated treatment well    Behavior During Therapy  [facility] for tasks assessed/performed       Past Medical History:  Diagnosis Date  . Anxiety and depression   . Aortic arch atherosclerosis ([place]) 07/13/2018  . Exogenous obesity   . Fibromyalgia   . Hyperlipidemia   . Hypothyroidism   . Morbid obesity ([place])   . OSA (obstructive sleep apnea)    uses CPAP  . Palpitations 07/13/2018  . Paroxysmal atrial flutter ([place])   . Periodic limb movement disorder   . REM sleep behavior disorder   . Rhinitis   . SOB (shortness of breath)   . Spinal stenosis   . Unspecified hypothyroidism     Past Surgical History:  Procedure Laterality Date  . CARDIOVERSION N/A 04/29/2015   Procedure: CARDIOVERSION;  Surgeon: Adrian Prows, MD;  Location: [place];  Service: Cardiovascular;  Laterality: N/A;  . CARPAL TUNNEL RELEASE     Bilateral  . DILATION AND CURETTAGE OF UTERUS    . left rotator cuff    . resection uterine polyp  2011    There were no vitals filed for this visit.  Subjective Assessment - 09/11/19 1107    Subjective  "I am doing much better especially after the last session. I've been doing alot around the house but i felt like I am doing more"    Currently in Pain?  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    Referring Provider (PT)  Rennis Harding MD                   [facility] Adult PT Treatment/Exercise - 09/11/19 0001      Lumbar Exercises: Aerobic   Nustep  L7 x 6 min LE only      Lumbar Exercises: Machines for Strengthening   Leg Press  2 x 25 at 60#      Lumbar Exercises: Standing   Other Standing Lumbar Exercises  repeated trunk extension 2 x 20      Lumbar Exercises: Seated   Sit to Stand  20 reps      Knee/Hip Exercises: Standing   Hip Abduction  2 sets;15 reps;Knee straight    Hip Extension  2 sets;15 reps;Knee straight    Forward Step Up  2 sets;10 reps;Step Height:  8"      Knee/Hip Exercises: Seated   Other Seated Knee/Hip Exercises  hip ER RLE only 2 x 15 with green theraband               PT Short Term Goals - 08/01/19 1112      PT SHORT TERM GOAL #1   Title  pt to be I with inital HEp     Period  Weeks    Status  Achieved      PT SHORT TERM GOAL #2   Title  pt to demo/ verbalize proper posture during sitting/ standing and lifting and utliizes breaks appropriately to prevent/ reduce low back pain/ tightness    Period  Weeks    Status  Achieved        PT Long Term Goals - 08/24/19 1052      PT LONG TERM GOAL #1   Title  increase trunk flexion to >/= 75 degrees for functional mobility required for ADLS    Period  Weeks    Status  On-going      PT LONG TERM GOAL #2   Title  pt to be able to stand and walk >/= 1 hour with no report of pain and navigate up/dwon >/= 12 steps with no report of pain for functional mobility    Baseline  able to walk/ stand for an 1 hour,  and pain increases to 5/10    Period  Weeks    Status  On-going      PT LONG TERM GOAL #3   Title  pt to increase FOTO score to </=  41% limited to demo improvement in fucntion    Period  Weeks    Status  On-going      PT LONG TERM GOAL #4   Title  pt to return to personal exercise with no limtations per pt's personal goal to help reduce low back pain flares    Period  Weeks    Status  On-going      PT LONG TERM GOAL #5   Title  pt to be I with all HEp given as of last visit to maintain and progress current level of function    Period  Weeks    Status  On-going            Plan - 09/11/19 1110    Clinical Impression Statement  Mrs [name] [name] reports significant improvement in pain since the last session. she also notes she has been able to do more with walking/ standing with decreased pain/ stiffness. focused session on hip/ core strengthening with emphasis on endurance training which she tolerated very well. discussed with pt if she is doing better next session and doing well then plan to potentially discharge next session.    PT Treatment/Interventions  ADLs/Self Care Home Management;Cryotherapy;Electrical Stimulation;Moist Heat;Traction;Ultrasound;Gait training;Therapeutic exercise;Balance training;Neuromuscular re-education;Manual techniques;Patient/family education;Passive range of motion;Dry needling;Taping;Therapeutic activities;Iontophoresis 4mg /ml Dexamethasone;Stair training    PT Next Visit Plan  review/ update HEP PRN, response to TPDN for piriformis, glute max/med and L paraspinals. soft tissue mobilization, hip strengthening LAD, repeated trunk extension? update HEP for 4-way hip. STW along R piriformis    PT Home Exercise Plan  [id number] - piriformis stretch, single knee to chest, lower trunk rotation, clamshell, supine marching, hamstring stretch (supine/ seated), SLR, sit to stand    Consulted and Agree with Plan of Care  Patient       Patient will benefit from skilled therapeutic intervention in order to  improve the following deficits and impairments:  Abnormal gait, Postural dysfunction, Increased muscle  spasms, Decreased strength, Improper body mechanics, Pain, Decreased activity tolerance, Decreased endurance, Decreased balance, Decreased safety awareness, Decreased range of motion, Obesity  Visit Diagnosis: Chronic bilateral low back pain, unspecified whether sciatica present  Abnormal posture  Muscle weakness (generalized)  Other abnormalities of gait and mobility     Problem List Patient Active Problem List   Diagnosis Date Noted  . Palpitations 07/13/2018  . Laboratory examination 07/13/2018  . Aortic arch atherosclerosis (HCC) 07/13/2018  . Sleep talking 06/04/2015  . SOB (shortness of breath)   . Morbid obesity (HCC)   . Fibromyalgia   . Hyperlipidemia   . Hypothyroidism   . Paroxysmal atrial flutter (HCC)   . ANXIETY DEPRESSION 04/15/2007  . REM SLEEP BEHAVIOR DISORDER 04/15/2007  . UNSPECIFIED HYPOTHYROIDISM 04/14/2007  . EXOGENOUS OBESITY 02/10/2007  . OBSTRUCTIVE SLEEP APNEA 02/10/2007  . PERIODIC LIMB MOVEMENT DISORDER 02/10/2007  . RHINITIS 02/10/2007    Lulu Riding PT, DPT, LAT, ATC  09/11/19  12:05 PM      [facility] [address] [place], [place], [address] Phone: [phone]   Fax:  [phone]  Name: [name] [name] MRN: [id number] Date of Birth: Oct 18, 1950

## 2019-09-14 ENCOUNTER — Ambulatory Visit: Payer: PPO | Admitting: Physical Therapy

## 2019-09-14 ENCOUNTER — Other Ambulatory Visit: Payer: Self-pay

## 2019-09-14 DIAGNOSIS — R2689 Other abnormalities of gait and mobility: Secondary | ICD-10-CM

## 2019-09-14 DIAGNOSIS — M545 Low back pain: Secondary | ICD-10-CM | POA: Diagnosis not present

## 2019-09-14 DIAGNOSIS — R293 Abnormal posture: Secondary | ICD-10-CM

## 2019-09-14 DIAGNOSIS — M6281 Muscle weakness (generalized): Secondary | ICD-10-CM

## 2019-09-14 DIAGNOSIS — G8929 Other chronic pain: Secondary | ICD-10-CM

## 2019-09-14 NOTE — Therapy (Signed)
[place], [place], [address] Phone: [phone]   Fax:  [phone]  Physical Therapy Treatment / discharge  Patient Details  Name: [name] [name] MRN: [id number] Date of Birth: 06/22/1950 Referring Provider (PT): Rennis Harding MD   Encounter Date: 09/14/2019  PT End of Session - 09/14/19 1052    Visit Number  13    Number of Visits  15    Date for PT Re-Evaluation  10/05/19    Authorization Type  MCR: Kx mod at 15th visit    Progress Note Due on Visit  19    PT Start Time  1045    PT Stop Time  1130    PT Time Calculation (min)  45 min    Activity Tolerance  Patient tolerated treatment well    Behavior During Therapy  [facility] for tasks assessed/performed       Past Medical History:  Diagnosis Date  . Anxiety and depression   . Aortic arch atherosclerosis ([place]) 07/13/2018  . Exogenous obesity   . Fibromyalgia   . Hyperlipidemia   . Hypothyroidism   . Morbid obesity ([place])   . OSA (obstructive sleep apnea)    uses CPAP  . Palpitations 07/13/2018  . Paroxysmal atrial flutter ([place])   . Periodic limb movement disorder   . REM sleep behavior disorder   . Rhinitis   . SOB (shortness of breath)   . Spinal stenosis   . Unspecified hypothyroidism     Past Surgical History:  Procedure Laterality Date  . CARDIOVERSION N/A 04/29/2015   Procedure: CARDIOVERSION;  Surgeon: Adrian Prows, MD;  Location: [place];  Service: Cardiovascular;  Laterality: N/A;  . CARPAL TUNNEL RELEASE     Bilateral  . DILATION AND CURETTAGE OF UTERUS    . left rotator cuff    . resection uterine polyp  2011    There were no vitals filed for this visit.  Subjective Assessment - 09/14/19 1054    Subjective  "I only have a small about of soreness, but I really am feeling good"    Currently in Pain?  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 discussed how to progress strnegthening with increased reps/ sets and resistance at appropriate intervals. Reviewed ROM and goals compared to inital  measures as well as FOTO re-assessment. discussed at length work out regiments with walking/ and aquatic exercise and how to progress to avoid over exertion.    Person(s) Educated  Patient    Methods  Explanation;Verbal cues    Comprehension  Verbalized understanding;Verbal cues required       PT Short Term Goals - 08/01/19 1112      PT SHORT TERM GOAL #1   Title  pt to be I with inital HEp     Period  Weeks    Status  Achieved      PT SHORT TERM GOAL #2   Title  pt to demo/ verbalize proper posture during sitting/ standing and lifting and utliizes breaks appropriately to prevent/ reduce low back pain/ tightness    Period  Weeks    Status  Achieved        PT Long Term Goals - 09/14/19 1107      PT LONG TERM GOAL #1   Title  increase trunk flexion to >/= 75 degrees for functional mobility required for ADLS    Period  Weeks    Status  Achieved      PT LONG TERM GOAL #2   Title  pt to be able to stand and walk >/= 1 hour with no report of pain and navigate up/dwon >/= 12 steps with no report of pain for functional mobility    Baseline  able to walk/  standing 1 hour with no pain, reported intermittent soreness in the hip with stairs at aroudn 1/10 today    Status  Partially Met      PT LONG TERM GOAL #3   Title  pt to increase FOTO score to </= 41% limited to demo improvement in fucntion    Period  Weeks    Status  Achieved      PT LONG TERM GOAL #4   Title  pt to return to personal exercise with no limtations per pt's personal goal to help reduce low back pain flares    Period  Weeks    Status  Achieved      PT LONG TERM GOAL #5   Title  pt to be I with all HEp given as of last visit to maintain and progress current level of function    Period  Weeks    Status  Achieved            Plan - 09/14/19 1137    Clinical Impression Statement  [name] [name] has made great progress with physicla therpay improving trunk ROM, increasing enddurnace and additionally reports no pain at rest. She does note mild soreness with navigating steps which is around 1/10 or less and is inconsistent. She met or partially met all goals today and is able to maintain and progress her current level of function and will be discharged from PT today.    PT Treatment/Interventions  ADLs/Self Care Home Management;Cryotherapy;Electrical Stimulation;Moist Heat;Traction;Ultrasound;Gait training;Therapeutic exercise;Balance training;Neuromuscular re-education;Manual techniques;Patient/family education;Passive range of motion;Dry needling;Taping;Therapeutic activities;Iontophoresis '4mg'$ /ml Dexamethasone;Stair training    PT Next Visit Plan  review/ update HEP PRN, response to TPDN for piriformis, glute max/med and L paraspinals. soft tissue mobilization, hip strengthening LAD, repeated trunk extension? update HEP for 4-way hip. STW along R piriformis    PT Home Exercise Plan  [id number] - piriformis stretch, single knee to chest, lower trunk rotation, clamshell, supine marching, hamstring stretch (supine/ seated), SLR, sit to stand    Consulted and Agree with Plan of Care  Patient       Patient will benefit from skilled therapeutic intervention in order to improve the following deficits and impairments:  Abnormal gait, Postural dysfunction, Increased muscle spasms, Decreased strength, Improper body mechanics, Pain, Decreased activity tolerance, Decreased endurance, Decreased balance, Decreased safety awareness, Decreased range of motion, Obesity  Visit Diagnosis: Chronic bilateral low back pain, unspecified whether sciatica present  Abnormal posture  Muscle weakness (generalized)  Other abnormalities of gait and mobility     Problem List Patient Active Problem List   Diagnosis Date Noted  . Palpitations 07/13/2018  . Laboratory examination 07/13/2018  . Aortic arch atherosclerosis ([place]) 07/13/2018  . Sleep talking 06/04/2015  . SOB (shortness of breath)   . Morbid obesity ([place])   . Fibromyalgia   . Hyperlipidemia   . Hypothyroidism   . Paroxysmal atrial flutter ([place])   . ANXIETY DEPRESSION 04/15/2007  . REM SLEEP BEHAVIOR DISORDER 04/15/2007  . UNSPECIFIED HYPOTHYROIDISM 04/14/2007  . EXOGENOUS OBESITY 02/10/2007  . OBSTRUCTIVE SLEEP APNEA 02/10/2007  . PERIODIC LIMB MOVEMENT DISORDER 02/10/2007  . RHINITIS 02/10/2007    Starr Lake 09/14/2019, 11:39 AM  [facility] [address] [place], [place], [address] Phone: [phone]   Fax:  [phone]  Name: [name] [name] MRN: [id number] Date of Birth: [birth year]    PHYSICAL THERAPY DISCHARGE SUMMARY  Visits from Start of Care: 13  Current functional level related to goals / functional outcomes: See goals, FOTO 35% limited   Remaining deficits: See assessment above   Education / Equipment: HEP, theraband, posture, lifting mechanics, appropriate exercise progression.   Plan: Patient agrees to discharge.  Patient goals were partially met. Patient is being discharged due to being pleased with the current functional level.   ?????         Miyanna Wiersma PT, DPT, LAT, ATC  09/14/19  11:40 AM

## 2019-10-10 DIAGNOSIS — R309 Painful micturition, unspecified: Secondary | ICD-10-CM | POA: Diagnosis not present

## 2019-10-10 DIAGNOSIS — N390 Urinary tract infection, site not specified: Secondary | ICD-10-CM | POA: Diagnosis not present

## 2019-10-15 ENCOUNTER — Telehealth: Payer: Self-pay

## 2019-10-15 NOTE — Telephone Encounter (Signed)
Patient called and said that she has been having dizzy spells while out shopping and walking. Her BP was 115/52, she wondered if she was on to much BP meds, please advise?

## 2019-10-15 NOTE — Telephone Encounter (Signed)
She is on minimal dose. May be dehydration

## 2019-10-16 ENCOUNTER — Other Ambulatory Visit: Payer: Self-pay

## 2019-10-17 DIAGNOSIS — N8189 Other female genital prolapse: Secondary | ICD-10-CM | POA: Diagnosis not present

## 2019-10-17 DIAGNOSIS — N8110 Cystocele, unspecified: Secondary | ICD-10-CM | POA: Diagnosis not present

## 2020-01-07 ENCOUNTER — Other Ambulatory Visit: Payer: Self-pay | Admitting: Cardiology

## 2020-01-14 DIAGNOSIS — J0100 Acute maxillary sinusitis, unspecified: Secondary | ICD-10-CM | POA: Diagnosis not present

## 2020-01-24 DIAGNOSIS — J029 Acute pharyngitis, unspecified: Secondary | ICD-10-CM | POA: Diagnosis not present

## 2020-01-24 DIAGNOSIS — J019 Acute sinusitis, unspecified: Secondary | ICD-10-CM | POA: Diagnosis not present

## 2020-02-04 DIAGNOSIS — R309 Painful micturition, unspecified: Secondary | ICD-10-CM | POA: Diagnosis not present

## 2020-02-04 DIAGNOSIS — N8110 Cystocele, unspecified: Secondary | ICD-10-CM | POA: Diagnosis not present

## 2020-02-04 DIAGNOSIS — N390 Urinary tract infection, site not specified: Secondary | ICD-10-CM | POA: Diagnosis not present

## 2020-02-13 NOTE — Progress Notes (Signed)
Primary Physician:  Burnard Bunting, MD  Patient ID: [name] [name], female    DOB: Sep 10, 1950, 69 y.o.   MRN: [id number]  Subjective:   Chief Complaint  Patient presents with  . Dizziness  . Follow-up   HPI: [name] [name]  is a 69 y.o. female  with paroxysmal atypical atrial flutter, hyperlipidemia, morbid obesity and hypertension. She was found to be in atrial flutter with rapid ventricular response on 03/13/2015. It was felt to be atypical atrial flutter and was evaluated by Dr. [name], recommended cardioversion, successful direct current cardioversion on 04/29/2015 with no recurrence.  However today (02/14/2020) patient presents in atrial fibrillation with rapid ventricular response.  Patient presents for 56-monthfollow-up with complaint of dizziness. At last visit blood pressure was not at goal, so olmesartan was increased from 5 to 156mdaily. Patient reports that on 02/11/2020 she experienced 2 episodes of dizziness associated with palpitations lasting less than a minute.  Denies chest pain, shortness of breath, syncope, orthopnea, PND.  Notes that since these episodes she has experienced increased fatigue and mild dyspnea on exertion.  Of note she has recently started low calorie diet, she has lost 9 pounds since March 2021.  She is currently on Xarelto 20 mg which she is tolerating well without bleeding diathesis. She is presently being treated for UTI.   Past Medical History:  Diagnosis Date  . Anxiety and depression   . Aortic arch atherosclerosis ([facility]3/04/2019  . Exogenous obesity   . Fibromyalgia   . Hyperlipidemia   . Hypothyroidism   . Morbid obesity ([facility]  . OSA (obstructive sleep apnea)    uses CPAP  . Palpitations 07/13/2018  . Paroxysmal atrial flutter ([facility] [place]  . Periodic limb movement disorder   . REM sleep behavior disorder   . Rhinitis   . SOB (shortness of breath)   . Spinal stenosis   . Unspecified hypothyroidism     Past Surgical History:   Procedure Laterality Date  . CARDIOVERSION N/A 04/29/2015   Procedure: CARDIOVERSION;  Surgeon: JaAdrian ProwsMD;  [facility]: Cardiovascular;  Laterality: N/A;  . CARPAL TUNNEL RELEASE     Bilateral  . DILATION AND CURETTAGE OF UTERUS    . left rotator cuff    . resection uterine polyp  2011   Social History   Tobacco Use  . Smoking status: Never Smoker  . Smokeless tobacco: Never Used  Substance Use Topics  . Alcohol use: No    Review of Systems  Constitutional: Positive for malaise/fatigue.  Cardiovascular: Positive for dyspnea on exertion and palpitations. Negative for chest pain, leg swelling, orthopnea, paroxysmal nocturnal dyspnea and syncope.  Hematologic/Lymphatic: Does not bruise/bleed easily.  Musculoskeletal: Positive for arthritis (left hip) and back pain.  Gastrointestinal: Negative for melena.  Neurological: Positive for dizziness.   Objective:  Blood pressure (!) 142/75, pulse 75, resp. rate 16, height _0  (1.676 m), weight 257 lb (116.6 kg), SpO2 98 %. Body mass index is 41.48 kg/m.   Vitals with BMI 02/14/2020 07/27/2019 01/23/2019  Height _1  _2  _3   Weight 257 lbs 266 lbs 13 oz 260 lbs  BMI 41.5 [id number] [id number] 75 61 70  Pulse 75 61 68    Orthostatic VS for the past 72 hrs (Last 3 readings):  Orthostatic BP Patient Position BP Location Cuff Size Orthostatic Pulse  02/14/20 1100 122/82 Standing Left Arm Large 114  02/14/20  1059 143/71 Sitting Left Arm Large 111  02/14/20 1058 154/72 Supine Left Arm Large 76    Physical Exam Constitutional:      Comments: Morbidly obese  Neck:     Comments: Short neck and difficult to evaluate JVP Cardiovascular:     Rate and Rhythm: Tachycardia present. Rhythm irregularly irregular.     Pulses:          Carotid pulses are 2+ on the right side and 2+ on the left side.      Radial pulses are 2+ on the right side and 2+ on the left side.     Heart sounds:  Normal heart sounds. No murmur heard.  No gallop.      Comments: Femoral and popliteal pulse difficult to feel due to patient's body habitus.  No JVD. No leg edema Pulmonary:     Effort: Pulmonary effort is normal.     Breath sounds: Normal breath sounds.  Abdominal:     General: Bowel sounds are normal.     Palpations: Abdomen is soft.     Comments: Obese     Laboratory examination:   CMP Latest Ref Rng & Units 11/14/2018 06/09/2018 04/30/2015  Glucose 65 - 99 mg/dL 110(H) 79 121(H)  BUN 8 - 27 mg/dL _0 Creatinine 0.57 - 1.00 mg/dL 0.90 0.86 0.85  Sodium 134 - 144 mmol/L 143 142 140  Potassium 3.5 - 5.2 mmol/L 4.4 4.3 4.3  Chloride 96 - 106 mmol/L 106 102 109  CO2 20 - 29 mmol/L _1 Calcium 8.7 - 10.3 mg/dL 9.0 9.2 9.1  Total Protein 6.0 - 8.5 g/dL 6.9 - -  Total Bilirubin 0.0 - 1.2 mg/dL 0.3 - -  Alkaline Phos 39 - 117 IU/L 47 - -  AST 0 - 40 IU/L 17 - -  ALT 0 - 32 IU/L 12 - -   CBC Latest Ref Rng & Units 11/14/2018 06/09/2018 04/30/2015  WBC 3.4 - 10.8 x10E3/uL 5.9 6.4 7.4  Hemoglobin 11.1 - 15.9 g/dL 12.5 13.1 11.6(L)  Hematocrit 34.0 - 46.6 % 37.3 39.4 36.2  Platelets 150 - 450 x10E3/uL 178 214 158   Lipid Panel     Component Value Date/Time   CHOL 129 11/14/2018 0830   TRIG 83 11/14/2018 0830   HDL 49 11/14/2018 0830   LDLCALC 63 11/14/2018 0830   HEMOGLOBIN A1C No results found for: HGBA1C, MPG TSH No results for input(s): TSH in the last 8760 hours.   External labs:  02/27/2019: HDL 46, LDL 63, triglycerides 76, total cholesterol 124 BUN 14, creatinine 0.90, TSH 0.84  11/21/2017: Cholesterol 133, triglycerides 88, HDL 47, LDL 88.  Creatinine 0.94, EGFR 63/72, potassium 4.7, CMP normal.  CBC normal.   Current Meds  Medication Sig  . Apple Cider Vinegar 188 MG CAPS Take by mouth.  . Ascorbic Acid (VITAMIN C) 100 MG tablet Take 100 mg by mouth daily.  Marland Kitchen levothyroxine (SYNTHROID) 175 MCG tablet Take 175 mcg by mouth daily.  . metoprolol  tartrate (LOPRESSOR) 25 MG tablet TAKE 1 TABLET BY MOUTH THREE TIMES A DAY  . nitrofurantoin (MACRODANTIN) 100 MG capsule Take 100 mg by mouth 4 (four) times daily.  Marland Kitchen olmesartan (BENICAR) 5 MG tablet Take 2 tablets (10 mg total) by mouth daily.  . rosuvastatin (CRESTOR) 10 MG tablet TAKE ONE HALF TABLET BY MOUTH ONCE A DAY.  Marland Kitchen VITAMIN D PO Take 5,000 Units by mouth daily.   Alveda Reasons 20 MG  TABS tablet TAKE 1 TABLET BY MOUTH EVERY DAY IN THE EVENING    Radiology:   No results found.  Cardiac Studies:   Echocardiogram 03/18/2015: Left ventricular cavity is normal in size.  Normal global wall motion.  Visual EF 60-65% Left atrial cavity is mildly dilated Trace aortic regurgitation.  Mild mitral regurgitation.  Mild tricuspid regurgitation No evidence of pulmonary hypertension.   Sleep Study 2006: Used CPAP. Repeat sleep study. 05/28/2015 by Dr. Caryl Comes: No e/o sleep apnea.  Chest x-ray 04/30/2015: Mild CHF, atherosclerotic calcification the aortic arch.  Direct current cardioversion 04/29/2015: A. Flutter to NSR with 75J x 1.  Treadmill Exercise stress 10/11/2014: Indication:Palpitations, Chest pain The patient exercised according to Bruce Protocol, Total time recorded 04:45mn achieving max heart rate of 141 which was 90% of MPHR for age and 5.80 METS of work. Normal BP response. Resting ECG NSR, low voltage complexes. There was no ST-T changes of ischemia with exercise stress test. Stress terminated due to THR (>85% MPHR)/MPHR met and fatigue. Decreased exercise tolerence.   EKG:  EKG 02/14/2020: Atrial fibrillation with rapid ventricular response at a rate of 108 bpm. Normal axis. Incomplete RBBB.  EKG 07/27/2019: Normal sinus rhythm with rate of 64 bpm, left abnormality, normal axis.  No evidence of ischemia, normal EKG.   No significant change from EKG 01/23/2019.   Assessment:     ICD-10-CM   1. Paroxysmal atrial flutter (HCC) CHA2DS2-VASc Score is 3.  Yearly risk of stroke:  3.2% (A, F, HTN).  I48.92 EKG 12-Lead    CBC    Basic metabolic panel    PCV ECHOCARDIOGRAM COMPLETE    metoprolol tartrate (LOPRESSOR) 50 MG tablet  2. Essential hypertension  I10 metoprolol tartrate (LOPRESSOR) 50 MG tablet  3. Nonrheumatic mitral valve regurgitation  I34.0 PCV ECHOCARDIOGRAM COMPLETE  4. Other fatigue  R53.83   5. Dizziness  R42   6. Mixed hyperlipidemia  E78.2    Meds ordered this encounter  Medications  . metoprolol tartrate (LOPRESSOR) 50 MG tablet    Sig: Take 1 tablet (50 mg total) by mouth 3 (three) times daily.    Dispense:  180 tablet    Refill:  5   Medications Discontinued During This Encounter  Medication Reason  . metoprolol tartrate (LOPRESSOR) 25 MG tablet Change in therapy   This patients CHA2DS2-VASc Score 3 (HTN, Age, F) and yearly risk of stroke 3.2%.   Recommendations:    [name] [name] is a 69y.o. female  with paroxysmal atypical atrial flutter, hyperlipidemia, morbid obesity and hypertension. She was found to be in atypical atrial flutter with rapid ventricular response on 169/48/5462and diastolic heart failure. It was felt to be atypical atrial flutter and was evaluated by Dr. SVirl Axe recommended cardioversion, successful direct current cardioversion on 04/29/2015 with no recurrence. However, she presents today in atrial fibrillation with rapid ventricular response.   Patient presents for 6 month follow up with complaints of 2 episodes of dizziness and increased fatigue over the last 3-4 days. EKG today revealed patient to be in atrial fibrillation with rapid ventricular response. This is likely the etiology of her symptoms. Will increase metoprolol tartrate from 25 mg to 50 mg three times daily for improved rate control. Encouraged patient to monitor for adverse reactions and to let our office know if she experiences any issues, as patient is hesitant to increase medications. She verbalized understanding and agreement.   Will also  schedule her for cardioversion as she is on  long term anticogaulation, with BMP and CBC prior. Discussed with patient risks and benefits of cardioversion, she would like to proceed with cardioversion. Will continue Xarelto as patient has CHA2DS2-VASc score of 3 and is tolerating without bleeding diathesis.  In view of paroxysmal atrial fibrillation/flutter will also obtain echocardiogram to evaluate heart structure and function.   Of note patient's most recent TSH was in October 2020. She is scheduled for annual follow with her PCP later this month and will get lab work done then. Will review when available. Also reviewed labs today, lipids are well controlled. Blood pressure is well controlled at home according to patient. Congratulated patient on weight loss and encouraged her to continue lifestyle modifications for weight loss and regular physical activity.   Discussed with patient signs and symptoms that should trigger her to present to the emergency department for further evaluation and treatment.   Follow up in 3-4 weeks for atrial fibrillation, following cardioversion and echocardiogram.   Patient was seen in collaboration with Dr. Einar Gip. He also reviewed patient's chart and Dr. Einar Gip is in agreement of the plan.    Alethia Berthold, PA-C 02/14/2020, 1:17 PM Office: [phone]

## 2020-02-14 ENCOUNTER — Other Ambulatory Visit: Payer: Self-pay

## 2020-02-14 ENCOUNTER — Ambulatory Visit: Payer: PPO | Admitting: Student

## 2020-02-14 ENCOUNTER — Encounter: Payer: Self-pay | Admitting: Student

## 2020-02-14 VITALS — BP 142/75 | HR 75 | Resp 16 | Ht 66.0 in | Wt 257.0 lb

## 2020-02-14 DIAGNOSIS — I4892 Unspecified atrial flutter: Secondary | ICD-10-CM

## 2020-02-14 DIAGNOSIS — E782 Mixed hyperlipidemia: Secondary | ICD-10-CM | POA: Diagnosis not present

## 2020-02-14 DIAGNOSIS — I340 Nonrheumatic mitral (valve) insufficiency: Secondary | ICD-10-CM

## 2020-02-14 DIAGNOSIS — R5383 Other fatigue: Secondary | ICD-10-CM

## 2020-02-14 DIAGNOSIS — R42 Dizziness and giddiness: Secondary | ICD-10-CM

## 2020-02-14 DIAGNOSIS — I10 Essential (primary) hypertension: Secondary | ICD-10-CM | POA: Diagnosis not present

## 2020-02-14 MED ORDER — METOPROLOL TARTRATE 50 MG PO TABS: 50.0000 mg | ORAL_TABLET | Freq: Three times a day (TID) | ORAL | 5 refills | Status: DC

## 2020-02-15 LAB — CBC
Hematocrit: 41.2 % (ref 34.0–46.6)
Hemoglobin: 13.4 g/dL (ref 11.1–15.9)
MCH: 29.6 pg (ref 26.6–33.0)
MCHC: 32.5 g/dL (ref 31.5–35.7)
MCV: 91 fL (ref 79–97)
Platelets: 196 10*3/uL (ref 150–450)
RBC: 4.52 x10E6/uL (ref 3.77–5.28)
RDW: 12.3 % (ref 11.7–15.4)
WBC: 10.2 10*3/uL (ref 3.4–10.8)

## 2020-02-15 LAB — BASIC METABOLIC PANEL
BUN/Creatinine Ratio: 26 (ref 12–28)
BUN: 23 mg/dL (ref 8–27)
CO2: 24 mmol/L (ref 20–29)
Calcium: 9.6 mg/dL (ref 8.7–10.3)
Chloride: 101 mmol/L (ref 96–106)
Creatinine, Ser: 0.9 mg/dL (ref 0.57–1.00)
GFR calc Af Amer: 75 mL/min/{1.73_m2} (ref >59)
GFR calc non Af Amer: 65 mL/min/{1.73_m2} (ref >59)
Glucose: 112 mg/dL — ABNORMAL HIGH (ref 65–99)
Potassium: 4.7 mmol/L (ref 3.5–5.2)
Sodium: 140 mmol/L (ref 134–144)

## 2020-02-15 NOTE — Progress Notes (Signed)
Spoke to patient

## 2020-02-15 NOTE — Progress Notes (Signed)
Please inform patient her labs are normal.  Kidney function, electrolytes, blood counts are normal.

## 2020-02-19 ENCOUNTER — Other Ambulatory Visit: Payer: Self-pay

## 2020-02-19 ENCOUNTER — Telehealth: Payer: Self-pay | Admitting: Student

## 2020-02-19 ENCOUNTER — Ambulatory Visit: Payer: PPO

## 2020-02-19 DIAGNOSIS — I10 Essential (primary) hypertension: Secondary | ICD-10-CM | POA: Diagnosis not present

## 2020-02-19 DIAGNOSIS — I4892 Unspecified atrial flutter: Secondary | ICD-10-CM

## 2020-02-19 DIAGNOSIS — R42 Dizziness and giddiness: Secondary | ICD-10-CM | POA: Diagnosis not present

## 2020-02-19 DIAGNOSIS — I340 Nonrheumatic mitral (valve) insufficiency: Secondary | ICD-10-CM

## 2020-02-19 DIAGNOSIS — R5383 Other fatigue: Secondary | ICD-10-CM

## 2020-02-19 MED ORDER — METOPROLOL TARTRATE 25 MG PO TABS: 50.0000 mg | ORAL_TABLET | Freq: Two times a day (BID) | ORAL | 3 refills | Status: DC

## 2020-02-19 NOTE — Telephone Encounter (Signed)
Spoke with patient, she reports she experienced dizziness and fatigue taking increased dose of metoprolol tartrate.  However patient is unclear whether she has 25 or 50 mg tablets at home and states she is currently taking 1 tablet 4 times daily.  Requested the patient go home and call our office with information regarding her current metoprolol titrate tablets.  Will advise further.  Patient also reports that she had only been taking 5 mg of olmesartan daily, instead of the 10 mg daily prescribed.  Patient will start taking 10 mg of olmesartan daily.  Encourage patient to continue to monitor blood pressure at home.  We will follow-up in the office.

## 2020-02-19 NOTE — Telephone Encounter (Signed)
Spoke with patient she is currently taking 25 mg of metoprolol tartrate 4 times daily.  She states higher doses of metoprolol because her nausea, dizziness, fatigue.  Instructed her to take 50 mg (2 tablets) of metoprolol tartrate twice daily.  She will monitor her heart rate and blood pressure and let the office know if her heart rate remains greater than 100.  She will also continue to take olmesartan 10 mg daily.

## 2020-02-20 NOTE — H&P (View-Only) (Signed)
Please inform patient echo is normal with normal pumping activity. Your heart was noted to be in irregular rhythm, which we expected and is why we have you scheduled for cardioversion on the 26th.

## 2020-02-20 NOTE — Progress Notes (Signed)
Called and spoke with patient regarding her echo results. Patient is aware of her appointment.

## 2020-02-20 NOTE — Progress Notes (Signed)
Please inform patient echo is normal with normal pumping activity. Your heart was noted to be in irregular rhythm, which we expected and is why we have you scheduled for cardioversion on the 26th.

## 2020-02-23 ENCOUNTER — Other Ambulatory Visit (HOSPITAL_COMMUNITY)
Admission: RE | Admit: 2020-02-23 | Discharge: 2020-02-23 | Disposition: A | Discharge status: Home / Self Care | Payer: PPO | Source: Ambulatory Visit | Attending: Cardiology | Admitting: Cardiology

## 2020-02-23 DIAGNOSIS — Z20822 Contact with and (suspected) exposure to covid-19: Secondary | ICD-10-CM | POA: Diagnosis not present

## 2020-02-23 DIAGNOSIS — Z01812 Encounter for preprocedural laboratory examination: Secondary | ICD-10-CM | POA: Diagnosis not present

## 2020-02-23 LAB — SARS CORONAVIRUS 2 (TAT 6-24 HRS): SARS Coronavirus 2: NEGATIVE

## 2020-02-25 DIAGNOSIS — I484 Atypical atrial flutter: Secondary | ICD-10-CM | POA: Diagnosis present

## 2020-02-25 NOTE — Telephone Encounter (Signed)
Thank you very much 

## 2020-02-25 NOTE — Telephone Encounter (Signed)
Pt called stating that she was not able to tolerate her metoprolol and Olmesartan dose changes. Complains of "dizziness and fatigue " since the recent medication changes. Per Celeste's telephone note on 02/19/20, pt was previously noted to taking metoprolol tart 25 mg QID. Pt was educated about appropriate metoprolol tartrate dosing schedule and was instructed to adjust her dose to 2 tabs of 25 mg BID. Pt was also was instructed to increase her Olmesartan to 2 tabs of 5 mg Olmesartan. Since making the recommended changes, pt reports that she is still having complains of dizziness, leg and body weakness. Complains most prominent on 10/22. Pt self adjusted her dose back to metoprolol tart 25 mg TID and Olmesartan 5 mg. Pt reports that her symptoms have improved since and denies any recent recurrence. Pt has a scheduled cardioversion tomorrow (02/26/20). Recent home vital readings listed below:   10/25:   133/85   96 10/24:   124/64   66 10/23:   108/72   74  10/22:   116/73   102        112/76  105  (switched back to metoprolol tart 25 mg TID and Olmesartan 5 mg)  10/21:   100/71   91    Reviewed the indications and dosing schedules of metoprolol tartrate and Olmesartan. Pt agreeable to adjust her metoprolol dose to 25 mg AM and 50 mg PM. Will continue with olmesartan 5 mg daily in the afternoon. May consider switching to metoprolol succinate if pt is stable post cardioversion. Recommended pt continue monitoring and documenting her home vital readings and to review her results with Celeste at next OV on 03/04/20. Pt agreeable with the listed changes and verbalized understanding of her current medication dosing schedule.

## 2020-02-26 ENCOUNTER — Encounter (HOSPITAL_COMMUNITY)
Admission: RE | Disposition: A | Discharge status: Home / Self Care | Payer: Self-pay | Source: Home / Self Care | Attending: Cardiology

## 2020-02-26 ENCOUNTER — Ambulatory Visit (HOSPITAL_COMMUNITY)
Admission: RE | Admit: 2020-02-26 | Discharge: 2020-02-26 | Disposition: A | Discharge status: Home / Self Care | Payer: PPO | Attending: Cardiology | Admitting: Cardiology

## 2020-02-26 ENCOUNTER — Ambulatory Visit (HOSPITAL_COMMUNITY): Payer: PPO | Admitting: Anesthesiology

## 2020-02-26 ENCOUNTER — Encounter (HOSPITAL_COMMUNITY): Payer: Self-pay | Admitting: Cardiology

## 2020-02-26 ENCOUNTER — Other Ambulatory Visit: Payer: Self-pay

## 2020-02-26 DIAGNOSIS — E039 Hypothyroidism, unspecified: Secondary | ICD-10-CM | POA: Diagnosis not present

## 2020-02-26 DIAGNOSIS — I484 Atypical atrial flutter: Secondary | ICD-10-CM | POA: Diagnosis present

## 2020-02-26 DIAGNOSIS — I10 Essential (primary) hypertension: Secondary | ICD-10-CM | POA: Diagnosis not present

## 2020-02-26 DIAGNOSIS — I4892 Unspecified atrial flutter: Secondary | ICD-10-CM | POA: Insufficient documentation

## 2020-02-26 DIAGNOSIS — I4891 Unspecified atrial fibrillation: Secondary | ICD-10-CM | POA: Insufficient documentation

## 2020-02-26 DIAGNOSIS — I480 Paroxysmal atrial fibrillation: Secondary | ICD-10-CM | POA: Diagnosis not present

## 2020-02-26 HISTORY — PX: CARDIOVERSION: SHX1299

## 2020-02-26 SURGERY — CARDIOVERSION
Anesthesia: General

## 2020-02-26 MED ORDER — SODIUM CHLORIDE 0.9 % IV SOLN: INTRAVENOUS | Status: DC | PRN

## 2020-02-26 MED ORDER — LIDOCAINE 2% (20 MG/ML) 5 ML SYRINGE
INTRAMUSCULAR | Status: DC | PRN
  Administered 2020-02-26: 80 mg via INTRAVENOUS

## 2020-02-26 MED ORDER — PROPOFOL 10 MG/ML IV BOLUS
INTRAVENOUS | Status: DC | PRN
  Administered 2020-02-26: 100 mg via INTRAVENOUS

## 2020-02-26 NOTE — Discharge Instructions (Signed)
Electrical Cardioversion Electrical cardioversion is the delivery of a jolt of electricity to restore a normal rhythm to the heart. A rhythm that is too fast or is not regular keeps the heart from pumping well. In this procedure, sticky patches or metal paddles are placed on the chest to deliver electricity to the heart from a device. This procedure may be done in an emergency if:  There is low or no blood pressure as a result of the heart rhythm.  Normal rhythm must be restored as fast as possible to protect the brain and heart from further damage.  It may save a life. This may also be a scheduled procedure for irregular or fast heart rhythms that are not immediately life-threatening. Tell a health care provider about:  Any allergies you have.  All medicines you are taking, including vitamins, herbs, eye drops, creams, and over-the-counter medicines.  Any problems you or family members have had with anesthetic medicines.  Any blood disorders you have.  Any surgeries you have had.  Any medical conditions you have.  Whether you are pregnant or may be pregnant. What are the risks? Generally, this is a safe procedure. However, problems may occur, including:  Allergic reactions to medicines.  A blood clot that breaks free and travels to other parts of your body.  The possible return of an abnormal heart rhythm within hours or days after the procedure.  Your heart stopping (cardiac arrest). This is rare. What happens before the procedure? Medicines  Your health care provider may have you start taking: ? Blood-thinning medicines (anticoagulants) so your blood does not clot as easily. ? Medicines to help stabilize your heart rate and rhythm.  Ask your health care provider about: ? Changing or stopping your regular medicines. This is especially important if you are taking diabetes medicines or blood thinners. ? Taking medicines such as aspirin and ibuprofen. These medicines can  thin your blood. Do not take these medicines unless your health care provider tells you to take them. ? Taking over-the-counter medicines, vitamins, herbs, and supplements. General instructions  Follow instructions from your health care provider about eating or drinking restrictions.  Plan to have someone take you home from the hospital or clinic.  If you will be going home right after the procedure, plan to have someone with you for 24 hours.  Ask your health care provider what steps will be taken to help prevent infection. These may include washing your skin with a germ-killing soap. What happens during the procedure?   An IV will be inserted into one of your veins.  Sticky patches (electrodes) or metal paddles may be placed on your chest.  You will be given a medicine to help you relax (sedative).  An electrical shock will be delivered. The procedure may vary among health care providers and hospitals. What can I expect after the procedure?  Your blood pressure, heart rate, breathing rate, and blood oxygen level will be monitored until you leave the hospital or clinic.  Your heart rhythm will be watched to make sure it does not change.  You may have some redness on the skin where the shocks were given. Follow these instructions at home:  Do not drive for 24 hours if you were given a sedative during your procedure.  Take over-the-counter and prescription medicines only as told by your health care provider.  Ask your health care provider how to check your pulse. Check it often.  Rest for 48 hours after the procedure or   as told by your health care provider.  Avoid or limit your caffeine use as told by your health care provider.  Keep all follow-up visits as told by your health care provider. This is important. Contact a health care provider if:  You feel like your heart is beating too quickly or your pulse is not regular.  You have a serious muscle cramp that does not go  away. Get help right away if:  You have discomfort in your chest.  You are dizzy or you feel faint.  You have trouble breathing or you are short of breath.  Your speech is slurred.  You have trouble moving an arm or leg on one side of your body.  Your fingers or toes turn cold or blue. Summary  Electrical cardioversion is the delivery of a jolt of electricity to restore a normal rhythm to the heart.  This procedure may be done right away in an emergency or may be a scheduled procedure if the condition is not an emergency.  Generally, this is a safe procedure.  After the procedure, check your pulse often as told by your health care provider. This information is not intended to replace advice given to you by your health care provider. Make sure you discuss any questions you have with your health care provider. Document Revised: 11/20/2018 Document Reviewed: 11/20/2018 Elsevier Patient Education  2020 [employer]  

## 2020-02-26 NOTE — Transfer of Care (Signed)
Immediate Anesthesia Transfer of Care Note  Patient: [name] [name]  Procedure(s) Performed: CARDIOVERSION (N/A )  Patient Location: Endoscopy Unit  Anesthesia Type:General  Level of Consciousness: drowsy and patient cooperative  Airway & Oxygen Therapy: Patient Spontanous Breathing  Post-op Assessment: Report given to RN and Post -op Vital signs reviewed and stable  Post vital signs: Reviewed and stable  Last Vitals:  Vitals Value Taken Time  BP 95/50   Temp    Pulse 77   Resp 16   SpO2 95%     Last Pain:  Vitals:   02/26/20 1130  TempSrc: Axillary  PainSc: 0-No pain         Complications: No complications documented.

## 2020-02-26 NOTE — CV Procedure (Signed)
Direct current cardioversion:  Indication symptomatic A. Flutter.  Procedure: Using 100 mg of IV Propofol and 80 IV Lidocaine (for reducing venous pain) for achieving deep sedation, synchronized direct current cardioversion performed. Patient was delivered with 75x 1 then 150 Joules of electricity X 1 with success to NSR. Patient tolerated the procedure well. No immediate complication noted.  Patinet converted from Atrial flutter to Atrial fibrillation after 1st shock then into sinus with second cardioversion.   Yates Decamp, MD, [facility] 02/26/2020, 12:05 PM Office: [phone] Pager: [phone]

## 2020-02-26 NOTE — Anesthesia Preprocedure Evaluation (Signed)
Anesthesia Evaluation  Patient identified by MRN, date of birth, ID band Patient awake    Reviewed: Patient's Chart, lab work & pertinent test results, reviewed documented beta blocker date and time   Airway Mallampati: II  TM Distance: >3 FB Neck ROM: Full    Dental  (+) Teeth Intact   Pulmonary sleep apnea and Continuous Positive Airway Pressure Ventilation ,    Pulmonary exam normal        Cardiovascular hypertension, Pt. on medications and Pt. on home beta blockers + dysrhythmias Atrial Fibrillation  Rhythm:Irregular Rate:Normal     Neuro/Psych Anxiety Depression    GI/Hepatic negative GI ROS, Neg liver ROS,   Endo/Other  Hypothyroidism   Renal/GU      Musculoskeletal  (+) Fibromyalgia -Spinal stenosis   Abdominal (+)  Abdomen: soft. Bowel sounds: normal.  Peds  Hematology negative hematology ROS (+)   Anesthesia Other Findings   Reproductive/Obstetrics                             Anesthesia Physical Anesthesia Plan  ASA: III  Anesthesia Plan: General   Post-op Pain Management:    Induction: Intravenous  PONV Risk Score and Plan: 3  Airway Management Planned: Mask  Additional Equipment: None  Intra-op Plan:   Post-operative Plan:   Informed Consent: I have reviewed the patients History and Physical, chart, labs and discussed the procedure including the risks, benefits and alternatives for the proposed anesthesia with the patient or authorized representative who has indicated his/her understanding and acceptance.     Dental advisory given  Plan Discussed with: CRNA  Anesthesia Plan Comments: (Lab Results      Component                Value               Date                      WBC                      10.2                02/14/2020                HGB                      13.4                02/14/2020                HCT                      41.2                 02/14/2020                MCV                      91                  02/14/2020                PLT                      196  02/14/2020          )        Anesthesia Quick Evaluation

## 2020-02-26 NOTE — Anesthesia Postprocedure Evaluation (Signed)
Anesthesia Post Note  Patient: SHRITHA BRESEE  Procedure(s) Performed: CARDIOVERSION (N/A )     Patient location during evaluation: PACU Anesthesia Type: General Level of consciousness: awake and alert Pain management: pain level controlled Vital Signs Assessment: post-procedure vital signs reviewed and stable Respiratory status: spontaneous breathing, nonlabored ventilation, respiratory function stable and patient connected to nasal cannula oxygen Cardiovascular status: blood pressure returned to baseline and stable Postop Assessment: no apparent nausea or vomiting Anesthetic complications: no   No complications documented.  Last Vitals:  Vitals:   02/26/20 1220 02/26/20 1230  BP: (!) 108/52 (!) 108/48  Pulse: 78 79  Resp: 14 15  Temp:    SpO2: 97% 98%    Last Pain:  Vitals:   02/26/20 1230  TempSrc:   PainSc: 0-No pain                 Earl Lites P Adenike Shidler

## 2020-02-26 NOTE — Interval H&P Note (Signed)
History and Physical Interval Note:  02/26/2020 11:59 AM  [name] [name]  has presented today for surgery, with the diagnosis of A-FIB.  The various methods of treatment have been discussed with the patient and family. After consideration of risks, benefits and other options for treatment, the patient has consented to  Procedure(s): CARDIOVERSION (N/A) as a surgical intervention.  The patient's history has been reviewed, patient examined, no change in status, stable for surgery.  I have reviewed the patient's chart and labs.  Questions were answered to the patient's satisfaction.     Yates Decamp

## 2020-02-27 ENCOUNTER — Encounter (HOSPITAL_COMMUNITY): Payer: Self-pay | Admitting: Cardiology

## 2020-02-28 ENCOUNTER — Telehealth: Payer: Self-pay

## 2020-02-28 NOTE — Telephone Encounter (Signed)
She can stop Olmesartan. Send her my chart message

## 2020-02-28 NOTE — Telephone Encounter (Signed)
Patient called and said for the last 2 days she has been having dizziness, she is taking her Metoprolol only 3 times daily. She believes its the olmesartan which she is only taking 5 mg of.

## 2020-03-03 ENCOUNTER — Other Ambulatory Visit: Payer: Self-pay

## 2020-03-03 ENCOUNTER — Encounter: Payer: Self-pay | Admitting: Student

## 2020-03-03 ENCOUNTER — Ambulatory Visit: Payer: PPO | Admitting: Student

## 2020-03-03 VITALS — BP 138/79 | HR 41 | Resp 16 | Ht 66.0 in | Wt 259.0 lb

## 2020-03-03 DIAGNOSIS — R0602 Shortness of breath: Secondary | ICD-10-CM

## 2020-03-03 DIAGNOSIS — R42 Dizziness and giddiness: Secondary | ICD-10-CM | POA: Diagnosis not present

## 2020-03-03 DIAGNOSIS — I10 Essential (primary) hypertension: Secondary | ICD-10-CM | POA: Diagnosis not present

## 2020-03-03 DIAGNOSIS — I4892 Unspecified atrial flutter: Secondary | ICD-10-CM

## 2020-03-03 MED ORDER — METOPROLOL TARTRATE 25 MG PO TABS
25.0000 mg | ORAL_TABLET | Freq: Three times a day (TID) | ORAL | 1 refills | Status: DC
Start: 2020-03-03 — End: 2021-01-23

## 2020-03-03 NOTE — H&P (View-Only) (Signed)
Primary Physician:  Burnard Bunting, MD  Patient ID: [name] [name], female    DOB: [birth year], 69 y.o.   MRN: [id number]  Subjective:   Chief Complaint  Patient presents with  . Atrial Fibrillation  . Follow-up   HPI: [name] [name]  is a 69 y.o. female  with paroxysmal atypical atrial flutter, hyperlipidemia, morbid obesity and hypertension. She was found to be in atrial flutter with rapid ventricular response on 03/13/2015. It was felt to be atypical atrial flutter and was evaluated by Dr. [name], recommended cardioversion, successful direct current cardioversion on 04/29/2015 with no recurrence.  02/14/2020 patient presented in atrial fibrillation with rapid ventricular response and underwent successful cardioversion 02/26/2020.  Patient presents for follow-up after successful cardioversion 02/26/2020.  She called the office on-call over the weekend with complaints of dizziness and lightheadedness, she thinks she may be back in atrial fibrillation.  Patient states following her cardioversion 02/26/2020 she relaxed for 2 days and then went out walking and shopping 2 days ago without issue.  However yesterday in the evening she noticed palpitations, dizziness, and shortness of breath.  Denies chest pain, orthopnea, PND, syncope, near syncope.  She continues to tolerate Xarelto without bleeding diathesis.  Following her cardioversion she stopped her olmesartan due to dizziness which she attributed to low blood pressure.  Since stopping olmesartan at home blood pressure readings are 086-578 systolic and 46N diastolic.  Past Medical History:  Diagnosis Date  . Anxiety and depression   . Aortic arch atherosclerosis ([place]) 07/13/2018  . Exogenous obesity   . Fibromyalgia   . Hyperlipidemia   . Hypothyroidism   . Morbid obesity ([place])   . OSA (obstructive sleep apnea)    uses CPAP  . Palpitations 07/13/2018  . Paroxysmal atrial flutter ([place])   . Periodic limb movement disorder   . REM  sleep behavior disorder   . Rhinitis   . SOB (shortness of breath)   . Spinal stenosis   . Unspecified hypothyroidism     Past Surgical History:  Procedure Laterality Date  . CARDIOVERSION N/A 04/29/2015   Procedure: CARDIOVERSION;  Surgeon: Adrian Prows, MD;  Location: [place];  Service: Cardiovascular;  Laterality: N/A;  . CARDIOVERSION N/A 02/26/2020   Procedure: CARDIOVERSION;  Surgeon: Adrian Prows, MD;  Location: [place];  Service: Cardiovascular;  Laterality: N/A;  . CARPAL TUNNEL RELEASE     Bilateral  . DILATION AND CURETTAGE OF UTERUS    . left rotator cuff    . resection uterine polyp  2011   Social History   Tobacco Use  . Smoking status: Never Smoker  . Smokeless tobacco: Never Used  Substance Use Topics  . Alcohol use: No    Review of Systems  Constitutional: Negative for malaise/fatigue.  Cardiovascular: Positive for dyspnea on exertion and palpitations. Negative for chest pain, claudication, leg swelling, near-syncope, orthopnea, paroxysmal nocturnal dyspnea and syncope.  Respiratory: Positive for shortness of breath.   Hematologic/Lymphatic: Does not bruise/bleed easily.  Musculoskeletal: Positive for arthritis (left hip) and back pain.  Gastrointestinal: Negative for melena.  Neurological: Positive for dizziness.   Objective:  Blood pressure 138/79, pulse (!) 41, resp. rate 16, height $RemoveBe'5\' 6"'gXXZobuuN$  (1.676 m), weight 259 lb (117.5 kg), SpO2 96 %. Body mass index is 41.8 kg/m.   Vitals with BMI 03/03/2020 02/26/2020 02/26/2020  Height $Remov'5\' 6"'ZmXvXc$  - -  Weight 259 lbs - -  BMI 62.95 - -  Systolic 284 132 440  Diastolic 79 48 52  Pulse 41 79 78    Orthostatic VS for the past 72 hrs (Last 3 readings):  Patient Position BP Location Cuff Size  03/03/20 1306 Sitting Left Arm Large    Physical Exam Constitutional:      Comments: Morbidly obese  Neck:     Comments: Short neck and difficult to evaluate JVP Cardiovascular:     Rate and Rhythm: Tachycardia  present. Rhythm irregularly irregular.     Pulses: Intact distal pulses.     Heart sounds: Normal heart sounds. No murmur heard.  No gallop.      Comments: Femoral and popliteal pulse difficult to feel due to patient's body habitus.  No JVD. No leg edema Pulmonary:     Effort: Pulmonary effort is normal.     Breath sounds: Normal breath sounds.  Abdominal:     General: Bowel sounds are normal.     Palpations: Abdomen is soft.     Comments: Obese    Laboratory examination:   CMP Latest Ref Rng & Units 02/14/2020 11/14/2018 06/09/2018  Glucose 65 - 99 mg/dL 112(H) 110(H) 79  BUN 8 - 27 mg/dL $Remove'23 17 17  'glAtruH$ Creatinine 0.57 - 1.00 mg/dL 0.90 0.90 0.86  Sodium 134 - 144 mmol/L 140 143 142  Potassium 3.5 - 5.2 mmol/L 4.7 4.4 4.3  Chloride 96 - 106 mmol/L 101 106 102  CO2 20 - 29 mmol/L $RemoveB'24 23 24  'bGXnOIQl$ Calcium 8.7 - 10.3 mg/dL 9.6 9.0 9.2  Total Protein 6.0 - 8.5 g/dL - 6.9 -  Total Bilirubin 0.0 - 1.2 mg/dL - 0.3 -  Alkaline Phos 39 - 117 IU/L - 47 -  AST 0 - 40 IU/L - 17 -  ALT 0 - 32 IU/L - 12 -   CBC Latest Ref Rng & Units 02/14/2020 11/14/2018 06/09/2018  WBC 3.4 - 10.8 x10E3/uL 10.2 5.9 6.4  Hemoglobin 11.1 - 15.9 g/dL 13.4 12.5 13.1  Hematocrit 34.0 - 46.6 % 41.2 37.3 39.4  Platelets 150 - 450 x10E3/uL 196 178 214   Lipid Panel     Component Value Date/Time   CHOL 129 11/14/2018 0830   TRIG 83 11/14/2018 0830   HDL 49 11/14/2018 0830   LDLCALC 63 11/14/2018 0830   HEMOGLOBIN A1C No results found for: HGBA1C, MPG TSH No results for input(s): TSH in the last 8760 hours.  External labs:  02/27/2019: HDL 46, LDL 63, triglycerides 76, total cholesterol 124 BUN 14, creatinine 0.90, TSH 0.84  11/21/2017: Cholesterol 133, triglycerides 88, HDL 47, LDL 88.  Creatinine 0.94, EGFR 63/72, potassium 4.7, CMP normal.  CBC normal.   Current Meds  Medication Sig  . Apple Cider Vinegar 188 MG CAPS Take by mouth.  . Ascorbic Acid (VITAMIN C) 100 MG tablet Take 100 mg by mouth daily.  Marland Kitchen  levothyroxine (SYNTHROID) 175 MCG tablet Take 175 mcg by mouth daily.  . metoprolol tartrate (LOPRESSOR) 25 MG tablet TAKE 1 TABLET BY MOUTH THREE TIMES A DAY  . nitrofurantoin (MACRODANTIN) 100 MG capsule Take 100 mg by mouth 4 (four) times daily.  Marland Kitchen olmesartan (BENICAR) 5 MG tablet Take 2 tablets (10 mg total) by mouth daily.  . rosuvastatin (CRESTOR) 10 MG tablet TAKE ONE HALF TABLET BY MOUTH ONCE A DAY.  Marland Kitchen VITAMIN D PO Take 5,000 Units by mouth daily.   Alveda Reasons 20 MG TABS tablet TAKE 1 TABLET BY MOUTH EVERY DAY IN THE EVENING    Radiology:   No results found.  Cardiac Studies:  Direct-current  cardioversion 02/26/2020:  A flutter to A. fib to normal sinus rhythm with 75x 1 then 150 Joules of electricity X 1  PCV ECHOCARDIOGRAM COMPLETE 02/19/2020 Narrative Echocardiogram 02/19/2020: Normal LV systolic function with EF 58%. Left ventricle cavity is normal in size. Normal global wall motion. Indeterminate diastolic filling pattern. Calculated EF 58%. Left atrial cavity is mildly dilated. Structurally normal mitral valve.  Mild (Grade I) mitral regurgitation. Structurally normal tricuspid valve with trace regurgitation. No evidence of pulmonary hypertension. Patient appeared to be in atrial flutter during examination. Compared to 03/19/2015, rhythm abnormal now.  Sleep Study 2006: Used CPAP. Repeat sleep study. 05/28/2015 by Dr. Caryl Comes: No e/o sleep apnea.  Chest x-ray 04/30/2015: Mild CHF, atherosclerotic calcification the aortic arch.  Direct current cardioversion 04/29/2015: A. Flutter to NSR with 75J x 1.  Treadmill Exercise stress 10/11/2014: Indication: Palpitations, Chest pain The patient exercised according to Bruce Protocol, Total time recorded 04:35min achieving max heart rate of 141 which was 90% of MPHR for age and 5.80 METS of work. Normal BP response. Resting ECG NSR, low voltage complexes. There was no ST-T changes of ischemia with exercise stress test. Stress  terminated due to THR (>85% MPHR)/MPHR met and fatigue. Decreased exercise tolerence.   EKG:   EKG 03/03/2020: Atrial fibrillation with controlled ventricular response at a rate of 94 bpm.  Normal axis.  Incomplete right bundle branch block.  Poor R wave progression cannot exclude anteroseptal infarct old.  Compared to EKG 02/14/2020, ventricular rate has improved.  EKG 07/27/2019: Normal sinus rhythm with rate of 64 bpm, left abnormality, normal axis.  No evidence of ischemia, normal EKG.   No significant change from EKG 01/23/2019.   Assessment:     ICD-10-CM   1. Paroxysmal atrial flutter (HCC) CHA2DS2-VASc Score is 3.  Yearly risk of stroke: 3.2% (A, F, HTN).  I48.92 EKG 12-Lead    PCV MYOCARDIAL PERFUSION WO LEXISCAN  2. Essential hypertension  I10   3. Shortness of breath  R06.02 PCV MYOCARDIAL PERFUSION WO LEXISCAN  4. Dizziness  R42    No orders of the defined types were placed in this encounter.  Medications Discontinued During This Encounter  Medication Reason  . olmesartan (BENICAR) 5 MG tablet Side effect (s)   This patients CHA2DS2-VASc Score 3 (HTN, Age, F) and yearly risk of stroke 3.2%.   Recommendations:    [name] [name]  is a 69 y.o.  with paroxysmal atypical atrial flutter, hyperlipidemia, morbid obesity and hypertension. She was found to be in atrial flutter with rapid ventricular response on 03/13/2015. It was felt to be atypical atrial flutter and was evaluated by Dr. [name], recommended cardioversion, successful direct current cardioversion on 04/29/2015 with no recurrence.  02/14/2020 patient presented in atrial fibrillation with rapid ventricular response and underwent successful cardioversion 02/26/2020.  Patient presents today for urgent visit with complaints of dizziness, palpitations, shortness of breath.  She underwent successful cardioversion 02/26/2020, however EKG today revealed patient to be in atrial fibrillation with controlled ventricular  response.  She is currently tolerating Xarelto without bleeding diathesis, will continue this.  Will also continue metoprolol titrate 50 mg 3 times daily for rate control.  Patient will likely benefit from initiation of antiarrhythmic medication.  However most recent stress test available for review was from 2016.  We will first obtain nuclear stress test.  Results of nuclear stress testing will inform antiarrhythmic selection.  Would like to start her on flecainide, pending stress test results.  One week following  initiation of antiarrhythmic medication we will schedule patient for repeat cardioversion.  Patient is to maintain sinus rhythm following cardioversion with antiarrhythmic, could consider referral to electrophysiology in the future.  In regard to hypertension, patient has stopped her olmesartan at the direction of Dr. Einar Gip due to dizziness which she attributed to low blood pressure.  Home blood pressure readings are not at goal.  However patient wishes not to start additional antihypertensive medications at this time due to dizziness.  Encourage patient to continue to monitor blood pressure at home.  Will reevaluate antihypertensive medications and blood pressure control at next visit.  Again discussed with patient regarding diet and lifestyle modifications to reduce cardiovascular risks.  Stressed the importance of weight loss to reduce risk of recurrence of atrial fibrillation and improve blood pressure control.  Follow-up in 3 to 4 weeks following stress test and cardioversion.  Patient was seen in collaboration with Dr. Einar Gip. He also reviewed patient's chart and Dr. Einar Gip is in agreement of the plan.    Alethia Berthold, PA-C 03/03/2020, 2:31 PM Office: [phone]

## 2020-03-03 NOTE — Progress Notes (Signed)
Primary Physician:  Burnard Bunting, MD  Patient ID: [name] [name], female    DOB: 1951-04-05, 69 y.o.   MRN: [id number]  Subjective:   Chief Complaint  Patient presents with  . Atrial Fibrillation  . Follow-up   HPI: [name] [name]  is a 69 y.o. female  with paroxysmal atypical atrial flutter, hyperlipidemia, morbid obesity and hypertension. She was found to be in atrial flutter with rapid ventricular response on 03/13/2015. It was felt to be atypical atrial flutter and was evaluated by Dr. [name], recommended cardioversion, successful direct current cardioversion on 04/29/2015 with no recurrence.  02/14/2020 patient presented in atrial fibrillation with rapid ventricular response and underwent successful cardioversion 02/26/2020.  Patient presents for follow-up after successful cardioversion 02/26/2020.  She called the office on-call over the weekend with complaints of dizziness and lightheadedness, she thinks she may be back in atrial fibrillation.  Patient states following her cardioversion 02/26/2020 she relaxed for 2 days and then went out walking and shopping 2 days ago without issue.  However yesterday in the evening she noticed palpitations, dizziness, and shortness of breath.  Denies chest pain, orthopnea, PND, syncope, near syncope.  She continues to tolerate Xarelto without bleeding diathesis.  Following her cardioversion she stopped her olmesartan due to dizziness which she attributed to low blood pressure.  Since stopping olmesartan at home blood pressure readings are 938-101 systolic and 75Z diastolic.  Past Medical History:  Diagnosis Date  . Anxiety and depression   . 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 Exogenous obesity   . Fibromyalgia   . Hyperlipidemia   . Hypothyroidism   . Morbid obesity ([place])   . OSA (obstructive sleep apnea)    uses CPAP  . Palpitations 07/13/2018  . Paroxysmal atrial flutter ([place])   . Periodic limb movement disorder   . REM  sleep behavior disorder   . Rhinitis   . SOB (shortness of breath)   . Spinal stenosis   . Unspecified hypothyroidism     Past Surgical History:  Procedure Laterality Date  . CARDIOVERSION N/A 04/29/2015   Procedure: CARDIOVERSION;  Surgeon: Adrian Prows, MD;  Location: [place];  Service: Cardiovascular;  Laterality: N/A;  . CARDIOVERSION N/A 02/26/2020   Procedure: CARDIOVERSION;  Surgeon: Adrian Prows, MD;  Location: [place];  Service: Cardiovascular;  Laterality: N/A;  . CARPAL TUNNEL RELEASE     Bilateral  . DILATION AND CURETTAGE OF UTERUS    . left rotator cuff    . resection uterine polyp  2011   Social History   Tobacco Use  . Smoking status: Never Smoker  . Smokeless tobacco: Never Used  Substance Use Topics  . Alcohol use: No    Review of Systems  Constitutional: Negative for malaise/fatigue.  Cardiovascular: Positive for dyspnea on exertion and palpitations. Negative for chest pain, claudication, leg swelling, near-syncope, orthopnea, paroxysmal nocturnal dyspnea and syncope.  Respiratory: Positive for shortness of breath.   Hematologic/Lymphatic: Does not bruise/bleed easily.  Musculoskeletal: Positive for arthritis (left hip) and back pain.  Gastrointestinal: Negative for melena.  Neurological: Positive for dizziness.   Objective:  Blood pressure 138/79, pulse (!) 41, resp. rate 16, height $RemoveBe'5\' 6"'gaPgCujYM$  (1.676 m), weight 259 lb (117.5 kg), SpO2 96 %. Body mass index is 41.8 kg/m.   Vitals with BMI 03/03/2020 02/26/2020 02/26/2020  Height $Remov'5\' 6"'ZmyoaJ$  - -  Weight 259 lbs - -  BMI 02.58 - -  Systolic 527 782 423  Diastolic 79 48 52  Pulse 41 79 78    Orthostatic VS for the past 72 hrs (Last 3 readings):  Patient Position BP Location Cuff Size  03/03/20 1306 Sitting Left Arm Large    Physical Exam Constitutional:      Comments: Morbidly obese  Neck:     Comments: Short neck and difficult to evaluate JVP Cardiovascular:     Rate and Rhythm: Tachycardia  present. Rhythm irregularly irregular.     Pulses: Intact distal pulses.     Heart sounds: Normal heart sounds. No murmur heard.  No gallop.      Comments: Femoral and popliteal pulse difficult to feel due to patient's body habitus.  No JVD. No leg edema Pulmonary:     Effort: Pulmonary effort is normal.     Breath sounds: Normal breath sounds.  Abdominal:     General: Bowel sounds are normal.     Palpations: Abdomen is soft.     Comments: Obese    Laboratory examination:   CMP Latest Ref Rng & Units 02/14/2020 11/14/2018 06/09/2018  Glucose 65 - 99 mg/dL 112(H) 110(H) 79  BUN 8 - 27 mg/dL $Remove'23 17 17  'Qvclxhs$ Creatinine 0.57 - 1.00 mg/dL 0.90 0.90 0.86  Sodium 134 - 144 mmol/L 140 143 142  Potassium 3.5 - 5.2 mmol/L 4.7 4.4 4.3  Chloride 96 - 106 mmol/L 101 106 102  CO2 20 - 29 mmol/L $RemoveB'24 23 24  'jlnfpCmZ$ Calcium 8.7 - 10.3 mg/dL 9.6 9.0 9.2  Total Protein 6.0 - 8.5 g/dL - 6.9 -  Total Bilirubin 0.0 - 1.2 mg/dL - 0.3 -  Alkaline Phos 39 - 117 IU/L - 47 -  AST 0 - 40 IU/L - 17 -  ALT 0 - 32 IU/L - 12 -   CBC Latest Ref Rng & Units 02/14/2020 11/14/2018 06/09/2018  WBC 3.4 - 10.8 x10E3/uL 10.2 5.9 6.4  Hemoglobin 11.1 - 15.9 g/dL 13.4 12.5 13.1  Hematocrit 34.0 - 46.6 % 41.2 37.3 39.4  Platelets 150 - 450 x10E3/uL 196 178 214   Lipid Panel     Component Value Date/Time   CHOL 129 11/14/2018 0830   TRIG 83 11/14/2018 0830   HDL 49 11/14/2018 0830   LDLCALC 63 11/14/2018 0830   HEMOGLOBIN A1C No results found for: HGBA1C, MPG TSH No results for input(s): TSH in the last 8760 hours.  External labs:  02/27/2019: HDL 46, LDL 63, triglycerides 76, total cholesterol 124 BUN 14, creatinine 0.90, TSH 0.84  11/21/2017: Cholesterol 133, triglycerides 88, HDL 47, LDL 88.  Creatinine 0.94, EGFR 63/72, potassium 4.7, CMP normal.  CBC normal.   Current Meds  Medication Sig  . Apple Cider Vinegar 188 MG CAPS Take by mouth.  . Ascorbic Acid (VITAMIN C) 100 MG tablet Take 100 mg by mouth daily.  Marland Kitchen  levothyroxine (SYNTHROID) 175 MCG tablet Take 175 mcg by mouth daily.  . metoprolol tartrate (LOPRESSOR) 25 MG tablet TAKE 1 TABLET BY MOUTH THREE TIMES A DAY  . nitrofurantoin (MACRODANTIN) 100 MG capsule Take 100 mg by mouth 4 (four) times daily.  Marland Kitchen olmesartan (BENICAR) 5 MG tablet Take 2 tablets (10 mg total) by mouth daily.  . rosuvastatin (CRESTOR) 10 MG tablet TAKE ONE HALF TABLET BY MOUTH ONCE A DAY.  Marland Kitchen VITAMIN D PO Take 5,000 Units by mouth daily.   Alveda Reasons 20 MG TABS tablet TAKE 1 TABLET BY MOUTH EVERY DAY IN THE EVENING    Radiology:   No results found.  Cardiac Studies:  Direct-current  cardioversion 02/26/2020:  A flutter to A. fib to normal sinus rhythm with 75x 1 then 150 Joules of electricity X 1  PCV ECHOCARDIOGRAM COMPLETE 02/19/2020 Narrative Echocardiogram 02/19/2020: Normal LV systolic function with EF 58%. Left ventricle cavity is normal in size. Normal global wall motion. Indeterminate diastolic filling pattern. Calculated EF 58%. Left atrial cavity is mildly dilated. Structurally normal mitral valve.  Mild (Grade I) mitral regurgitation. Structurally normal tricuspid valve with trace regurgitation. No evidence of pulmonary hypertension. Patient appeared to be in atrial flutter during examination. Compared to 03/19/2015, rhythm abnormal now.  Sleep Study 2006: Used CPAP. Repeat sleep study. 05/28/2015 by Dr. Caryl Comes: No e/o sleep apnea.  Chest x-ray 04/30/2015: Mild CHF, atherosclerotic calcification the aortic arch.  Direct current cardioversion 04/29/2015: A. Flutter to NSR with 75J x 1.  Treadmill Exercise stress 10/11/2014: Indication: Palpitations, Chest pain The patient exercised according to Bruce Protocol, Total time recorded 04:53min achieving max heart rate of 141 which was 90% of MPHR for age and 5.80 METS of work. Normal BP response. Resting ECG NSR, low voltage complexes. There was no ST-T changes of ischemia with exercise stress test. Stress  terminated due to THR (>85% MPHR)/MPHR met and fatigue. Decreased exercise tolerence.   EKG:   EKG 03/03/2020: Atrial fibrillation with controlled ventricular response at a rate of 94 bpm.  Normal axis.  Incomplete right bundle branch block.  Poor R wave progression cannot exclude anteroseptal infarct old.  Compared to EKG 02/14/2020, ventricular rate has improved.  EKG 07/27/2019: Normal sinus rhythm with rate of 64 bpm, left abnormality, normal axis.  No evidence of ischemia, normal EKG.   No significant change from EKG 01/23/2019.   Assessment:     ICD-10-CM   1. Paroxysmal atrial flutter (HCC) CHA2DS2-VASc Score is 3.  Yearly risk of stroke: 3.2% (A, F, HTN).  I48.92 EKG 12-Lead    PCV MYOCARDIAL PERFUSION WO LEXISCAN  2. Essential hypertension  I10   3. Shortness of breath  R06.02 PCV MYOCARDIAL PERFUSION WO LEXISCAN  4. Dizziness  R42    No orders of the defined types were placed in this encounter.  Medications Discontinued During This Encounter  Medication Reason  . olmesartan (BENICAR) 5 MG tablet Side effect (s)   This patients CHA2DS2-VASc Score 3 (HTN, Age, F) and yearly risk of stroke 3.2%.   Recommendations:    JANARI GAGNER  is a 69 y.o.  with paroxysmal atypical atrial flutter, hyperlipidemia, morbid obesity and hypertension. She was found to be in atrial flutter with rapid ventricular response on 03/13/2015. It was felt to be atypical atrial flutter and was evaluated by Dr. [name], recommended cardioversion, successful direct current cardioversion on 04/29/2015 with no recurrence.  02/14/2020 patient presented in atrial fibrillation with rapid ventricular response and underwent successful cardioversion 02/26/2020.  Patient presents today for urgent visit with complaints of dizziness, palpitations, shortness of breath.  She underwent successful cardioversion 02/26/2020, however EKG today revealed patient to be in atrial fibrillation with controlled ventricular  response.  She is currently tolerating Xarelto without bleeding diathesis, will continue this.  Will also continue metoprolol titrate 50 mg 3 times daily for rate control.  Patient will likely benefit from initiation of antiarrhythmic medication.  However most recent stress test available for review was from 2016.  We will first obtain nuclear stress test.  Results of nuclear stress testing will inform antiarrhythmic selection.  Would like to start her on flecainide, pending stress test results.  One week following  initiation of antiarrhythmic medication we will schedule patient for repeat cardioversion.  Patient is to maintain sinus rhythm following cardioversion with antiarrhythmic, could consider referral to electrophysiology in the future.  In regard to hypertension, patient has stopped her olmesartan at the direction of Dr. Einar Gip due to dizziness which she attributed to low blood pressure.  Home blood pressure readings are not at goal.  However patient wishes not to start additional antihypertensive medications at this time due to dizziness.  Encourage patient to continue to monitor blood pressure at home.  Will reevaluate antihypertensive medications and blood pressure control at next visit.  Again discussed with patient regarding diet and lifestyle modifications to reduce cardiovascular risks.  Stressed the importance of weight loss to reduce risk of recurrence of atrial fibrillation and improve blood pressure control.  Follow-up in 3 to 4 weeks following stress test and cardioversion.  Patient was seen in collaboration with Dr. Einar Gip. He also reviewed patient's chart and Dr. Einar Gip is in agreement of the plan.    Alethia Berthold, PA-C 03/03/2020, 2:31 PM Office: [phone]

## 2020-03-04 ENCOUNTER — Ambulatory Visit: Payer: PPO | Admitting: Student

## 2020-03-05 ENCOUNTER — Ambulatory Visit: Payer: PPO

## 2020-03-05 ENCOUNTER — Telehealth: Payer: Self-pay | Admitting: Student

## 2020-03-05 ENCOUNTER — Other Ambulatory Visit: Payer: Self-pay

## 2020-03-05 DIAGNOSIS — I4892 Unspecified atrial flutter: Secondary | ICD-10-CM

## 2020-03-05 DIAGNOSIS — R0602 Shortness of breath: Secondary | ICD-10-CM

## 2020-03-05 MED ORDER — MULTAQ 400 MG PO TABS: 400.0000 mg | ORAL_TABLET | Freq: Two times a day (BID) | ORAL | 3 refills | Status: DC

## 2020-03-05 NOTE — Telephone Encounter (Signed)
Patient presented to the office today for stress testing. However she was noted to be in atrial fibrillation with RVR . Will start patient on Multaq and reschedule stress test at a later date.

## 2020-03-05 NOTE — Interval H&P Note (Signed)
History and Physical Interval Note:  03/05/2020 9:28 PM  [name] [name]  has presented today for surgery, with the diagnosis of A-FIB.  The various methods of treatment have been discussed with the patient and family. After consideration of risks, benefits and other options for treatment, the patient has consented to  Procedure(s): CARDIOVERSION (N/A) as a surgical intervention.  The patient's history has been reviewed, patient examined, no change in status, stable for surgery.  I have reviewed the patient's chart and labs.  Questions were answered to the patient's satisfaction.     Yates Decamp

## 2020-03-06 DIAGNOSIS — N390 Urinary tract infection, site not specified: Secondary | ICD-10-CM | POA: Diagnosis not present

## 2020-03-07 ENCOUNTER — Telehealth: Payer: Self-pay

## 2020-03-07 NOTE — Telephone Encounter (Signed)
I spoke with Dr. Jacinto Halim, he recommends I discuss with the insurance company, like an appeal? Can you get me info on how to go about doing this?

## 2020-03-07 NOTE — Telephone Encounter (Signed)
Patient called and said her Multaq was $175 for one month. I tried to do a PA but it was not needed. She is in the donut hole with her insurance. Please advise.

## 2020-03-08 ENCOUNTER — Other Ambulatory Visit (HOSPITAL_COMMUNITY)
Admission: RE | Admit: 2020-03-08 | Discharge: 2020-03-08 | Disposition: A | Discharge status: Home / Self Care | Payer: PPO | Source: Ambulatory Visit | Attending: Cardiology | Admitting: Cardiology

## 2020-03-08 DIAGNOSIS — Z01812 Encounter for preprocedural laboratory examination: Secondary | ICD-10-CM | POA: Diagnosis not present

## 2020-03-08 DIAGNOSIS — Z20822 Contact with and (suspected) exposure to covid-19: Secondary | ICD-10-CM | POA: Diagnosis not present

## 2020-03-08 LAB — SARS CORONAVIRUS 2 (TAT 6-24 HRS): SARS Coronavirus 2: NEGATIVE

## 2020-03-10 NOTE — Anesthesia Preprocedure Evaluation (Addendum)
Anesthesia Evaluation  Patient identified by MRN, date of birth, ID band Patient awake    Reviewed: Allergy & Precautions, NPO status , Patient's Chart, lab work & pertinent test results  Airway Mallampati: II  TM Distance: >3 FB Neck ROM: Full    Dental no notable dental hx. (+) Teeth Intact, Dental Advisory Given   Pulmonary    Pulmonary exam normal breath sounds clear to auscultation       Cardiovascular Exercise Tolerance: Good hypertension, Pt. on medications and Pt. on home beta blockers Normal cardiovascular exam+ dysrhythmias Atrial Fibrillation  Rhythm:Irregular Rate:Abnormal  10/19 TTE Normal LV systolic function with EF 58%. Left ventricle cavity is normal  in size. Normal global wall motion. Indeterminate diastolic filling  pattern. Calculated EF 58%.  Left atrial cavity is mildly dilated.  Structurally normal mitral valve. Mild (Grade I) mitral regurgitation.    Neuro/Psych Anxiety Depression  Neuromuscular disease    GI/Hepatic Neg liver ROS,   Endo/Other  Hypothyroidism Morbid obesity  Renal/GU K+ 4.7 Cr 0.90     Musculoskeletal  (+) Fibromyalgia -  Abdominal (+) + obese,   Peds  Hematology Hgb 13.4   Anesthesia Other Findings On xarelto  Reproductive/Obstetrics                            Anesthesia Physical Anesthesia Plan  ASA: III  Anesthesia Plan: General   Post-op Pain Management:    Induction: Intravenous  PONV Risk Score and Plan: Treatment may vary due to age or medical condition and Ondansetron  Airway Management Planned: Natural Airway and Nasal Cannula  Additional Equipment: None  Intra-op Plan:   Post-operative Plan:   Informed Consent: I have reviewed the patients History and Physical, chart, labs and discussed the procedure including the risks, benefits and alternatives for the proposed anesthesia with the patient or authorized representative  who has indicated his/her understanding and acceptance.     Dental advisory given  Plan Discussed with: CRNA and Anesthesiologist  Anesthesia Plan Comments:        Anesthesia Quick Evaluation

## 2020-03-10 NOTE — Telephone Encounter (Signed)
Okay thank you! I think we will see how cardioversion goes tomorrow. We will likely try to get stress test done before she runs out of multaq and switch to something else.

## 2020-03-11 ENCOUNTER — Encounter (HOSPITAL_COMMUNITY): Payer: Self-pay | Admitting: Cardiology

## 2020-03-11 ENCOUNTER — Other Ambulatory Visit: Payer: Self-pay

## 2020-03-11 ENCOUNTER — Encounter (HOSPITAL_COMMUNITY)
Admission: RE | Disposition: A | Discharge status: Home / Self Care | Payer: Self-pay | Source: Home / Self Care | Attending: Cardiology

## 2020-03-11 ENCOUNTER — Other Ambulatory Visit: Payer: Self-pay | Admitting: Cardiology

## 2020-03-11 ENCOUNTER — Ambulatory Visit (HOSPITAL_COMMUNITY): Payer: PPO | Admitting: Anesthesiology

## 2020-03-11 ENCOUNTER — Ambulatory Visit (HOSPITAL_COMMUNITY)
Admission: RE | Admit: 2020-03-11 | Discharge: 2020-03-11 | Disposition: A | Discharge status: Home / Self Care | Payer: PPO | Attending: Cardiology | Admitting: Cardiology

## 2020-03-11 DIAGNOSIS — Z6841 Body Mass Index (BMI) 40.0 and over, adult: Secondary | ICD-10-CM | POA: Diagnosis not present

## 2020-03-11 DIAGNOSIS — R0602 Shortness of breath: Secondary | ICD-10-CM | POA: Diagnosis not present

## 2020-03-11 DIAGNOSIS — R42 Dizziness and giddiness: Secondary | ICD-10-CM | POA: Insufficient documentation

## 2020-03-11 DIAGNOSIS — I484 Atypical atrial flutter: Secondary | ICD-10-CM | POA: Diagnosis not present

## 2020-03-11 DIAGNOSIS — Z7901 Long term (current) use of anticoagulants: Secondary | ICD-10-CM | POA: Diagnosis not present

## 2020-03-11 DIAGNOSIS — I4819 Other persistent atrial fibrillation: Secondary | ICD-10-CM

## 2020-03-11 DIAGNOSIS — I4892 Unspecified atrial flutter: Secondary | ICD-10-CM | POA: Insufficient documentation

## 2020-03-11 DIAGNOSIS — I4891 Unspecified atrial fibrillation: Secondary | ICD-10-CM | POA: Diagnosis not present

## 2020-03-11 DIAGNOSIS — E785 Hyperlipidemia, unspecified: Secondary | ICD-10-CM | POA: Diagnosis not present

## 2020-03-11 DIAGNOSIS — I10 Essential (primary) hypertension: Secondary | ICD-10-CM | POA: Insufficient documentation

## 2020-03-11 DIAGNOSIS — Z79899 Other long term (current) drug therapy: Secondary | ICD-10-CM | POA: Diagnosis not present

## 2020-03-11 DIAGNOSIS — E875 Hyperkalemia: Secondary | ICD-10-CM

## 2020-03-11 DIAGNOSIS — F418 Other specified anxiety disorders: Secondary | ICD-10-CM | POA: Diagnosis not present

## 2020-03-11 HISTORY — PX: CARDIOVERSION: SHX1299

## 2020-03-11 LAB — POCT I-STAT, CHEM 8
BUN: 17 mg/dL (ref 8–23)
Calcium, Ion: 1.18 mmol/L (ref 1.15–1.40)
Chloride: 106 mmol/L (ref 98–111)
Creatinine, Ser: 1.1 mg/dL — ABNORMAL HIGH (ref 0.44–1.00)
Glucose, Bld: 101 mg/dL — ABNORMAL HIGH (ref 70–99)
HCT: 41 % (ref 36.0–46.0)
Hemoglobin: 13.9 g/dL (ref 12.0–15.0)
Potassium: 4.1 mmol/L (ref 3.5–5.1)
Sodium: 143 mmol/L (ref 135–145)
TCO2: 24 mmol/L (ref 22–32)

## 2020-03-11 SURGERY — CARDIOVERSION
Anesthesia: General

## 2020-03-11 MED ORDER — SODIUM CHLORIDE 0.9 % IV SOLN: INTRAVENOUS | Status: DC | PRN

## 2020-03-11 MED ORDER — LIDOCAINE 2% (20 MG/ML) 5 ML SYRINGE
INTRAMUSCULAR | Status: DC | PRN
  Administered 2020-03-11: 100 mg via INTRAVENOUS

## 2020-03-11 MED ORDER — PROPOFOL 10 MG/ML IV BOLUS
INTRAVENOUS | Status: DC | PRN
  Administered 2020-03-11: 80 mg via INTRAVENOUS

## 2020-03-11 NOTE — CV Procedure (Signed)
Direct current cardioversion: 03/11/20   Indication symptomatic A. Fibrillation.  Procedure: Using 80 mg of IV Propofol and 100 IV Lidocaine (for reducing venous pain) for achieving deep sedation, synchronized direct current cardioversion performed. Patient was delivered with 120 Joules of electricity X 1 with success to NSR. Patient tolerated the procedure well. No immediate complication noted.     Yates Decamp, MD, [facility] 03/11/2020, 10:39 AM Office: [phone] Pager: [phone]

## 2020-03-11 NOTE — Anesthesia Postprocedure Evaluation (Signed)
Anesthesia Post Note  Patient: [name] [name]  Procedure(s) Performed: CARDIOVERSION (N/A )     Patient location during evaluation: Endoscopy Anesthesia Type: General Level of consciousness: awake and alert Pain management: pain level controlled Vital Signs Assessment: post-procedure vital signs reviewed and stable Respiratory status: spontaneous breathing, nonlabored ventilation, respiratory function stable and patient connected to nasal cannula oxygen Cardiovascular status: blood pressure returned to baseline and stable Postop Assessment: no apparent nausea or vomiting Anesthetic complications: no   No complications documented.  Last Vitals:  Vitals:   03/11/20 1050 03/11/20 1055  BP:  (!) 128/52  Pulse: (!) 58 64  Resp: 11 16  Temp:    SpO2: 96% 99%    Last Pain:  Vitals:   03/11/20 1055  TempSrc:   PainSc: 0-No pain                 Trevor Iha

## 2020-03-11 NOTE — Transfer of Care (Signed)
Immediate Anesthesia Transfer of Care Note  Patient: [name] [name]  Procedure(s) Performed: CARDIOVERSION (N/A )  Patient Location: PACU and Endoscopy Unit  Anesthesia Type:General  Level of Consciousness: patient cooperative and responds to stimulation  Airway & Oxygen Therapy: Patient Spontanous Breathing  Post-op Assessment: Report given to RN and Post -op Vital signs reviewed and stable  Post vital signs: Reviewed and stable  Last Vitals:  Vitals Value Taken Time  BP    Temp    Pulse    Resp    SpO2      Last Pain:  Vitals:   03/11/20 0932  TempSrc: Oral  PainSc: 0-No pain         Complications: No complications documented.

## 2020-03-11 NOTE — Interval H&P Note (Signed)
History and Physical Interval Note:  03/11/2020 10:32 AM  [name] [name]  has presented today for surgery, with the diagnosis of AFIB.  The various methods of treatment have been discussed with the patient and family. After consideration of risks, benefits and other options for treatment, the patient has consented to  Procedure(s): CARDIOVERSION (N/A) as a surgical intervention.  The patient's history has been reviewed, patient examined, no change in status, stable for surgery.  I have reviewed the patient's chart and labs.  Questions were answered to the patient's satisfaction.     Yates Decamp

## 2020-03-12 ENCOUNTER — Encounter (HOSPITAL_COMMUNITY): Payer: Self-pay | Admitting: Cardiology

## 2020-03-13 LAB — POCT I-STAT, CHEM 8
BUN: 24 mg/dL — ABNORMAL HIGH (ref 8–23)
Calcium, Ion: 1.02 mmol/L — ABNORMAL LOW (ref 1.15–1.40)
Chloride: 106 mmol/L (ref 98–111)
Creatinine, Ser: 1.1 mg/dL — ABNORMAL HIGH (ref 0.44–1.00)
Glucose, Bld: 111 mg/dL — ABNORMAL HIGH (ref 70–99)
HCT: 39 % (ref 36.0–46.0)
Hemoglobin: 13.3 g/dL (ref 12.0–15.0)
Potassium: 6.5 mmol/L (ref 3.5–5.1)
Sodium: 137 mmol/L (ref 135–145)
TCO2: 26 mmol/L (ref 22–32)

## 2020-03-17 DIAGNOSIS — E785 Hyperlipidemia, unspecified: Secondary | ICD-10-CM | POA: Diagnosis not present

## 2020-03-24 DIAGNOSIS — I480 Paroxysmal atrial fibrillation: Secondary | ICD-10-CM | POA: Diagnosis not present

## 2020-03-24 DIAGNOSIS — Z0000 Encounter for general adult medical examination without abnormal findings: Secondary | ICD-10-CM | POA: Diagnosis not present

## 2020-03-24 DIAGNOSIS — E039 Hypothyroidism, unspecified: Secondary | ICD-10-CM | POA: Diagnosis not present

## 2020-03-24 DIAGNOSIS — R82998 Other abnormal findings in urine: Secondary | ICD-10-CM | POA: Diagnosis not present

## 2020-03-24 DIAGNOSIS — M5416 Radiculopathy, lumbar region: Secondary | ICD-10-CM | POA: Diagnosis not present

## 2020-03-24 NOTE — Progress Notes (Signed)
Primary Physician:  Burnard Bunting, MD  Patient ID: [name] [name], female    DOB: Sep 17, 1950, 69 y.o.   MRN: [id number]  Subjective:   No chief complaint on file.  HPI: [name] [name]  is a 69 y.o. female  with paroxysmal atypical atrial flutter, hyperlipidemia, morbid obesity and hypertension. She was found to be in atrial flutter with rapid ventricular response on 03/13/2015. It was felt to be atypical atrial flutter and was evaluated by Dr. [name], recommended cardioversion, successful direct current cardioversion on 04/29/2015 with no recurrence.  02/14/2020 patient presented in atrial fibrillation with rapid ventricular response and underwent successful cardioversion 02/26/2020. She again underwent successful cardioversion from atrial fibrillation to sinus rhythm on 03/11/2020.   Patient was previously scheduled in our office for a stress test however upon presentation for the stress test she was in atrial fibrillation with RVR. At this time she was started on Multaq and scheduled for cardioversion.   Patient presents for follow up of atrial fibrillation and hypertension after cardioversion. She is presently doing well. She denies chest pain, palpitations, fatigue, dizziness, dyspnea, orthopnea, PND, syncope, near syncope. She is tolerating Xarelto without bleeding diathesis. Patient brings with her a log of home blood pressure readings 120s/70s. She is presently tolerating Multaq well, however cost is prohibitive of Multaq as a long-term antiarrythmic option.  Past Medical History:  Diagnosis Date  . Anxiety and depression   . 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 Exogenous obesity   . Fibromyalgia   . Hyperlipidemia   . Hypothyroidism   . Morbid obesity ([place])   . OSA (obstructive sleep apnea)    uses CPAP  . Palpitations 07/13/2018  . Paroxysmal atrial flutter ([place])   . Periodic limb movement disorder   . REM sleep behavior disorder   . Rhinitis   . SOB  (shortness of breath)   . Spinal stenosis   . Unspecified hypothyroidism     Past Surgical History:  Procedure Laterality Date  . CARDIOVERSION N/A 04/29/2015   Procedure: CARDIOVERSION;  Surgeon: Adrian Prows, MD;  Location: [facility] ENDOSCOPY;  Service: Cardiovascular;  Laterality: N/A;  . CARDIOVERSION N/A 02/26/2020   Procedure: CARDIOVERSION;  Surgeon: Adrian Prows, MD;  Location: [facility] ENDOSCOPY;  Service: Cardiovascular;  Laterality: N/A;  . CARDIOVERSION N/A 03/11/2020   Procedure: CARDIOVERSION;  Surgeon: Adrian Prows, MD;  Location: [facility];  Service: Cardiovascular;  Laterality: N/A;  . CARPAL TUNNEL RELEASE     Bilateral  . DILATION AND CURETTAGE OF UTERUS    . left rotator cuff    . resection uterine polyp  2011   Social History   Tobacco Use  . Smoking status: Never Smoker  . Smokeless tobacco: Never Used  Substance Use Topics  . Alcohol use: No    Review of Systems  Constitutional: Negative for malaise/fatigue and weight gain.  Cardiovascular: Negative for chest pain, claudication, dyspnea on exertion, leg swelling, near-syncope, orthopnea, palpitations, paroxysmal nocturnal dyspnea and syncope.  Respiratory: Negative for shortness of breath.   Hematologic/Lymphatic: Does not bruise/bleed easily.  Musculoskeletal: Positive for arthritis (left hip) and back pain.  Gastrointestinal: Negative for melena.  Neurological: Negative for dizziness and weakness.   Objective:  There were no vitals taken for this visit. There is no height or weight on file to calculate BMI.   Vitals with BMI 03/11/2020 03/11/2020 03/11/2020  Height - - -  Weight - - -  BMI - - -  Systolic 573 - 220  Diastolic  56 - 52  Pulse 67 66 64    No data found.  Physical Exam Constitutional:      Comments: Morbidly obese  Neck:     Comments: Short neck and difficult to evaluate JVP Cardiovascular:     Rate and Rhythm: Regular rhythm. Bradycardia present.     Pulses: Intact distal pulses.     Heart  sounds: Normal heart sounds. No murmur heard.  No gallop.      Comments: Femoral and popliteal pulse difficult to feel due to patient's body habitus.  No JVD. No leg edema Pulmonary:     Effort: Pulmonary effort is normal.     Breath sounds: Normal breath sounds.  Abdominal:     General: Bowel sounds are normal.     Palpations: Abdomen is soft.     Comments: Obese    Laboratory examination:   CMP Latest Ref Rng & Units 03/11/2020 03/11/2020 02/14/2020  Glucose 70 - 99 mg/dL 101(H) 111(H) 112(H)  BUN 8 - 23 mg/dL 17 24(H) 23  Creatinine 0.44 - 1.00 mg/dL 1.10(H) 1.10(H) 0.90  Sodium 135 - 145 mmol/L 143 137 140  Potassium 3.5 - 5.1 mmol/L 4.1 6.5(HH) 4.7  Chloride 98 - 111 mmol/L 106 106 101  CO2 20 - 29 mmol/L - - 24  Calcium 8.7 - 10.3 mg/dL - - 9.6  Total Protein 6.0 - 8.5 g/dL - - -  Total Bilirubin 0.0 - 1.2 mg/dL - - -  Alkaline Phos 39 - 117 IU/L - - -  AST 0 - 40 IU/L - - -  ALT 0 - 32 IU/L - - -   CBC Latest Ref Rng & Units 03/11/2020 03/11/2020 02/14/2020  WBC 3.4 - 10.8 x10E3/uL - - 10.2  Hemoglobin 12.0 - 15.0 g/dL 13.9 13.3 13.4  Hematocrit 36 - 46 % 41.0 39.0 41.2  Platelets 150 - 450 x10E3/uL - - 196   Lipid Panel     Component Value Date/Time   CHOL 129 11/14/2018 0830   TRIG 83 11/14/2018 0830   HDL 49 11/14/2018 0830   LDLCALC 63 11/14/2018 0830   HEMOGLOBIN A1C No results found for: HGBA1C, MPG TSH No results for input(s): TSH in the last 8760 hours.  External labs:  03/17/2020:  BUN 13, creatinine 1.0, EGFR 54.8, sodium 139, potassium 4.6 Hemoglobin 12.9, hematocrit 39.7, platelets 185 Total cholesterol 108, triglycerides 57, HDL 44, LDL 53  02/27/2019: HDL 46, LDL 63, triglycerides 76, total cholesterol 124 BUN 14, creatinine 0.90, TSH 0.84  11/21/2017: Cholesterol 133, triglycerides 88, HDL 47, LDL 88.  Creatinine 0.94, EGFR 63/72, potassium 4.7, CMP normal.  CBC normal.   Current Meds  Medication Sig  . Apple Cider Vinegar 188 MG CAPS  Take by mouth.  . Ascorbic Acid (VITAMIN C) 100 MG tablet Take 100 mg by mouth daily.  Marland Kitchen levothyroxine (SYNTHROID) 175 MCG tablet Take 175 mcg by mouth daily.  . metoprolol tartrate (LOPRESSOR) 25 MG tablet TAKE 1 TABLET BY MOUTH THREE TIMES A DAY  . nitrofurantoin (MACRODANTIN) 100 MG capsule Take 100 mg by mouth 4 (four) times daily.  Marland Kitchen olmesartan (BENICAR) 5 MG tablet Take 2 tablets (10 mg total) by mouth daily.  . rosuvastatin (CRESTOR) 10 MG tablet TAKE ONE HALF TABLET BY MOUTH ONCE A DAY.  Marland Kitchen VITAMIN D PO Take 5,000 Units by mouth daily.   Alveda Reasons 20 MG TABS tablet TAKE 1 TABLET BY MOUTH EVERY DAY IN THE EVENING    Radiology:  No results found.  Cardiac Studies:   Direct-current cardioversion 03/11/2020: Converted to normal sinus rhythm with 120 J of electricity x1.  Direct-current cardioversion 02/26/2020:  A flutter to A. fib to normal sinus rhythm with 75x 1 then 150 Joules of electricity X 1  PCV ECHOCARDIOGRAM COMPLETE 02/19/2020 Narrative Echocardiogram 02/19/2020: Normal LV systolic function with EF 58%. Left ventricle cavity is normal in size. Normal global wall motion. Indeterminate diastolic filling pattern. Calculated EF 58%. Left atrial cavity is mildly dilated. Structurally normal mitral valve.  Mild (Grade I) mitral regurgitation. Structurally normal tricuspid valve with trace regurgitation. No evidence of pulmonary hypertension. Patient appeared to be in atrial flutter during examination. Compared to 03/19/2015, rhythm abnormal now.  Sleep Study 2006: Used CPAP. Repeat sleep study. 05/28/2015 by Dr. Caryl Comes: No e/o sleep apnea.  Chest x-ray 04/30/2015: Mild CHF, atherosclerotic calcification the aortic arch.  Direct current cardioversion 04/29/2015: A. Flutter to NSR with 75J x 1.  Treadmill Exercise stress 10/11/2014: Indication: Palpitations, Chest pain The patient exercised according to [facility] Protocol, Total time recorded 04:26mn achieving max heart  rate of 141 which was 90% of MPHR for age and 5.80 METS of work. Normal BP response. Resting ECG NSR, low voltage complexes. There was no ST-T changes of ischemia with exercise stress test. Stress terminated due to THR (>85% MPHR)/MPHR met and fatigue. Decreased exercise tolerence.   EKG:   EKG 03/25/2020: Sinus bradycardia at a rate of 59 bpm with a single PAC.  Normal axis.  Poor R wave progression, cannot exclude anteroseptal infarct old.  Incomplete right bundle branch block.  Compared to EKG 03/03/2020, now sinus rhythm.  EKG 03/03/2020: Atrial fibrillation with controlled ventricular response at a rate of 94 bpm.  Normal axis.  Incomplete right bundle branch block.  Poor R wave progression cannot exclude anteroseptal infarct old.  Compared to EKG 02/14/2020, ventricular rate has improved.  EKG 07/27/2019: Normal sinus rhythm with rate of 64 bpm, left abnormality, normal axis.  No evidence of ischemia, normal EKG.   No significant change from EKG 01/23/2019.   Assessment:     ICD-10-CM   1. Paroxysmal atrial fibrillation (HCC)  I48.0   2. Essential hypertension  I10    No orders of the defined types were placed in this encounter.  There are no discontinued medications. This patients CHA2DS2-VASc Score 3 (HTN, Age, F) and yearly risk of stroke 3.2%.   Recommendations:    [name] [name] is a 69y.o.  with paroxysmal atypical atrial flutter, hyperlipidemia, morbid obesity and hypertension. She was found to be in atrial flutter with rapid ventricular response on 03/13/2015. It was felt to be atypical atrial flutter and was evaluated by Dr. SVirl Axe recommended cardioversion, successful direct current cardioversion on 04/29/2015 with no recurrence.  02/14/2020 patient presented in atrial fibrillation with rapid ventricular response and underwent successful cardioversion 02/26/2020.  She again underwent successful cardioversion from atrial fibrillation to sinus rhythm on 03/11/2020.    Patient presents for follow-up of paroxysmal atrial fibrillation/atrial flutter and hypertension following cardioversion on 03/11/2020.  She is presently maintaining sinus rhythm.  She reports her symptoms of dizziness, palpitations, dizziness have all resolved since starting Multaq and having the cardioversion done.  She continues to tolerate Xarelto without bleeding diathesis.  We will continue Multaq, metoprolol tartrate, Xarelto at this time.  However patient reports that Multaq is cost prohibitive for her as a long-term option.  Discussed with patient that it is necessary to get stress testing done  in order to evaluate her for underlying coronary artery disease to further investigate etiology of atrial fibrillation/flutter as well as determine appropriate antiarrhythmic medication long-term.  I would prefer to start her on flecainide, pending stress test results.  We will schedule patient for nuclear stress test. Could consider referral to electrophysiology in the future if patient fails to maintain sinus rhythm.   In regard to hypertension, patient's blood pressure is elevated in the office today despite recheck, however she brings with her a log of home blood pressures as well as blood pressure readings from recent appointment with her PCP, all of which are well controlled.  Will not make changes to her antihypertensive medications at this time.  Again discussed with patient regarding diet and lifestyle modifications to reduce cardiovascular risks as well as risk of recurrence of atrial fibrillation/flutter.  Patient has recently started a strict diet plan and is focusing on weight loss, congratulated her and encouraged her to continue to do so.  Follow-up following stress test.   Alethia Berthold, PA-C 03/25/2020, 4:08 PM Office: [phone]

## 2020-03-25 ENCOUNTER — Ambulatory Visit: Payer: PPO | Admitting: Student

## 2020-03-25 ENCOUNTER — Encounter: Payer: Self-pay | Admitting: Student

## 2020-03-25 ENCOUNTER — Other Ambulatory Visit: Payer: Self-pay

## 2020-03-25 VITALS — BP 170/65 | HR 59 | Resp 16 | Ht 66.0 in | Wt 256.0 lb

## 2020-03-25 DIAGNOSIS — I480 Paroxysmal atrial fibrillation: Secondary | ICD-10-CM

## 2020-03-25 DIAGNOSIS — I4892 Unspecified atrial flutter: Secondary | ICD-10-CM | POA: Diagnosis not present

## 2020-03-25 DIAGNOSIS — I10 Essential (primary) hypertension: Secondary | ICD-10-CM | POA: Diagnosis not present

## 2020-03-26 DIAGNOSIS — Z1212 Encounter for screening for malignant neoplasm of rectum: Secondary | ICD-10-CM | POA: Diagnosis not present

## 2020-04-05 ENCOUNTER — Other Ambulatory Visit (HOSPITAL_COMMUNITY)
Admission: RE | Admit: 2020-04-05 | Discharge: 2020-04-05 | Disposition: A | Discharge status: Home / Self Care | Payer: PPO | Source: Ambulatory Visit | Attending: Cardiology | Admitting: Cardiology

## 2020-04-05 DIAGNOSIS — Z01812 Encounter for preprocedural laboratory examination: Secondary | ICD-10-CM | POA: Diagnosis not present

## 2020-04-05 DIAGNOSIS — Z20822 Contact with and (suspected) exposure to covid-19: Secondary | ICD-10-CM | POA: Insufficient documentation

## 2020-04-05 LAB — SARS CORONAVIRUS 2 (TAT 6-24 HRS): SARS Coronavirus 2: NEGATIVE

## 2020-04-07 ENCOUNTER — Other Ambulatory Visit: Payer: Self-pay

## 2020-04-07 ENCOUNTER — Ambulatory Visit: Payer: PPO

## 2020-04-07 DIAGNOSIS — R0602 Shortness of breath: Secondary | ICD-10-CM | POA: Diagnosis not present

## 2020-04-07 DIAGNOSIS — I4892 Unspecified atrial flutter: Secondary | ICD-10-CM | POA: Diagnosis not present

## 2020-04-09 ENCOUNTER — Telehealth: Payer: Self-pay | Admitting: Student

## 2020-04-09 DIAGNOSIS — I480 Paroxysmal atrial fibrillation: Secondary | ICD-10-CM

## 2020-04-09 MED ORDER — FLECAINIDE ACETATE 100 MG PO TABS: 100.0000 mg | ORAL_TABLET | Freq: Two times a day (BID) | ORAL | 3 refills | Status: DC

## 2020-04-09 NOTE — Progress Notes (Signed)
I have called and discussed results with patient. Please see telephone encounter 04/09/2020.

## 2020-04-09 NOTE — Telephone Encounter (Signed)
ON-CALL CARDIOLOGY 04/09/20  Patient's name: [name] [name].   MRN: [id number].    DOB: [birth year] Primary care provider: Geoffry Paradise, MD. Primary cardiologist: Dr. Jacinto Halim and Elvin So, PA-C  Interaction regarding this patient's care today: Reviewed results of nuclear stress test, which is low risk showing no ischemic changes.  Called patient and discussed low risk nuclear stress test.  As Multaq is very expensive for patient she wishes to discontinue it if possible.  As patient stress test is without evidence of coronary artery disease and renal function is normal will stop Multaq and switch her to flecainide 100 mg twice daily.  Instructed patient to finish Multaq supply that she presently has.  Then patient will for 5-day washout period of Multaq.  On 6-day after finishing Multaq supply patient will start taking flecainide.  We will follow-up in office after initiation of flecainide. Patient verbalized understanding and agreement.   Also she reports her blood pressure has been well controlled at home.   Follow-up in early January 2022 for hypertension and atrial fibrillation.  Impression:   ICD-10-CM   1. Paroxysmal atrial fibrillation (HCC)  I48.0 flecainide (TAMBOCOR) 100 MG tablet    Meds ordered this encounter  Medications  . flecainide (TAMBOCOR) 100 MG tablet    Sig: Take 1 tablet (100 mg total) by mouth 2 (two) times daily.    Dispense:  180 tablet    Refill:  3    No orders of the defined types were placed in this encounter.   Recommendations: Discontinue Multaq. Allow for 5-day washout period after finishing supply of Multaq. On 6-day after finishing Multaq supply start flecainide 100 mg twice daily. Follow-up in the office in early January 2022 for hypertension and atrial fibrillation.  Telephone encounter total time: 8 minutes.     Rayford Halsted, PA-C 04/09/2020, 11:17 AM Office: [phone]

## 2020-04-18 ENCOUNTER — Ambulatory Visit: Payer: PPO | Admitting: Student

## 2020-05-09 ENCOUNTER — Other Ambulatory Visit (HOSPITAL_COMMUNITY): Payer: Self-pay | Admitting: Registered Nurse

## 2020-05-09 DIAGNOSIS — G4733 Obstructive sleep apnea (adult) (pediatric): Secondary | ICD-10-CM | POA: Diagnosis not present

## 2020-05-09 DIAGNOSIS — U071 COVID-19: Secondary | ICD-10-CM | POA: Diagnosis not present

## 2020-05-09 DIAGNOSIS — R319 Hematuria, unspecified: Secondary | ICD-10-CM | POA: Diagnosis not present

## 2020-05-09 DIAGNOSIS — Z1152 Encounter for screening for COVID-19: Secondary | ICD-10-CM | POA: Diagnosis not present

## 2020-05-09 DIAGNOSIS — E039 Hypothyroidism, unspecified: Secondary | ICD-10-CM | POA: Diagnosis not present

## 2020-05-09 DIAGNOSIS — J029 Acute pharyngitis, unspecified: Secondary | ICD-10-CM | POA: Diagnosis not present

## 2020-05-09 DIAGNOSIS — I480 Paroxysmal atrial fibrillation: Secondary | ICD-10-CM | POA: Diagnosis not present

## 2020-05-09 DIAGNOSIS — E785 Hyperlipidemia, unspecified: Secondary | ICD-10-CM | POA: Diagnosis not present

## 2020-05-09 DIAGNOSIS — N390 Urinary tract infection, site not specified: Secondary | ICD-10-CM | POA: Diagnosis not present

## 2020-05-09 MED FILL — CIPROFLOXACIN HCL 500 MG TA: 500 | 7 days supply | Qty: 14 | Fill #0

## 2020-05-09 NOTE — Progress Notes (Deleted)
Primary Physician:  Burnard Bunting, MD  Patient ID: [name] [name], female    DOB: [birth year], 71 y.o.   MRN: [id number]  Subjective:   No chief complaint on file.  HPI: [name] [name]  is a 70 y.o. female  with paroxysmal atypical atrial flutter, hyperlipidemia, morbid obesity and hypertension. She was found to be in atrial flutter with rapid ventricular response on 03/13/2015. It was felt to be atypical atrial flutter and was evaluated by Dr. [name], recommended cardioversion, successful direct current cardioversion on 04/29/2015 with no recurrence.  02/14/2020 patient presented in atrial fibrillation with rapid ventricular response and underwent successful cardioversion 02/26/2020. She again underwent successful cardioversion from atrial fibrillation to sinus rhythm on 03/11/2020.   Patient was previously scheduled in our office for a stress test however upon presentation for the stress test she was in atrial fibrillation with RVR. At this time she was started on Multaq and scheduled for cardioversion.   Patient presents for follow up of atrial fibrillation and hypertension after cardioversion. She is presently doing well. She denies chest pain, palpitations, fatigue, dizziness, dyspnea, orthopnea, PND, syncope, near syncope. She is tolerating Xarelto without bleeding diathesis. Patient brings with her a log of home blood pressure readings 120s/70s. She is presently tolerating Multaq well, however cost is prohibitive of Multaq as a long-term antiarrythmic option.  ***Patient presents for 1 month follow-up of paroxysmal atrial fibrillation/flutter and hypertension.  Since last visit patient underwent nuclear stress testing which was low risk and therefore switched patient from Multaq to flecainide 100 mg twice daily, is now here for follow-up.  Past Medical History:  Diagnosis Date  . Anxiety and depression   . Aortic arch atherosclerosis ([place]) 07/13/2018  . Exogenous obesity   .  Fibromyalgia   . Hyperlipidemia   . Hypothyroidism   . Morbid obesity ([place])   . OSA (obstructive sleep apnea)    uses CPAP  . Palpitations 07/13/2018  . Paroxysmal atrial flutter ([place])   . Periodic limb movement disorder   . REM sleep behavior disorder   . Rhinitis   . SOB (shortness of breath)   . Spinal stenosis   . Unspecified hypothyroidism     Past Surgical History:  Procedure Laterality Date  . CARDIOVERSION N/A 04/29/2015   Procedure: CARDIOVERSION;  Surgeon: Adrian Prows, MD;  Location: [facility] ENDOSCOPY;  Service: Cardiovascular;  Laterality: N/A;  . CARDIOVERSION N/A 02/26/2020   Procedure: CARDIOVERSION;  Surgeon: Adrian Prows, MD;  Location: [facility] ENDOSCOPY;  Service: Cardiovascular;  Laterality: N/A;  . CARDIOVERSION N/A 03/11/2020   Procedure: CARDIOVERSION;  Surgeon: Adrian Prows, MD;  Location: [place];  Service: Cardiovascular;  Laterality: N/A;  . CARPAL TUNNEL RELEASE     Bilateral  . DILATION AND CURETTAGE OF UTERUS    . left rotator cuff    . resection uterine polyp  2011   Social History   Tobacco Use  . Smoking status: Never Smoker  . Smokeless tobacco: Never Used  Substance Use Topics  . Alcohol use: No    Review of Systems  Constitutional: Negative for malaise/fatigue and weight gain.  Cardiovascular: Negative for chest pain, claudication, dyspnea on exertion, leg swelling, near-syncope, orthopnea, palpitations, paroxysmal nocturnal dyspnea and syncope.  Respiratory: Negative for shortness of breath.   Hematologic/Lymphatic: Does not bruise/bleed easily.  Musculoskeletal: Positive for arthritis (left hip) and back pain.  Gastrointestinal: Negative for melena.  Neurological: Negative for dizziness and weakness.   Objective:  There were no vitals taken for  this visit. There is no height or weight on file to calculate BMI.   Vitals with BMI 03/25/2020 03/25/2020 03/11/2020  Height - $Remove'5\' 6"'PokRBov$  -  Weight - 256 lbs -  BMI - 71.24 -  Systolic 580 998 338   Diastolic 65 71 56  Pulse - 59 67    No data found.  Physical Exam Constitutional:      Comments: Morbidly obese  Neck:     Comments: Short neck and difficult to evaluate JVP Cardiovascular:     Rate and Rhythm: Regular rhythm. Bradycardia present.     Pulses: Intact distal pulses.     Heart sounds: Normal heart sounds. No murmur heard. No gallop.      Comments: Femoral and popliteal pulse difficult to feel due to patient's body habitus.  No JVD. No leg edema Pulmonary:     Effort: Pulmonary effort is normal.     Breath sounds: Normal breath sounds.  Abdominal:     General: Bowel sounds are normal.     Palpations: Abdomen is soft.     Comments: Obese    Laboratory examination:   CMP Latest Ref Rng & Units 03/11/2020 03/11/2020 02/14/2020  Glucose 70 - 99 mg/dL 101(H) 111(H) 112(H)  BUN 8 - 23 mg/dL 17 24(H) 23  Creatinine 0.44 - 1.00 mg/dL 1.10(H) 1.10(H) 0.90  Sodium 135 - 145 mmol/L 143 137 140  Potassium 3.5 - 5.1 mmol/L 4.1 6.5(HH) 4.7  Chloride 98 - 111 mmol/L 106 106 101  CO2 20 - 29 mmol/L - - 24  Calcium 8.7 - 10.3 mg/dL - - 9.6  Total Protein 6.0 - 8.5 g/dL - - -  Total Bilirubin 0.0 - 1.2 mg/dL - - -  Alkaline Phos 39 - 117 IU/L - - -  AST 0 - 40 IU/L - - -  ALT 0 - 32 IU/L - - -   CBC Latest Ref Rng & Units 03/11/2020 03/11/2020 02/14/2020  WBC 3.4 - 10.8 x10E3/uL - - 10.2  Hemoglobin 12.0 - 15.0 g/dL 13.9 13.3 13.4  Hematocrit 36.0 - 46.0 % 41.0 39.0 41.2  Platelets 150 - 450 x10E3/uL - - 196   Lipid Panel     Component Value Date/Time   CHOL 129 11/14/2018 0830   TRIG 83 11/14/2018 0830   HDL 49 11/14/2018 0830   LDLCALC 63 11/14/2018 0830   HEMOGLOBIN A1C No results found for: HGBA1C, MPG TSH No results for input(s): TSH in the last 8760 hours.  External labs:  03/17/2020:  BUN 13, creatinine 1.0, EGFR 54.8, sodium 139, potassium 4.6 Hemoglobin 12.9, hematocrit 39.7, platelets 185 Total cholesterol 108, triglycerides 57, HDL 44, LDL  53  02/27/2019: HDL 46, LDL 63, triglycerides 76, total cholesterol 124 BUN 14, creatinine 0.90, TSH 0.84  11/21/2017: Cholesterol 133, triglycerides 88, HDL 47, LDL 88.  Creatinine 0.94, EGFR 63/72, potassium 4.7, CMP normal.  CBC normal.   Current Meds  Medication Sig  . Apple Cider Vinegar 188 MG CAPS Take by mouth.  . Ascorbic Acid (VITAMIN C) 100 MG tablet Take 100 mg by mouth daily.  Marland Kitchen levothyroxine (SYNTHROID) 175 MCG tablet Take 175 mcg by mouth daily.  . metoprolol tartrate (LOPRESSOR) 25 MG tablet TAKE 1 TABLET BY MOUTH THREE TIMES A DAY  . nitrofurantoin (MACRODANTIN) 100 MG capsule Take 100 mg by mouth 4 (four) times daily.  Marland Kitchen olmesartan (BENICAR) 5 MG tablet Take 2 tablets (10 mg total) by mouth daily.  . rosuvastatin (CRESTOR) 10 MG tablet  TAKE ONE HALF TABLET BY MOUTH ONCE A DAY.  Marland Kitchen VITAMIN D PO Take 5,000 Units by mouth daily.   Carlena Hurl 20 MG TABS tablet TAKE 1 TABLET BY MOUTH EVERY DAY IN THE EVENING    Radiology:   No results found.  Cardiac Studies:   PCV MYOCARDIAL PERFUSION WO LEXISCAN 04/07/2020 Lexiscan/modified Bruce nuclear stress test performed using 1-day protocol. Patient reached 5 mets and 80% MPHR. Stress EKG at 80% MPHR showed sinus tachcyardia, no ischemic changes. Normal myocardial perfusion. Stress LVEF 64%. Low risk study.  Direct-current cardioversion 03/11/2020: Converted to normal sinus rhythm with 120 J of electricity x1.  Direct-current cardioversion 02/26/2020:  A flutter to A. fib to normal sinus rhythm with 75x 1 then 150 Joules of electricity X 1  PCV ECHOCARDIOGRAM COMPLETE 02/19/2020 Normal LV systolic function with EF 58%. Left ventricle cavity is normal in size. Normal global wall motion. Indeterminate diastolic filling pattern. Calculated EF 58%. Left atrial cavity is mildly dilated. Structurally normal mitral valve.  Mild (Grade I) mitral regurgitation. Structurally normal tricuspid valve with trace regurgitation. No  evidence of pulmonary hypertension. Patient appeared to be in atrial flutter during examination. Compared to 03/19/2015, rhythm abnormal now.  Sleep Study 2006: Used CPAP. Repeat sleep study. 05/28/2015 by Dr. Graciela Husbands: No e/o sleep apnea.  Chest x-ray 04/30/2015: Mild CHF, atherosclerotic calcification the aortic arch.  Direct current cardioversion 04/29/2015: A. Flutter to NSR with 75J x 1.  Treadmill Exercise stress 10/11/2014: Indication: Palpitations, Chest pain The patient exercised according to Bruce Protocol, Total time recorded 04:83min achieving max heart rate of 141 which was 90% of MPHR for age and 5.80 METS of work. Normal BP response. Resting ECG NSR, low voltage complexes. There was no ST-T changes of ischemia with exercise stress test. Stress terminated due to THR (>85% MPHR)/MPHR met and fatigue. Decreased exercise tolerence.   EKG:   ***  EKG 03/25/2020: Sinus bradycardia at a rate of 59 bpm with a single PAC.  Normal axis.  Poor R wave progression, cannot exclude anteroseptal infarct old.  Incomplete right bundle branch block.  Compared to EKG 03/03/2020, now sinus rhythm.  EKG 03/03/2020: Atrial fibrillation with controlled ventricular response at a rate of 94 bpm.  Normal axis.  Incomplete right bundle branch block.  Poor R wave progression cannot exclude anteroseptal infarct old.  Compared to EKG 02/14/2020, ventricular rate has improved.  EKG 07/27/2019: Normal sinus rhythm with rate of 64 bpm, left abnormality, normal axis.  No evidence of ischemia, normal EKG.   No significant change from EKG 01/23/2019.   Assessment:     ICD-10-CM   1. Paroxysmal atrial fibrillation (HCC)  I48.0   2. Essential hypertension  I10    No orders of the defined types were placed in this encounter.  There are no discontinued medications. This patients CHA2DS2-VASc Score 3 (HTN, Age, F) and yearly risk of stroke 3.2%.   Recommendations:    [name] [name]  is a 71 y.o.  with  paroxysmal atypical atrial flutter, hyperlipidemia, morbid obesity and hypertension. She was found to be in atrial flutter with rapid ventricular response on 03/13/2015. It was felt to be atypical atrial flutter and was evaluated by Dr. Sherryl Manges, recommended cardioversion, successful direct current cardioversion on 04/29/2015 with no recurrence.  02/14/2020 patient presented in atrial fibrillation with rapid ventricular response and underwent successful cardioversion 02/26/2020.  She again underwent successful cardioversion from atrial fibrillation to sinus rhythm on 03/11/2020.   Patient presents for follow-up  of paroxysmal atrial fibrillation/atrial flutter and hypertension following cardioversion on 03/11/2020.  She is presently maintaining sinus rhythm.  She reports her symptoms of dizziness, palpitations, dizziness have all resolved since starting Multaq and having the cardioversion done.  She continues to tolerate Xarelto without bleeding diathesis.  We will continue Multaq, metoprolol tartrate, Xarelto at this time.  However patient reports that Multaq is cost prohibitive for her as a long-term option.  Discussed with patient that it is necessary to get stress testing done in order to evaluate her for underlying coronary artery disease to further investigate etiology of atrial fibrillation/flutter as well as determine appropriate antiarrhythmic medication long-term.  I would prefer to start her on flecainide, pending stress test results.  We will schedule patient for nuclear stress test. Could consider referral to electrophysiology in the future if patient fails to maintain sinus rhythm.   In regard to hypertension, patient's blood pressure is elevated in the office today despite recheck, however she brings with her a log of home blood pressures as well as blood pressure readings from recent appointment with her PCP, all of which are well controlled.  Will not make changes to her antihypertensive  medications at this time.  Again discussed with patient regarding diet and lifestyle modifications to reduce cardiovascular risks as well as risk of recurrence of atrial fibrillation/flutter.  Patient has recently started a strict diet plan and is focusing on weight loss, congratulated her and encouraged her to continue to do so.  ***BP?

## 2020-05-12 ENCOUNTER — Ambulatory Visit: Payer: PPO | Admitting: Student

## 2020-05-12 DIAGNOSIS — I10 Essential (primary) hypertension: Secondary | ICD-10-CM

## 2020-05-12 DIAGNOSIS — I480 Paroxysmal atrial fibrillation: Secondary | ICD-10-CM

## 2020-05-19 NOTE — Progress Notes (Signed)
Primary Physician:  Burnard Bunting, MD  Patient ID: [name] [name], female    DOB: 1950-09-03, 70 y.o.   MRN: [id number]  Subjective:   Chief Complaint  Patient presents with   Paroxysmal atrial fibrillation    Follow-up    Exercise myoview   HPI: [name] [name]  is a 70 y.o. female  with paroxysmal atypical atrial flutter, hyperlipidemia, morbid obesity and hypertension. She was found to be in atrial flutter with rapid ventricular response on 03/13/2015. It was felt to be atypical atrial flutter and was evaluated by Dr. [name], recommended cardioversion, successful direct current cardioversion on 04/29/2015 with no recurrence.  02/14/2020 patient presented in atrial fibrillation with rapid ventricular response and underwent successful cardioversion 02/26/2020. She again underwent successful cardioversion from atrial fibrillation to sinus rhythm on 03/11/2020.   Patient presents for 1 month follow-up of paroxysmal atrial fibrillation/flutter and hypertension.  Since last visit patient underwent nuclear stress testing which was low risk and therefore switched patient from Multaq to flecainide 100 mg twice daily, is now here for follow-up.  Patient reports she recently had COVID-19 infection 05/06/2020, symptoms have significantly improved.  She is presently feeling well.  Denies chest pain, palpitations, dizziness, fatigue, leg swelling, syncope, near syncope.  Patient denies orthopnea, dyspnea, PND.  She is tolerating Xarelto without bleeding diathesis.  Patient has no longer been monitoring her blood pressure at home.  She does report recent life stressors, which may be contributing to elevated blood pressure.   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/2018   Exogenous obesity    Fibromyalgia    Hyperlipidemia    Hypothyroidism    Morbid obesity (HCC)    OSA (obstructive sleep apnea)    uses CPAP    Palpitations 07/13/2018   Paroxysmal atrial flutter (HCC)    Periodic limb movement disorder    REM sleep behavior disorder    Rhinitis    SOB (shortness of breath)    Spinal stenosis    Unspecified hypothyroidism     Past Surgical History:  Procedure Laterality Date   CARDIOVERSION N/A 04/29/2015   Procedure: CARDIOVERSION;  Surgeon: Adrian Prows, MD;  Location: [facility];  Service: Cardiovascular;  Laterality: N/A;   CARDIOVERSION N/A 02/26/2020   Procedure: CARDIOVERSION;  Surgeon: Adrian Prows, MD;  Location: [facility];  Service: Cardiovascular;  Laterality: N/A;   CARDIOVERSION N/A 03/11/2020   Procedure: CARDIOVERSION;  Surgeon: Adrian Prows, MD;  Location: [facility];  Service: Cardiovascular;  Laterality: N/A;   CARPAL TUNNEL RELEASE     Bilateral   DILATION AND CURETTAGE OF UTERUS     left rotator cuff     resection uterine polyp  2011   Social History   Tobacco Use   Smoking status: Never Smoker   Smokeless tobacco: Never Used  Substance Use Topics   Alcohol use: No    Review of Systems  Constitutional: Negative for malaise/fatigue and weight gain.  Cardiovascular: Negative for chest pain, claudication, dyspnea on exertion, leg swelling, near-syncope, orthopnea, palpitations, paroxysmal nocturnal dyspnea and syncope.  Respiratory: Negative for shortness of breath.   Hematologic/Lymphatic: Does not bruise/bleed easily.  Musculoskeletal: Positive for arthritis (left hip) and back pain.  Gastrointestinal: Negative for melena.  Neurological: Negative for dizziness and weakness.   Objective:  Blood pressure (!) 179/69, pulse 63, temperature 98.6 F (37 C), temperature source Temporal, resp. rate 16, height $RemoveBe'5\' 6"'pZeQsXlfP$  (1.676 m), weight  252 lb 11.2 oz (114.6 kg), SpO2 99 %. Body mass index is 40.79 kg/m.   Vitals with BMI 05/21/2020 03/25/2020 03/25/2020  Height $Remov'5\' 6"'DvDihp$  - $'5\' 6"'R$   Weight 252 lbs 11 oz - 256 lbs  BMI 30.09 - 23.30  Systolic 076 226 333   Diastolic 69 65 71  Pulse 63 - 59    Orthostatic VS for the past 72 hrs (Last 3 readings):  Patient Position BP Location Cuff Size  05/21/20 1251 Sitting Left Arm Large    Physical Exam Vitals reviewed.  Constitutional:      Comments: Morbidly obese  HENT:     Head: Normocephalic and atraumatic.  Neck:     Comments: Short neck and difficult to evaluate JVP Cardiovascular:     Rate and Rhythm: Normal rate and regular rhythm.     Pulses: Intact distal pulses.     Heart sounds: Normal heart sounds, S1 normal and S2 normal. No murmur heard. No gallop.      Comments: No JVD. No leg edema Pulmonary:     Effort: Pulmonary effort is normal. No respiratory distress.     Breath sounds: Normal breath sounds. No wheezing, rhonchi or rales.  Abdominal:     General: Bowel sounds are normal.     Palpations: Abdomen is soft.     Comments: Obese  Musculoskeletal:     Right lower leg: No edema.     Left lower leg: No edema.  Neurological:     Mental Status: She is alert.    Laboratory examination:   CMP Latest Ref Rng & Units 03/11/2020 03/11/2020 02/14/2020  Glucose 70 - 99 mg/dL 101(H) 111(H) 112(H)  BUN 8 - 23 mg/dL 17 24(H) 23  Creatinine 0.44 - 1.00 mg/dL 1.10(H) 1.10(H) 0.90  Sodium 135 - 145 mmol/L 143 137 140  Potassium 3.5 - 5.1 mmol/L 4.1 6.5(HH) 4.7  Chloride 98 - 111 mmol/L 106 106 101  CO2 20 - 29 mmol/L - - 24  Calcium 8.7 - 10.3 mg/dL - - 9.6  Total Protein 6.0 - 8.5 g/dL - - -  Total Bilirubin 0.0 - 1.2 mg/dL - - -  Alkaline Phos 39 - 117 IU/L - - -  AST 0 - 40 IU/L - - -  ALT 0 - 32 IU/L - - -   CBC Latest Ref Rng & Units 03/11/2020 03/11/2020 02/14/2020  WBC 3.4 - 10.8 x10E3/uL - - 10.2  Hemoglobin 12.0 - 15.0 g/dL 13.9 13.3 13.4  Hematocrit 36.0 - 46.0 % 41.0 39.0 41.2  Platelets 150 - 450 x10E3/uL - - 196   Lipid Panel     Component Value Date/Time   CHOL 129 11/14/2018 0830   TRIG 83 11/14/2018 0830   HDL 49 11/14/2018 0830   LDLCALC 63 11/14/2018  0830   HEMOGLOBIN A1C No results found for: HGBA1C, MPG TSH No results for input(s): TSH in the last 8760 hours.  External labs:  03/17/2020:  BUN 13, creatinine 1.0, EGFR 54.8, sodium 139, potassium 4.6 Hemoglobin 12.9, hematocrit 39.7, platelets 185 Total cholesterol 108, triglycerides 57, HDL 44, LDL 53  02/27/2019: HDL 46, LDL 63, triglycerides 76, total cholesterol 124 BUN 14, creatinine 0.90, TSH 0.84  11/21/2017: Cholesterol 133, triglycerides 88, HDL 47, LDL 88.  Creatinine 0.94, EGFR 63/72, potassium 4.7, CMP normal.  CBC normal.   Current Meds  Medication Sig   Apple Cider Vinegar 188 MG CAPS Take by mouth.   Ascorbic Acid (VITAMIN C) 100 MG tablet Take  100 mg by mouth daily.   levothyroxine (SYNTHROID) 175 MCG tablet Take 175 mcg by mouth daily.   metoprolol tartrate (LOPRESSOR) 25 MG tablet TAKE 1 TABLET BY MOUTH THREE TIMES A DAY   nitrofurantoin (MACRODANTIN) 100 MG capsule Take 100 mg by mouth 4 (four) times daily.   olmesartan (BENICAR) 5 MG tablet Take 2 tablets (10 mg total) by mouth daily.   rosuvastatin (CRESTOR) 10 MG tablet TAKE ONE HALF TABLET BY MOUTH ONCE A DAY.   VITAMIN D PO Take 5,000 Units by mouth daily.    XARELTO 20 MG TABS tablet TAKE 1 TABLET BY MOUTH EVERY DAY IN THE EVENING    Radiology:   No results found.  Cardiac Studies:   PCV MYOCARDIAL PERFUSION WO LEXISCAN 04/07/2020 Lexiscan/modified [facility] nuclear stress test performed using 1-day protocol. Patient reached 5 mets and 80% MPHR. Stress EKG at 80% MPHR showed sinus tachcyardia, no ischemic changes. Normal myocardial perfusion. Stress LVEF 64%. Low risk study.  Direct-current cardioversion 03/11/2020: Converted to normal sinus rhythm with 120 J of electricity x1.  Direct-current cardioversion 02/26/2020:  A flutter to A. fib to normal sinus rhythm with 75x 1 then 150 Joules of electricity X 1  PCV ECHOCARDIOGRAM COMPLETE 68/34/1962 Normal LV systolic function with EF  58%. Left ventricle cavity is normal in size. Normal global wall motion. Indeterminate diastolic filling pattern. Calculated EF 58%. Left atrial cavity is mildly dilated. Structurally normal mitral valve.  Mild (Grade I) mitral regurgitation. Structurally normal tricuspid valve with trace regurgitation. No evidence of pulmonary hypertension. Patient appeared to be in atrial flutter during examination. Compared to 03/19/2015, rhythm abnormal now.  Sleep Study 2006: Used CPAP. Repeat sleep study. 05/28/2015 by Dr. Caryl Comes: No e/o sleep apnea.  Chest x-ray 04/30/2015: Mild CHF, atherosclerotic calcification the aortic arch.  Direct current cardioversion 04/29/2015: A. Flutter to NSR with 75J x 1.  Treadmill Exercise stress 10/11/2014: Indication: Palpitations, Chest pain The patient exercised according to [facility] Protocol, Total time recorded 04:30min achieving max heart rate of 141 which was 90% of MPHR for age and 5.80 METS of work. Normal BP response. Resting ECG NSR, low voltage complexes. There was no ST-T changes of ischemia with exercise stress test. Stress terminated due to THR (>85% MPHR)/MPHR met and fatigue. Decreased exercise tolerence.   EKG:   EKG 05/21/2020: Sinus rhythm at a rate of 61 bpm with first-degree AV block.  Normal axis.  Incomplete right bundle branch block.  Poor R wave progression, cannot exclude anteroseptal infarct old.  Compared to EKG 03/25/2020, no significant change.  EKG 03/03/2020: Atrial fibrillation with controlled ventricular response at a rate of 94 bpm.  Normal axis.  Incomplete right bundle branch block.  Poor R wave progression cannot exclude anteroseptal infarct old.  Compared to EKG 02/14/2020, ventricular rate has improved.  EKG 07/27/2019: Normal sinus rhythm with rate of 64 bpm, left abnormality, normal axis.  No evidence of ischemia, normal EKG.   No significant change from EKG 01/23/2019.   Assessment:     ICD-10-CM   1. Paroxysmal atrial  flutter (HCC)  I48.92 EKG 12-Lead  2. Essential hypertension  I10 hydrochlorothiazide (MICROZIDE) 12.5 MG capsule    Basic metabolic panel   Meds ordered this encounter  Medications   rivaroxaban (XARELTO) 20 MG TABS tablet    Sig: Take 1 tablet (20 mg total) by mouth every evening.    Dispense:  90 tablet    Refill:  3   hydrochlorothiazide (MICROZIDE) 12.5 MG capsule  Sig: Take 1 capsule (12.5 mg total) by mouth daily.    Dispense:  30 capsule    Refill:  3   Medications Discontinued During This Encounter  Medication Reason   XARELTO 20 MG TABS tablet Reorder   This patients CHA2DS2-VASc Score 3 (HTN, Age, F) and yearly risk of stroke 3.2%.   Recommendations:    BETHENY SUCHECKI  is a 70 y.o.  with paroxysmal atypical atrial flutter, hyperlipidemia, morbid obesity and hypertension. She was found to be in atrial flutter with rapid ventricular response on 03/13/2015. It was felt to be atypical atrial flutter and was evaluated by Dr. [name], recommended cardioversion, successful direct current cardioversion on 04/29/2015 with no recurrence.  02/14/2020 patient presented in atrial fibrillation with rapid ventricular response and underwent successful cardioversion 02/26/2020.  She again underwent successful cardioversion from atrial fibrillation to sinus rhythm on 03/11/2020.   Patient presents for 1 month follow-up of atrial fibrillation and hypertension.  She is presently maintaining sinus rhythm, now on flecainide as her nuclear stress test was low risk.  Reviewed and discussed results of nuclear stress test with patient, she verbalized understanding that the stress test was not concerning for significant coronary artery disease.  She continues to tolerate Xarelto without bleeding diathesis without bleeding diathesis, will continue this as patient's CHA2DS2-VASc wears 3. Could consider referral to electrophysiology in the future if patient fails to maintain sinus rhythm.   In  regard to hypertension, patient's blood pressure had been previously well controlled by home readings.  In the office today her blood pressure is significantly elevated and she has not been monitoring it at home.  Therefore, Will start patient on hydrochlorothiazide 12.5 mg once daily and repeat BMP in 1 week.  Patient will monitor blood pressure daily at home and bring a written log with her to her next visit.  Counseled patient regarding side effects, she verbalized understanding and will notify our office if she experiences adverse reaction.  Of note patient has previously been on olmesartan 25 mg daily, which caused fatigue and dizziness.  Follow up in 6 weeks for hypertension.    Alethia Berthold, PA-C 05/21/2020, 2:00 PM Office: [phone]

## 2020-05-21 ENCOUNTER — Encounter: Payer: Self-pay | Admitting: Student

## 2020-05-21 ENCOUNTER — Other Ambulatory Visit: Payer: Self-pay

## 2020-05-21 ENCOUNTER — Ambulatory Visit: Payer: PPO | Admitting: Student

## 2020-05-21 VITALS — BP 179/69 | HR 63 | Temp 98.6°F | Resp 16 | Ht 66.0 in | Wt 252.7 lb

## 2020-05-21 DIAGNOSIS — I10 Essential (primary) hypertension: Secondary | ICD-10-CM | POA: Diagnosis not present

## 2020-05-21 DIAGNOSIS — I4892 Unspecified atrial flutter: Secondary | ICD-10-CM

## 2020-05-21 MED ORDER — RIVAROXABAN 20 MG PO TABS
20.0000 mg | ORAL_TABLET | Freq: Every evening | ORAL | 3 refills | Status: DC
Start: 2020-05-21 — End: 2021-03-04

## 2020-05-21 MED ORDER — HYDROCHLOROTHIAZIDE 12.5 MG PO CAPS: 12.5000 mg | ORAL_CAPSULE | Freq: Every day | ORAL | 3 refills | Status: DC

## 2020-05-28 DIAGNOSIS — I10 Essential (primary) hypertension: Secondary | ICD-10-CM | POA: Diagnosis not present

## 2020-05-29 ENCOUNTER — Other Ambulatory Visit: Payer: Self-pay | Admitting: Student

## 2020-05-29 LAB — BASIC METABOLIC PANEL
BUN/Creatinine Ratio: 17 (ref 12–28)
BUN: 20 mg/dL (ref 8–27)
CO2: 26 mmol/L (ref 20–29)
Calcium: 9 mg/dL (ref 8.7–10.3)
Chloride: 100 mmol/L (ref 96–106)
Creatinine, Ser: 1.16 mg/dL — ABNORMAL HIGH (ref 0.57–1.00)
GFR calc Af Amer: 55 mL/min/{1.73_m2} — ABNORMAL LOW (ref >59)
GFR calc non Af Amer: 48 mL/min/{1.73_m2} — ABNORMAL LOW (ref >59)
Glucose: 86 mg/dL (ref 65–99)
Potassium: 4.3 mmol/L (ref 3.5–5.2)
Sodium: 139 mmol/L (ref 134–144)

## 2020-05-29 MED ORDER — AMLODIPINE BESYLATE 5 MG PO TABS: 5.0000 mg | ORAL_TABLET | Freq: Every day | ORAL | 3 refills | Status: DC

## 2020-05-29 NOTE — Progress Notes (Signed)
Stopped HCTZ due to renal dysfunction, start amlodipine for hypertension.

## 2020-05-29 NOTE — Progress Notes (Signed)
Renal function has decreased with addition of HCTZ. Spoke with patient, she will stop HCTZ and start amlodipine 5 mg daily. Home BP readings continue to be elevated above goal. Patient will continue to monitor BP at home and bring written log with her to next office visit.

## 2020-06-16 ENCOUNTER — Telehealth: Payer: Self-pay

## 2020-06-16 ENCOUNTER — Other Ambulatory Visit: Payer: Self-pay | Admitting: Student

## 2020-06-16 MED ORDER — AMLODIPINE BESYLATE 5 MG PO TABS: 2.5000 mg | ORAL_TABLET | Freq: Every day | ORAL | 3 refills | Status: DC

## 2020-06-16 NOTE — Telephone Encounter (Signed)
Patient called that she is having vertigo frequency patient states she has been feeling like this for a couple weeks patient is wondering if the vertigo has something to do with the medications she is on patient wanted to speak to you please advise

## 2020-06-16 NOTE — Telephone Encounter (Signed)
I called patient she has noticed over the last few weeks that the room feels like it is spinning. These symptoms seem to be triggered by rolling over in bed of fast movements looking up, left or right. Patient feels these symptoms started when she was started on amlodipine 5 mg. Advised her to reduce amlodipine from 5mg  to 2.5 mg daily. Will schedule for office visit.

## 2020-07-01 NOTE — Progress Notes (Signed)
Primary Physician:  Burnard Bunting, MD  Patient ID: [name] [name], female    DOB: [birth year], 70 y.o.   MRN: [id number]  Subjective:   Chief Complaint  Patient presents with  . Atrial Flutter  . Hypertension  . Follow-up    6 week   HPI: [name] [name]  is a 70 y.o. female  with paroxysmal atypical atrial flutter, hyperlipidemia, morbid obesity and hypertension. She was found to be in atrial flutter with rapid ventricular response on 03/13/2015. It was felt to be atypical atrial flutter and was evaluated by Dr. [name], recommended cardioversion, successful direct current cardioversion on 04/29/2015 with no recurrence.  02/14/2020 patient presented in atrial fibrillation with rapid ventricular response and underwent successful cardioversion 02/26/2020. She again underwent successful cardioversion from atrial fibrillation to sinus rhythm on 03/11/2020.   Patient presents for follow up of hypertension. Patient had previously been on amlodipine 5 mg daily, but experienced dizziness, therefore reduced to amlodipine 2.5 mg daily. She now presents for follow up. Notably she was unable to tolerate HCTZ due to worsening renal function.  Patient brings with her written log of home blood pressure readings, which are well controlled.  Patient has had no recurrence of dizziness since reducing amlodipine to 2.5 mg daily.  However upon discussion of her symptoms, suggestive of vertigo.  Patient continues to walk on a daily basis without issue, in fact feels her energy level has improved since she has increased her physical activity.  Denies chest pain, palpitations, dyspnea, syncope, near syncope, leg swelling.  Denies orthopnea, PND.  She is presently tolerating Xarelto without bleeding diathesis.  She has had no known recurrence of atrial fibrillation since cardioversion 03/11/2020.  Past Medical History:  Diagnosis Date  . Anxiety and depression   . Aortic arch atherosclerosis ([facility] [facility] [place]) 07/13/2018  .  Exogenous obesity   . Fibromyalgia   . Hyperlipidemia   . Hypothyroidism   . Morbid obesity ([place])   . OSA (obstructive sleep apnea)    uses CPAP  . Palpitations 07/13/2018  . Paroxysmal atrial flutter ([place])   . Periodic limb movement disorder   . REM sleep behavior disorder   . Rhinitis   . SOB (shortness of breath)   . Spinal stenosis   . Unspecified hypothyroidism     Past Surgical History:  Procedure Laterality Date  . CARDIOVERSION N/A 04/29/2015   Procedure: CARDIOVERSION;  Surgeon: Adrian Prows, MD;  Location: [facility] ENDOSCOPY;  Service: Cardiovascular;  Laterality: N/A;  . CARDIOVERSION N/A 02/26/2020   Procedure: CARDIOVERSION;  Surgeon: Adrian Prows, MD;  Location: [facility] ENDOSCOPY;  Service: Cardiovascular;  Laterality: N/A;  . CARDIOVERSION N/A 03/11/2020   Procedure: CARDIOVERSION;  Surgeon: Adrian Prows, MD;  Location: [facility];  Service: Cardiovascular;  Laterality: N/A;  . CARPAL TUNNEL RELEASE     Bilateral  . DILATION AND CURETTAGE OF UTERUS    . left rotator cuff    . resection uterine polyp  2011   Social History   Tobacco Use  . Smoking status: Never Smoker  . Smokeless tobacco: Never Used  Substance Use Topics  . Alcohol use: No    Review of Systems  Constitutional: Negative for malaise/fatigue and weight gain.  Cardiovascular: Negative for chest pain, claudication, dyspnea on exertion, leg swelling, near-syncope, orthopnea, palpitations, paroxysmal nocturnal dyspnea and syncope.  Respiratory: Negative for shortness of breath.   Hematologic/Lymphatic: Does not bruise/bleed easily.  Musculoskeletal: Positive for arthritis (left hip) and back pain.  Gastrointestinal: Negative for melena.  Neurological: Negative for dizziness and weakness.   Objective:  Blood pressure (!) 137/57, pulse 60, temperature (!) 97.1 F (36.2 C), resp. rate 16, height $RemoveBe'5\' 6"'sBnUmiZfl$  (1.676 m), weight 256 lb 3.2 oz (116.2 kg), SpO2 99 %. Body mass index is 41.35 kg/m.   Vitals with BMI  07/02/2020 05/21/2020 03/25/2020  Height $Remov'5\' 6"'svhxFL$  $Remove'5\' 6"'ppZThTU$  -  Weight 256 lbs 3 oz 252 lbs 11 oz -  BMI 44.03 47.42 -  Systolic 595 638 756  Diastolic 57 69 65  Pulse 60 63 -    Orthostatic VS for the past 72 hrs (Last 3 readings):  Patient Position BP Location Cuff Size  07/02/20 1105 Sitting Left Arm Large    Physical Exam Vitals reviewed.  Constitutional:      Appearance: She is obese.  HENT:     Head: Normocephalic and atraumatic.  Neck:     Comments: Short neck and difficult to evaluate JVP Cardiovascular:     Rate and Rhythm: Normal rate and regular rhythm.     Pulses: Intact distal pulses.     Heart sounds: Normal heart sounds, S1 normal and S2 normal. No murmur heard. No gallop.      Comments: No JVD. No leg edema Pulmonary:     Effort: Pulmonary effort is normal. No respiratory distress.     Breath sounds: Normal breath sounds. No wheezing, rhonchi or rales.  Abdominal:     General: Bowel sounds are normal.     Palpations: Abdomen is soft.     Comments: Obese  Musculoskeletal:     Right lower leg: No edema.     Left lower leg: No edema.  Skin:    General: Skin is warm and dry.  Neurological:     Mental Status: She is alert.    Laboratory examination:   CMP Latest Ref Rng & Units 05/28/2020 03/11/2020 03/11/2020  Glucose 65 - 99 mg/dL 86 101(H) 111(H)  BUN 8 - 27 mg/dL 20 17 24(H)  Creatinine 0.57 - 1.00 mg/dL 1.16(H) 1.10(H) 1.10(H)  Sodium 134 - 144 mmol/L 139 143 137  Potassium 3.5 - 5.2 mmol/L 4.3 4.1 6.5(HH)  Chloride 96 - 106 mmol/L 100 106 106  CO2 20 - 29 mmol/L 26 - -  Calcium 8.7 - 10.3 mg/dL 9.0 - -  Total Protein 6.0 - 8.5 g/dL - - -  Total Bilirubin 0.0 - 1.2 mg/dL - - -  Alkaline Phos 39 - 117 IU/L - - -  AST 0 - 40 IU/L - - -  ALT 0 - 32 IU/L - - -   CBC Latest Ref Rng & Units 03/11/2020 03/11/2020 02/14/2020  WBC 3.4 - 10.8 x10E3/uL - - 10.2  Hemoglobin 12.0 - 15.0 g/dL 13.9 13.3 13.4  Hematocrit 36.0 - 46.0 % 41.0 39.0 41.2  Platelets 150 -  450 x10E3/uL - - 196   Lipid Panel     Component Value Date/Time   CHOL 129 11/14/2018 0830   TRIG 83 11/14/2018 0830   HDL 49 11/14/2018 0830   LDLCALC 63 11/14/2018 0830   HEMOGLOBIN A1C No results found for: HGBA1C, MPG TSH No results for input(s): TSH in the last 8760 hours.  External labs:  03/17/2020:  BUN 13, creatinine 1.0, EGFR 54.8, sodium 139, potassium 4.6 Hemoglobin 12.9, hematocrit 39.7, platelets 185 Total cholesterol 108, triglycerides 57, HDL 44, LDL 53  02/27/2019: HDL 46, LDL 63, triglycerides 76, total cholesterol 124 BUN 14, creatinine 0.90, TSH 0.84  11/21/2017: Cholesterol 133, triglycerides  88, HDL 47, LDL 88.  Creatinine 0.94, EGFR 63/72, potassium 4.7, CMP normal.  CBC normal.   Allergy and Medications    Allergies  Allergen Reactions  . Azithromycin Other (See Comments)    unknown  . Codeine Nausea And Vomiting  . Morphine Nausea And Vomiting   Current Outpatient Medications  Medication Instructions  . acetaminophen (TYLENOL) 500-1,000 mg, Oral, Every 6 hours PRN  . amLODipine (NORVASC) 2.5 mg, Oral, Daily  . flecainide (TAMBOCOR) 100 mg, Oral, 2 times daily  . levothyroxine (SYNTHROID) 175 mcg, Oral, Daily  . metoprolol tartrate (LOPRESSOR) 25 mg, Oral, 3 times daily  . rivaroxaban (XARELTO) 20 mg, Oral, Every evening  . rosuvastatin (CRESTOR) 10 MG tablet TAKE ONE HALF TABLET BY MOUTH ONCE A DAY.  Marland Kitchen Vitamin C 500 mg, Oral, Every evening  . Vitamin D-3 5,000 mg, Oral, Daily    Radiology:   No results found.  Cardiac Studies:   PCV MYOCARDIAL PERFUSION WO LEXISCAN 04/07/2020 Lexiscan/modified Bruce nuclear stress test performed using 1-day protocol. Patient reached 5 mets and 80% MPHR. Stress EKG at 80% MPHR showed sinus tachcyardia, no ischemic changes. Normal myocardial perfusion. Stress LVEF 64%. Low risk study.  Direct-current cardioversion 03/11/2020: Converted to normal sinus rhythm with 120 J of electricity  x1.  Direct-current cardioversion 02/26/2020:  A flutter to A. fib to normal sinus rhythm with 75x 1 then 150 Joules of electricity X 1  PCV ECHOCARDIOGRAM COMPLETE 41/93/7902 Normal LV systolic function with EF 58%. Left ventricle cavity is normal in size. Normal global wall motion. Indeterminate diastolic filling pattern. Calculated EF 58%. Left atrial cavity is mildly dilated. Structurally normal mitral valve.  Mild (Grade I) mitral regurgitation. Structurally normal tricuspid valve with trace regurgitation. No evidence of pulmonary hypertension. Patient appeared to be in atrial flutter during examination. Compared to 03/19/2015, rhythm abnormal now.  Sleep Study 2006: Used CPAP. Repeat sleep study. 05/28/2015 by Dr. Caryl Comes: No e/o sleep apnea.  Chest x-ray 04/30/2015: Mild CHF, atherosclerotic calcification the aortic arch.  Direct current cardioversion 04/29/2015: A. Flutter to NSR with 75J x 1.  Treadmill Exercise stress 10/11/2014: Indication: Palpitations, Chest pain The patient exercised according to Bruce Protocol, Total time recorded 04:59min achieving max heart rate of 141 which was 90% of MPHR for age and 5.80 METS of work. Normal BP response. Resting ECG NSR, low voltage complexes. There was no ST-T changes of ischemia with exercise stress test. Stress terminated due to THR (>85% MPHR)/MPHR met and fatigue. Decreased exercise tolerence.   EKG:   EKG 05/21/2020: Sinus rhythm at a rate of 61 bpm with first-degree AV block.  Normal axis.  Incomplete right bundle branch block.  Poor R wave progression, cannot exclude anteroseptal infarct old.  Compared to EKG 03/25/2020, no significant change.  EKG 03/03/2020: Atrial fibrillation with controlled ventricular response at a rate of 94 bpm.  Normal axis.  Incomplete right bundle branch block.  Poor R wave progression cannot exclude anteroseptal infarct old.  Compared to EKG 02/14/2020, ventricular rate has improved.  EKG 07/27/2019:  Normal sinus rhythm with rate of 64 bpm, left abnormality, normal axis.  No evidence of ischemia, normal EKG.   No significant change from EKG 01/23/2019.   Assessment:     ICD-10-CM   1. Essential hypertension  I10   2. Paroxysmal atrial fibrillation (HCC)  I48.0   3. Dizziness  R42    No orders of the defined types were placed in this encounter.  Medications Discontinued During This Encounter  Medication Reason  . APPLE CIDER VINEGAR PO Error   This patients CHA2DS2-VASc Score 3 (HTN, Age, F) and yearly risk of stroke 3.2%.   Recommendations:    YANELLI ZAPANTA  is a 70 y.o.  with paroxysmal atypical atrial flutter, hyperlipidemia, morbid obesity and hypertension. She was found to be in atrial flutter with rapid ventricular response on 03/13/2015. It was felt to be atypical atrial flutter and was evaluated by Dr. [name], recommended cardioversion, successful direct current cardioversion on 04/29/2015 with no recurrence.  02/14/2020 patient presented in atrial fibrillation with rapid ventricular response and underwent successful cardioversion 02/26/2020.  She again underwent successful cardioversion from atrial fibrillation to sinus rhythm on 03/11/2020.   Patient presents for follow up of hypertension.  She is presently maintaining sinus rhythm and is feeling well without specific complaints today.  She is tolerating current medical management she is tolerating current cardiovascular medications without issue, including Xarelto without bleeding diathesis.  She brings with her home blood pressure readings which are well controlled, although her blood pressure is mildly elevated in the office today.  Will not make changes to her antihypertensive medications.  Advised patient to continue to monitor her blood pressure on a daily basis and to notify our office if consistently becomes >130/80 mmHg.  Encourage patient to continue with diet and lifestyle modifications that she has initiated,  and again discussed the importance of weight loss and reducing overall cardiovascular risk.  In the past patient has been unable to tolerate HCTZ due to worsening renal function.   Otherwise patient is stable from a cardiovascular standpoint.  Follow-up in 6 months, for hypertension, hyperlipidemia, and paroxysmal atrial fibrillation.   Alethia Berthold, PA-C 07/02/2020, 1:41 PM Office: [phone]

## 2020-07-02 ENCOUNTER — Encounter: Payer: Self-pay | Admitting: Student

## 2020-07-02 ENCOUNTER — Other Ambulatory Visit: Payer: Self-pay

## 2020-07-02 ENCOUNTER — Ambulatory Visit: Payer: PPO | Admitting: Student

## 2020-07-02 VITALS — BP 137/57 | HR 60 | Temp 97.1°F | Resp 16 | Ht 66.0 in | Wt 256.2 lb

## 2020-07-02 DIAGNOSIS — I480 Paroxysmal atrial fibrillation: Secondary | ICD-10-CM | POA: Diagnosis not present

## 2020-07-02 DIAGNOSIS — I10 Essential (primary) hypertension: Secondary | ICD-10-CM

## 2020-07-02 DIAGNOSIS — R42 Dizziness and giddiness: Secondary | ICD-10-CM | POA: Diagnosis not present

## 2020-07-09 DIAGNOSIS — R309 Painful micturition, unspecified: Secondary | ICD-10-CM | POA: Diagnosis not present

## 2020-07-09 DIAGNOSIS — N390 Urinary tract infection, site not specified: Secondary | ICD-10-CM | POA: Diagnosis not present

## 2020-07-25 ENCOUNTER — Ambulatory Visit: Payer: PPO | Admitting: Cardiology

## 2020-08-13 DIAGNOSIS — N390 Urinary tract infection, site not specified: Secondary | ICD-10-CM | POA: Diagnosis not present

## 2020-08-24 ENCOUNTER — Other Ambulatory Visit: Payer: Self-pay | Admitting: Student

## 2020-08-27 DIAGNOSIS — M5416 Radiculopathy, lumbar region: Secondary | ICD-10-CM | POA: Diagnosis not present

## 2020-08-27 DIAGNOSIS — I480 Paroxysmal atrial fibrillation: Secondary | ICD-10-CM | POA: Diagnosis not present

## 2020-08-27 DIAGNOSIS — M4697 Unspecified inflammatory spondylopathy, lumbosacral region: Secondary | ICD-10-CM | POA: Diagnosis not present

## 2020-08-28 ENCOUNTER — Other Ambulatory Visit: Payer: Self-pay | Admitting: Internal Medicine

## 2020-08-28 DIAGNOSIS — M4697 Unspecified inflammatory spondylopathy, lumbosacral region: Secondary | ICD-10-CM

## 2020-08-28 DIAGNOSIS — Z01419 Encounter for gynecological examination (general) (routine) without abnormal findings: Secondary | ICD-10-CM | POA: Diagnosis not present

## 2020-08-28 DIAGNOSIS — Z1231 Encounter for screening mammogram for malignant neoplasm of breast: Secondary | ICD-10-CM | POA: Diagnosis not present

## 2020-08-28 DIAGNOSIS — Z6841 Body Mass Index (BMI) 40.0 and over, adult: Secondary | ICD-10-CM | POA: Diagnosis not present

## 2020-08-28 DIAGNOSIS — N390 Urinary tract infection, site not specified: Secondary | ICD-10-CM | POA: Diagnosis not present

## 2020-09-01 ENCOUNTER — Other Ambulatory Visit: Payer: Self-pay | Admitting: Obstetrics and Gynecology

## 2020-09-01 DIAGNOSIS — R928 Other abnormal and inconclusive findings on diagnostic imaging of breast: Secondary | ICD-10-CM

## 2020-09-11 ENCOUNTER — Telehealth: Payer: Self-pay | Admitting: Pharmacist

## 2020-09-11 NOTE — Telephone Encounter (Signed)
My suspicion is low that any of her cardiovascular medications, including flecainide, are contributing to recurrent UTI. As she is highly symptomatic with a fib/flutter would not recommend discontinuation of flecainide at this time. Agree with conservative measures.

## 2020-09-11 NOTE — Telephone Encounter (Signed)
Pt called with concerns of recurrent UTI over the past several months. Being followed by Dr. Richardean Chimera, for OBGYN care. Pt has undergone multiple courses of Abx. Pt using OTC cranberry capsules. Pt called to discuss if her recurrent UTI are medication related or not. Reviewed and updated pt's med list together. Most recent med changes includes flecainide in Dec 2021, and HCTZ in 05/2020. HCTZ d/c due to worsening renal function. HCTZ switched to amlodipine around 06/2020. Unclear of any identifiable drug-induced UTI concerns. Encouraged pt to maintain good hydration, drinking cranberry juice, using OTC AZO.   Flecainide noted to have <1% risk of urinary retention. Unsure if there is any potential link to pt's symptoms. Discussed with [facility]

## 2020-09-12 DIAGNOSIS — N390 Urinary tract infection, site not specified: Secondary | ICD-10-CM | POA: Diagnosis not present

## 2020-09-12 NOTE — Telephone Encounter (Signed)
Called and discussed with pt. Pt to reach out to her OBGYN and urologist for further evaluation

## 2020-09-15 ENCOUNTER — Ambulatory Visit
Admission: RE | Admit: 2020-09-15 | Discharge: 2020-09-15 | Disposition: A | Discharge status: Home / Self Care | Payer: PPO | Source: Ambulatory Visit | Attending: Internal Medicine | Admitting: Internal Medicine

## 2020-09-15 ENCOUNTER — Other Ambulatory Visit: Payer: Self-pay

## 2020-09-15 DIAGNOSIS — M48061 Spinal stenosis, lumbar region without neurogenic claudication: Secondary | ICD-10-CM | POA: Diagnosis not present

## 2020-09-15 DIAGNOSIS — M4697 Unspecified inflammatory spondylopathy, lumbosacral region: Secondary | ICD-10-CM

## 2020-09-15 MED ORDER — GADOBENATE DIMEGLUMINE 529 MG/ML IV SOLN
20.0000 mL | Freq: Once | INTRAVENOUS | Status: AC | PRN
  Administered 2020-09-15: 20 mL via INTRAVENOUS

## 2020-09-17 ENCOUNTER — Other Ambulatory Visit: Payer: Self-pay

## 2020-09-17 ENCOUNTER — Ambulatory Visit: Payer: PPO

## 2020-09-17 ENCOUNTER — Ambulatory Visit
Admission: RE | Admit: 2020-09-17 | Discharge: 2020-09-17 | Disposition: A | Discharge status: Home / Self Care | Payer: PPO | Source: Ambulatory Visit | Attending: Obstetrics and Gynecology | Admitting: Obstetrics and Gynecology

## 2020-09-17 DIAGNOSIS — N6489 Other specified disorders of breast: Secondary | ICD-10-CM | POA: Diagnosis not present

## 2020-09-17 DIAGNOSIS — R928 Other abnormal and inconclusive findings on diagnostic imaging of breast: Secondary | ICD-10-CM | POA: Diagnosis not present

## 2020-09-20 ENCOUNTER — Other Ambulatory Visit: Payer: Self-pay | Admitting: Cardiology

## 2020-09-26 ENCOUNTER — Other Ambulatory Visit: Payer: Self-pay

## 2020-09-26 ENCOUNTER — Other Ambulatory Visit: Payer: Self-pay | Admitting: Internal Medicine

## 2020-09-26 ENCOUNTER — Other Ambulatory Visit: Payer: Self-pay | Admitting: Cardiology

## 2020-09-26 DIAGNOSIS — M545 Low back pain, unspecified: Secondary | ICD-10-CM

## 2020-09-30 ENCOUNTER — Ambulatory Visit: Payer: PPO | Admitting: Urology

## 2020-09-30 ENCOUNTER — Encounter: Payer: Self-pay | Admitting: Urology

## 2020-09-30 ENCOUNTER — Other Ambulatory Visit: Payer: Self-pay

## 2020-09-30 VITALS — BP 174/82 | HR 81 | Ht 66.0 in | Wt 246.0 lb

## 2020-09-30 DIAGNOSIS — N8111 Cystocele, midline: Secondary | ICD-10-CM

## 2020-09-30 DIAGNOSIS — N390 Urinary tract infection, site not specified: Secondary | ICD-10-CM | POA: Diagnosis not present

## 2020-09-30 LAB — BLADDER SCAN AMB NON-IMAGING: Scan Result: 20

## 2020-09-30 MED ORDER — ESTRADIOL 0.1 MG/GM VA CREA: TOPICAL_CREAM | VAGINAL | 12 refills | Status: DC

## 2020-09-30 NOTE — Progress Notes (Signed)
09/30/2020 2:14 PM   [name] [name] 05-24-1950 [id number]  Referring provider: Geoffry Paradise, MD [address] [place],  [place] [address]  Chief Complaint  Patient presents with  . Urinary Tract Infection    HPI: 70 year old female who presents today for further evaluation of possible recurrent urinary tract infections.  There is note in the computer mentioning that she has been treated for recurrent UTIs by her OB/GYN physician, Dr. Richardean [name].  In this phone message, she was reaching out to her pharmacist to see if any of her medications could be contributing.  Unfortunately, we were sent no urinalysis or urine culture data to support the above, release signed today to obtain these records.  She reports today that over the past 6 months especially, she is having recurrent urinary tract infections.  She reports that she has been prescribed antibiotics by her OB/GYN at least 5 or 6 times and then again by her primary care physician at least 1 time in the recent past.  It will generally go just a few days in between her antibiotic courses and her symptoms recur.  She primarily complains of burning at the urethra as well as external burning after urinating.  She also has some associated urgency frequency.  This is acutely worse during her "UTIs".  She is nondiabetic.  No issues with constipation, takes MiraLAX as needed to help with this.  No history of kidney stones, febrile UTIs, or any other GU issues.  No recent upper tract imaging.  She does report that she has a known mild cystocele.  She reports that this is "aggravating" and she is worried about her bladder falling out.  In the morning time, she does not have any symptoms but as a day progresses, she feels her bladder lower and lower chest within side of the vaginal opening.  She is currently not symptomatic.  Its been 10 days since she last had antibiotics.  She is using cranberry tablets.  PMH: Past Medical History:   Diagnosis Date  . Anxiety and depression   . Aortic arch atherosclerosis (HCC) 07/13/2018  . Exogenous obesity   . Fibromyalgia   . Hyperlipidemia   . Hypothyroidism   . Morbid obesity (HCC)   . OSA (obstructive sleep apnea)    uses CPAP  . Palpitations 07/13/2018  . Paroxysmal atrial flutter (HCC)   . Periodic limb movement disorder   . REM sleep behavior disorder   . Rhinitis   . SOB (shortness of breath)   . Spinal stenosis   . Unspecified hypothyroidism     Surgical History: Past Surgical History:  Procedure Laterality Date  . CARDIOVERSION N/A 04/29/2015   Procedure: CARDIOVERSION;  Surgeon: Yates Decamp, MD;  Location: [facility] ENDOSCOPY;  Service: Cardiovascular;  Laterality: N/A;  . CARDIOVERSION N/A 02/26/2020   Procedure: CARDIOVERSION;  Surgeon: Yates Decamp, MD;  Location: [facility] ENDOSCOPY;  Service: Cardiovascular;  Laterality: N/A;  . CARDIOVERSION N/A 03/11/2020   Procedure: CARDIOVERSION;  Surgeon: Yates Decamp, MD;  Location: [facility] ENDOSCOPY;  Service: Cardiovascular;  Laterality: N/A;  . CARPAL TUNNEL RELEASE     Bilateral  . DILATION AND CURETTAGE OF UTERUS    . left rotator cuff    . resection uterine polyp  2011    Home Medications:  Allergies as of 09/30/2020      Reactions   Azithromycin Other (See Comments)   unknown   Codeine Nausea And Vomiting   Morphine Nausea And Vomiting      Medication  List       Accurate as of Sep 30, 2020  2:14 PM. If you have any questions, ask your nurse or doctor.        acetaminophen 500 MG tablet Commonly known as: TYLENOL Take 500-1,000 mg by mouth every 6 (six) hours as needed (for pain.).   amLODipine 5 MG tablet Commonly known as: NORVASC TAKE 1 TABLET (5 MG TOTAL) BY MOUTH DAILY. What changed:   how much to take  additional instructions   Cranberry 1000 MG Caps Take 1 capsule by mouth daily.   flecainide 100 MG tablet Commonly known as: TAMBOCOR Take 1 tablet (100 mg total) by mouth 2 (two) times daily.    levothyroxine 175 MCG tablet Commonly known as: SYNTHROID Take 175 mcg by mouth daily.   metoprolol tartrate 25 MG tablet Commonly known as: LOPRESSOR Take 1 tablet (25 mg total) by mouth in the morning, at noon, and at bedtime.   rivaroxaban 20 MG Tabs tablet Commonly known as: Xarelto Take 1 tablet (20 mg total) by mouth every evening.   rosuvastatin 10 MG tablet Commonly known as: CRESTOR TAKE ONE HALF TABLET BY MOUTH ONCE A DAY.   VITAMIN B COMPLEX-C PO Take 1 capsule by mouth daily.   Vitamin C 500 MG Caps Take 500 mg by mouth every evening.   Vitamin D-3 125 MCG (5000 UT) Tabs Take 5,000 mg by mouth daily.       Allergies:  Allergies  Allergen Reactions  . Azithromycin Other (See Comments)    unknown  . Codeine Nausea And Vomiting  . Morphine Nausea And Vomiting    Family History: Family History  Adopted: Yes  Problem Relation Age of Onset  . Cervical cancer Mother     Social History:  reports that she has never smoked. She has never used smokeless tobacco. She reports that she does not drink alcohol and does not use drugs.   Physical Exam: BP (!) 174/82   Pulse 81   Ht 5\' 6"  (1.676 m)   Wt 246 lb (111.6 kg)   BMI 39.71 kg/m   Constitutional:  Alert and oriented, No acute distress. HEENT: [place] AT, moist mucus membranes.  Trachea midline, no masses. Cardiovascular: No clubbing, cyanosis, or edema. Respiratory: Normal respiratory effort, no increased work of breathing. GI: Abdomen is soft, nontender, nondistended, no abdominal masses Skin: No rashes, bruises or suspicious lesions. Neurologic: Grossly intact, no focal deficits, moving all 4 extremities. Psychiatric: Normal mood and affect.  Laboratory Data: Lab Results  Component Value Date   WBC 10.2 02/14/2020   HGB 13.9 03/11/2020   HCT 41.0 03/11/2020   MCV 91 02/14/2020   PLT 196 02/14/2020    Lab Results  Component Value Date   CREATININE 1.16 (H) 05/28/2020     Urinalysis Urinalysis today with 10-30 WBCs, nitrate negative, greater than 10 epithelials  Pertinent Imaging: Results for orders placed or performed in visit on 09/30/20  Bladder Scan (Post Void Residual) in office  Result Value Ref Range   Scan Result 20     Assessment & Plan:    1. Recurrent UTI Records release signed today to document that this is the true/correct diagnosis  Like her to go ahead and continue cranberry tablets as well as add a daily probiotic  In addition to the above, we discussed topical estrogen cream and the efficacy/data around its utilization in postmenopausal women.  We discussed the low systemic absorption rate.  This may also help with her  vaginal/cystocele symptoms.  Plan to use pea-sized amount 3 times a week for urethral meatus.  In the interim, if she becomes symptomatic, like her to come in for cath urinalysis as her urine appears to be contaminated and she had some issues with urine collection today.  Will plan for pelvic exam at next visit as well to document her vaginal health as well as further assess her cystocele. - Urinalysis, Complete - Bladder Scan (Post Void Residual) in office  2. Cystocele, midline Adequate bladder emptying today, no evidence of urinary retention or incomplete emptying is contributing factor  We discussed the benefits of cystocele repair.  We will evaluate with a vaginal exam and next visit.   Return in about 1 month (around 10/30/2020) for records review and symptoms check./ pelvic exam  Vanna Scotland, MD  [facility] [address], [address] [place], [place] [address] [phone]

## 2020-09-30 NOTE — Patient Instructions (Addendum)
Take probiotic daily and cranberry tablets daily Apply pea size amount to urethra-Monday, Wednesday and Friday night If having UTI symptoms make a same day appointment

## 2020-10-01 LAB — URINALYSIS, COMPLETE
Bilirubin, UA: NEGATIVE
Glucose, UA: NEGATIVE
Ketones, UA: NEGATIVE
Nitrite, UA: NEGATIVE
Protein,UA: NEGATIVE
RBC, UA: NEGATIVE
Specific Gravity, UA: 1.01 (ref 1.005–1.030)
Urobilinogen, Ur: 2 mg/dL — ABNORMAL HIGH (ref 0.2–1.0)
pH, UA: 7 (ref 5.0–7.5)

## 2020-10-01 LAB — MICROSCOPIC EXAMINATION
Bacteria, UA: NONE SEEN
Epithelial Cells (non renal): >10 /hpf — AB (ref 0–10)
RBC, Urine: NONE SEEN /hpf (ref 0–2)

## 2020-10-02 ENCOUNTER — Encounter: Payer: Self-pay | Admitting: Cardiology

## 2020-10-09 ENCOUNTER — Other Ambulatory Visit: Payer: Self-pay

## 2020-10-09 ENCOUNTER — Ambulatory Visit
Admission: RE | Admit: 2020-10-09 | Discharge: 2020-10-09 | Disposition: A | Discharge status: Home / Self Care | Payer: PPO | Source: Ambulatory Visit | Attending: Internal Medicine | Admitting: Internal Medicine

## 2020-10-09 DIAGNOSIS — M48061 Spinal stenosis, lumbar region without neurogenic claudication: Secondary | ICD-10-CM | POA: Diagnosis not present

## 2020-10-09 DIAGNOSIS — M47817 Spondylosis without myelopathy or radiculopathy, lumbosacral region: Secondary | ICD-10-CM | POA: Diagnosis not present

## 2020-10-09 DIAGNOSIS — M545 Low back pain, unspecified: Secondary | ICD-10-CM

## 2020-10-09 MED ORDER — IOPAMIDOL (ISOVUE-M 200) INJECTION 41%
1.0000 mL | Freq: Once | INTRAMUSCULAR | Status: AC
  Administered 2020-10-09: 12:00:00 1 mL via EPIDURAL

## 2020-10-09 MED ORDER — METHYLPREDNISOLONE ACETATE 40 MG/ML INJ SUSP (RADIOLOG
80.0000 mg | Freq: Once | INTRAMUSCULAR | Status: AC
  Administered 2020-10-09: 12:00:00 80 mg via EPIDURAL

## 2020-10-09 NOTE — Discharge Instructions (Addendum)
Post Procedure Spinal Discharge Instruction Sheet  You may resume a regular diet and any medications that you routinely take (including pain medications).  No driving day of procedure.  Light activity throughout the rest of the day.  Do not do any strenuous work, exercise, bending or lifting.  The day following the procedure, you can resume normal physical activity but you should refrain from exercising or physical therapy for at least three days thereafter.   Common Side Effects:  Headaches- take your usual medications as directed by your physician.  Increase your fluid intake.  Caffeinated beverages may be helpful.  Lie flat in bed until your headache resolves.  Restlessness or inability to sleep- you may have trouble sleeping for the next few days.  Ask your referring physician if you need any medication for sleep.  Facial flushing or redness- should subside within a few days.  Increased pain- a temporary increase in pain a day or two following your procedure is not unusual.  Take your pain medication as prescribed by your referring physician.  Leg cramps  Please contact our office at [phone] for the following symptoms: Fever greater than 100 degrees. Headaches unresolved with medication after 2-3 days. Increased swelling, pain, or redness at injection site.   Thank you for visiting [facility] today.    YOU MAY RESUME YOUR XARELTO 24 HOURS AFTER INJECTION (10/10/20 AT 11:30 AM)

## 2020-10-31 ENCOUNTER — Telehealth: Payer: Self-pay | Admitting: *Deleted

## 2020-10-31 NOTE — Telephone Encounter (Signed)
Spoke with [name] [name] at Dr. Lisbeth Ply office faxed records request on 10/16/20-re-faxed request to send records

## 2020-11-04 NOTE — Telephone Encounter (Signed)
Sw someone at Dr. Gaye Alken office. States will fax records over this afternoon.

## 2020-11-05 ENCOUNTER — Encounter: Payer: Self-pay | Admitting: Urology

## 2020-11-05 ENCOUNTER — Ambulatory Visit: Payer: PPO | Admitting: Urology

## 2020-11-05 ENCOUNTER — Other Ambulatory Visit: Payer: Self-pay

## 2020-11-05 VITALS — BP 139/79 | HR 61 | Ht 66.0 in | Wt 246.0 lb

## 2020-11-05 DIAGNOSIS — N8111 Cystocele, midline: Secondary | ICD-10-CM | POA: Diagnosis not present

## 2020-11-05 DIAGNOSIS — N390 Urinary tract infection, site not specified: Secondary | ICD-10-CM

## 2020-11-05 DIAGNOSIS — M5416 Radiculopathy, lumbar region: Secondary | ICD-10-CM | POA: Diagnosis not present

## 2020-11-05 DIAGNOSIS — N952 Postmenopausal atrophic vaginitis: Secondary | ICD-10-CM | POA: Diagnosis not present

## 2020-11-05 DIAGNOSIS — M4697 Unspecified inflammatory spondylopathy, lumbosacral region: Secondary | ICD-10-CM | POA: Diagnosis not present

## 2020-11-05 DIAGNOSIS — H1033 Unspecified acute conjunctivitis, bilateral: Secondary | ICD-10-CM | POA: Diagnosis not present

## 2020-11-05 LAB — URINALYSIS, COMPLETE
Bilirubin, UA: NEGATIVE
Glucose, UA: NEGATIVE
Ketones, UA: NEGATIVE
Nitrite, UA: NEGATIVE
Protein,UA: NEGATIVE
Specific Gravity, UA: 1.02 (ref 1.005–1.030)
Urobilinogen, Ur: 1 mg/dL (ref 0.2–1.0)
pH, UA: 6 (ref 5.0–7.5)

## 2020-11-05 LAB — MICROSCOPIC EXAMINATION

## 2020-11-05 NOTE — Progress Notes (Signed)
11/05/2020 10:29 AM   [name] [name] 09-27-1950 [id number]  Referring provider: Geoffry Paradise, MD [address] [place],  [place] [address]  Chief Complaint  Patient presents with   Recurrent UTI    HPI: 70 year old female who returns today for follow-up and pelvic exam.  Since last visit, she has been using estrogen cream 3 times a week.  She reports that this is already made marked improvement in her urinary symptoms including discomfort with wiping as well as external irritation.  She reports the medication has been a "godsend".  We did also receive her records from her UTIs.  She was treated for an Enterococcus UTI with 25-50 K in 08/2020 as well as E. coli in 3/22 50-100 K.  She had a mixed flora urine culture in 03/2020.  She has no UTI symptoms today.  No urgency or frequency.    At last visit, she mentioned about a chronic pelvic organ prolapse.  She reports that she feels pressure and bulging in her vagina at times.  She actually reports today that since starting estrogen cream, this is also improved.  Her OB/GYN has not been excited about repairing her prolapse.  She still has a uterus.   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   Exogenous obesity    Fibromyalgia    Hyperlipidemia    Hypothyroidism    Morbid obesity (HCC)    OSA (obstructive sleep apnea)    uses CPAP   Palpitations 07/13/2018   Paroxysmal atrial flutter (HCC)    Periodic limb movement disorder    REM sleep behavior disorder    Rhinitis    SOB (shortness of breath)    Spinal stenosis    Unspecified hypothyroidism     Surgical History: Past Surgical History:  Procedure Laterality Date   CARDIOVERSION N/A 04/29/2015   Procedure: CARDIOVERSION;  Surgeon: Yates Decamp, MD;  Location: [facility] ENDOSCOPY;  Service: Cardiovascular;  Laterality: N/A;   CARDIOVERSION N/A 02/26/2020   Procedure: CARDIOVERSION;  Surgeon: Yates Decamp, MD;  Location: [facility] ENDOSCOPY;  Service: Cardiovascular;  Laterality: N/A;   CARDIOVERSION N/A 03/11/2020   Procedure: CARDIOVERSION;  Surgeon: Yates Decamp, MD;  Location: [facility] ENDOSCOPY;  Service: Cardiovascular;  Laterality: N/A;   CARPAL TUNNEL RELEASE     Bilateral   DILATION AND CURETTAGE OF UTERUS     left rotator cuff     resection uterine polyp  2011    Home Medications:  Allergies as of 11/05/2020       Reactions   Azithromycin Other (See Comments)   unknown   Codeine Nausea And Vomiting   Morphine Nausea And Vomiting        Medication List        Accurate as of November 05, 2020 10:29 AM. If you have any questions, ask your nurse or doctor.          acetaminophen 500 MG tablet Commonly known as: TYLENOL Take 500-1,000 mg by mouth every 6 (six) hours as needed (for pain.).   amLODipine 5 MG tablet Commonly known as: NORVASC TAKE 1 TABLET (5 MG TOTAL) BY MOUTH DAILY. What changed:  how much to take additional instructions   Cranberry 1000 MG Caps Take 1 capsule by mouth daily.   estradiol 0.1 MG/GM vaginal cream Commonly known as: ESTRACE Discard applicator-apply one pea size amount to urethra Monday, Wed and Friday night   flecainide 100 MG tablet  Commonly known as: TAMBOCOR Take 1 tablet (100 mg total) by mouth 2 (two) times daily.   levothyroxine 175 MCG tablet Commonly known as: SYNTHROID Take 175 mcg by mouth daily.   metoprolol tartrate 25 MG tablet Commonly known as: LOPRESSOR Take 1 tablet (25 mg total) by mouth in the morning, at noon, and at bedtime.   rivaroxaban 20 MG Tabs tablet Commonly known as: Xarelto Take 1 tablet (20 mg total) by mouth every evening.   rosuvastatin 10 MG tablet Commonly known as: CRESTOR TAKE ONE HALF TABLET BY MOUTH ONCE A DAY.   VITAMIN B COMPLEX-C PO Take 1 capsule by mouth daily.   Vitamin C 500 MG Caps Take 500 mg by mouth every evening.   Vitamin D-3 125 MCG (5000 UT) Tabs Take 5,000 mg by mouth  daily.        Allergies:  Allergies  Allergen Reactions   Azithromycin Other (See Comments)    unknown   Codeine Nausea And Vomiting   Morphine Nausea And Vomiting    Family History: Family History  Adopted: Yes  Problem Relation Age of Onset   Cervical cancer Mother     Social History:  reports that she has never smoked. She has never used smokeless tobacco. She reports that she does not drink alcohol and does not use drugs.   Physical Exam: BP 139/79   Pulse 61   Ht 5\' 6"  (1.676 m)   Wt 246 lb (111.6 kg)   BMI 39.71 kg/m   Constitutional:  Alert and oriented, No acute distress. HEENT:  AT, moist mucus membranes.  Trachea midline, no masses. Cardiovascular: No clubbing, cyanosis, or edema. Pelvic exam, chaperoned by CMA .  Normal external genitalia.  Significant diffuse atrophic vaginal and periurethral tissue with somewhat patulous urethra without significant hypermobility or demonstrable stress urinary incontinence.  She does have fairly significant vaginal vault prolapse including the apical with the cervix and bladder slightly beyond the introitus with Valsalva fairly good posterior vault support. Skin: No rashes, bruises or suspicious lesions. Neurologic: Grossly intact, no focal deficits, moving all 4 extremities. Psychiatric: Normal mood and affect.  Urinalysis today is fairly unremarkable   Assessment & Plan:    1. Recurrent UTI Continue topical estrogen cream, see as needed for any breakthrough UTIs - Urinalysis, Complete  2. Atrophic vaginitis Continue topical estrogen cream as above, dramatic relief after just 4 weeks  3. 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  We briefly discussed the procedure itself as well as minimally invasive approach.  She may  consider this would like to focus on her back for the time being.  If she does elect to pursue this, we discussed the role of urodynamics in the preoperative evaluation.  Follow-up annually or sooner as needed  Gerarda Gunther, MD  [facility] [address] [place], [place] [phone]  I spent 35 total minutes on the day of the encounter including pre-visit review of the medical record, face-to-face time with the patient, and post visit ordering of labs/imaging/tests.

## 2020-11-18 ENCOUNTER — Telehealth: Payer: Self-pay | Admitting: Family Medicine

## 2020-11-18 DIAGNOSIS — N8111 Cystocele, midline: Secondary | ICD-10-CM

## 2020-11-18 NOTE — Telephone Encounter (Signed)
It would be done here by me and I would have Dr. Marlou Porch assist (if he is able/willing); if she is serious about moving forward, the neck step would be to send her over to [facility] for urodynamics to evaluate her bladder function.  We could actually have this scheduled with Dr. Marlou Porch himself so that he had the opportunity to meet her.  If she would like to do this, please let me know and we can help arrange this.  Vanna Scotland, MD

## 2020-11-18 NOTE — Telephone Encounter (Signed)
Patient called and states she has been using the Premarin cream, it does help however she still has that pulling sensation. She states you talked to her about having a surgery and she is interested in talking to that doctor about it. She could not remember the Dr. Name you mentioned to her. Please advise

## 2020-11-19 NOTE — Telephone Encounter (Signed)
Patient informed  would like to proceed with next step, reviewed Urodynamics in detail-placed referral for UDS-Dr. Marlou Porch. Patient has been having dysuria and cloudy urine for a couple of days. Denies any other symptoms. Scheduled appointment for evaluation in clinic tomorrow.

## 2020-11-20 ENCOUNTER — Encounter: Payer: Self-pay | Admitting: Physician Assistant

## 2020-11-20 ENCOUNTER — Other Ambulatory Visit: Payer: Self-pay

## 2020-11-20 ENCOUNTER — Ambulatory Visit: Payer: PPO | Admitting: Physician Assistant

## 2020-11-20 VITALS — BP 142/83 | HR 61 | Ht 66.0 in | Wt 248.0 lb

## 2020-11-20 DIAGNOSIS — R300 Dysuria: Secondary | ICD-10-CM | POA: Diagnosis not present

## 2020-11-20 MED ORDER — SULFAMETHOXAZOLE-TRIMETHOPRIM 800-160 MG PO TABS: 1.0000 | ORAL_TABLET | Freq: Two times a day (BID) | ORAL | 0 refills | Status: AC

## 2020-11-20 NOTE — Progress Notes (Signed)
11/20/2020 11:51 AM   [name] [birth year] [id number]  CC: Chief Complaint  Patient presents with   Dysuria   HPI: [name] [name] is a 70 y.o. female with PMH recurrent UTI and atrophic vaginitis on topical vaginal estrogen cream as well as stage II/III cystocele who presents today for evaluation of possible UTI.   Today she reports a 6-day history of dysuria and frequency.  She denies fever, chills, nausea, vomiting, flank pain, and gross hematuria.  She has not taken any medication at home for management of her symptoms.  In-office UA today positive for trace lysed blood, nitrites, and 2+ leukocyte esterase; urine microscopy with >30 WBCs/HPF and many bacteria.  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   Exogenous obesity    Fibromyalgia    Hyperlipidemia    Hypothyroidism    Morbid obesity (HCC)    OSA (obstructive sleep apnea)    uses CPAP   Palpitations 07/13/2018   Paroxysmal atrial flutter (HCC)    Periodic limb movement disorder    REM sleep behavior disorder    Rhinitis    SOB (shortness of breath)    Spinal stenosis    Unspecified hypothyroidism     Surgical History: Past Surgical History:  Procedure Laterality Date   CARDIOVERSION N/A 04/29/2015   Procedure: CARDIOVERSION;  Surgeon: Yates Decamp, MD;  Location: [facility] ENDOSCOPY;  Service: Cardiovascular;  Laterality: N/A;   CARDIOVERSION N/A 02/26/2020   Procedure: CARDIOVERSION;  Surgeon: Yates Decamp, MD;  Location: [facility] ENDOSCOPY;  Service: Cardiovascular;  Laterality: N/A;   CARDIOVERSION N/A 03/11/2020   Procedure: CARDIOVERSION;  Surgeon: Yates Decamp, MD;  Location: [facility] ENDOSCOPY;  Service: Cardiovascular;  Laterality: N/A;   CARPAL TUNNEL RELEASE     Bilateral   DILATION AND CURETTAGE OF UTERUS     left rotator cuff     resection uterine polyp  2011    Home Medications:  Allergies as of 11/20/2020       Reactions    Azithromycin Other (See Comments)   unknown   Codeine Nausea And Vomiting   Morphine Nausea And Vomiting        Medication List        Accurate as of November 20, 2020 11:51 AM. If you have any questions, ask your nurse or doctor.          acetaminophen 500 MG tablet Commonly known as: TYLENOL Take 500-1,000 mg by mouth every 6 (six) hours as needed (for pain.).   amLODipine 5 MG tablet Commonly known as: NORVASC TAKE 1 TABLET (5 MG TOTAL) BY MOUTH DAILY. What changed:  how much to take additional instructions   Cranberry 1000 MG Caps Take 1 capsule by mouth daily.   estradiol 0.1 MG/GM vaginal cream Commonly known as: ESTRACE Discard applicator-apply one pea size amount to urethra Monday, Wed and Friday night   flecainide 100 MG tablet Commonly known as: TAMBOCOR Take 1 tablet (100 mg total) by mouth 2 (two) times daily.   levothyroxine 175 MCG tablet Commonly known as: SYNTHROID Take 175 mcg by mouth daily.   metoprolol tartrate 25 MG tablet Commonly known as: LOPRESSOR Take 1 tablet (25 mg total) by mouth in the morning, at noon, and at bedtime.   rivaroxaban 20 MG Tabs tablet Commonly known as: Xarelto Take 1 tablet (20 mg total) by mouth every evening.   rosuvastatin 10 MG tablet Commonly known as: CRESTOR  TAKE ONE HALF TABLET BY MOUTH ONCE A DAY.   VITAMIN B COMPLEX-C PO Take 1 capsule by mouth daily.   Vitamin C 500 MG Caps Take 500 mg by mouth every evening.   Vitamin D-3 125 MCG (5000 UT) Tabs Take 5,000 mg by mouth daily.        Allergies:  Allergies  Allergen Reactions   Azithromycin Other (See Comments)    unknown   Codeine Nausea And Vomiting   Morphine Nausea And Vomiting    Family History: Family History  Adopted: Yes  Problem Relation Age of Onset   Cervical cancer Mother     Social History:   reports that she has never smoked. She has never used smokeless tobacco. She reports that she does not drink alcohol and does  not use drugs.  Physical Exam: BP (!) 142/83   Pulse 61   Ht 5\' 6"  (1.676 m)   Wt 248 lb (112.5 kg)   BMI 40.03 kg/m   Constitutional:  Alert and oriented, no acute distress, nontoxic appearing HEENT: , AT Cardiovascular: No clubbing, cyanosis, or edema Respiratory: Normal respiratory effort, no increased work of breathing Skin: No rashes, bruises or suspicious lesions Neurologic: Grossly intact, no focal deficits, moving all 4 extremities Psychiatric: Normal mood and affect  Laboratory Data: Results for orders placed or performed in visit on 11/20/20  CULTURE, URINE COMPREHENSIVE   Specimen: Urine   UR  Result Value Ref Range   Urine Culture, Comprehensive Final report (A)    Organism ID, Bacteria Citrobacter freundii (A)    ANTIMICROBIAL SUSCEPTIBILITY Comment   Microscopic Examination   Urine  Result Value Ref Range   WBC, UA >30 (A) 0 - 5 /hpf   RBC 0-2 0 - 2 /hpf   Epithelial Cells (non renal) 0-10 0 - 10 /hpf   Bacteria, UA Many (A) None seen/Few  Urinalysis, Complete  Result Value Ref Range   Specific Gravity, UA 1.020 1.005 - 1.030   pH, UA 5.5 5.0 - 7.5   Color, UA Yellow Yellow   Appearance Ur Cloudy (A) Clear   Leukocytes,UA 2+ (A) Negative   Protein,UA Negative Negative/Trace   Glucose, UA Negative Negative   Ketones, UA Negative Negative   RBC, UA Trace (A) Negative   Bilirubin, UA Negative Negative   Urobilinogen, Ur 0.2 0.2 - 1.0 mg/dL   Nitrite, UA Positive (A) Negative   Microscopic Examination See below:    Assessment & Plan:   1. Dysuria UA grossly infected today, will start empiric Bactrim and send for culture for further evaluation.  Counseled patient to continue topical vaginal estrogen cream.  She expressed understanding. - Urinalysis, Complete - CULTURE, URINE COMPREHENSIVE - sulfamethoxazole-trimethoprim (BACTRIM DS) 800-160 MG tablet; Take 1 tablet by mouth 2 (two) times daily for 5 days.  Dispense: 10 tablet; Refill: 0   Return if  symptoms worsen or fail to improve.  11/22/20, PA-C  [facility] [address], [address] [place], [place] [phone]

## 2020-11-21 LAB — MICROSCOPIC EXAMINATION: WBC, UA: >30 /hpf — AB (ref 0–5)

## 2020-11-21 LAB — URINALYSIS, COMPLETE
Bilirubin, UA: NEGATIVE
Glucose, UA: NEGATIVE
Ketones, UA: NEGATIVE
Nitrite, UA: POSITIVE — AB
Protein,UA: NEGATIVE
Specific Gravity, UA: 1.02 (ref 1.005–1.030)
Urobilinogen, Ur: 0.2 mg/dL (ref 0.2–1.0)
pH, UA: 5.5 (ref 5.0–7.5)

## 2020-11-24 LAB — CULTURE, URINE COMPREHENSIVE

## 2020-12-22 DIAGNOSIS — N3946 Mixed incontinence: Secondary | ICD-10-CM | POA: Diagnosis not present

## 2020-12-25 ENCOUNTER — Other Ambulatory Visit: Payer: Self-pay | Admitting: Urology

## 2020-12-30 NOTE — Progress Notes (Signed)
12/31/20 2:18 PM   [name] 1950-08-08 [id number]  Referring provider:  Geoffry Paradise, MD [address] [place],  [place] [address] Chief Complaint  Patient presents with   Follow-up     HPI: [name] [name] is a 70 y.o.female with a personal history of recurrent UTI and atrophic vaginitis on topical vaginal estrogen cream as well as stage II/III cystocele who returns today for UDS results.   8/22/022 UDS showed she held a max capacity of approx. 145 mls. There was positive SUI. She leaked a very mild amount with coughing and without prolapse reductions. She triggered a contraction each time with valsalva. There was positive instability. She has both provoked and unprovoked contractions and was able to generate voluntary contraction and void 147 mls with no packing. PVR was approx. 52 mls at the time. Video component not done due to contrast shortage.  Assessment was demonstrable mixed incontinence both with a without reduction of her prolapse, she had no evidence of pelvic floor dysfunction.   She states today that she is not bothered by the leakage but she is bothered by the prolapse.    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   Exogenous obesity    Fibromyalgia    Hyperlipidemia    Hypothyroidism    Morbid obesity (HCC)    OSA (obstructive sleep apnea)    uses CPAP   Palpitations 07/13/2018   Paroxysmal atrial flutter (HCC)    Periodic limb movement disorder    REM sleep behavior disorder    Rhinitis    SOB (shortness of breath)    Spinal stenosis    Unspecified hypothyroidism     Surgical History: Past Surgical History:  Procedure Laterality Date   CARDIOVERSION N/A 04/29/2015   Procedure: CARDIOVERSION;  Surgeon: Yates Decamp, MD;  Location: [facility] ENDOSCOPY;  Service: Cardiovascular;  Laterality: N/A;   CARDIOVERSION N/A 02/26/2020   Procedure: CARDIOVERSION;  Surgeon: Yates Decamp, MD;   Location: [facility] ENDOSCOPY;  Service: Cardiovascular;  Laterality: N/A;   CARDIOVERSION N/A 03/11/2020   Procedure: CARDIOVERSION;  Surgeon: Yates Decamp, MD;  Location: [facility] ENDOSCOPY;  Service: Cardiovascular;  Laterality: N/A;   CARPAL TUNNEL RELEASE     Bilateral   DILATION AND CURETTAGE OF UTERUS     left rotator cuff     resection uterine polyp  2011    Home Medications:  Allergies as of 12/31/2020       Reactions   Aspirin Other (See Comments)   Azithromycin Other (See Comments)   unknown   Loratadine Other (See Comments)   Sulfa Antibiotics    Other reaction(s): Unknown   Codeine Nausea And Vomiting   Morphine Nausea And Vomiting        Medication List        Accurate as of December 31, 2020  2:18 PM. If you have any questions, ask your nurse or doctor.          acetaminophen 500 MG tablet Commonly known as: TYLENOL Take 500-1,000 mg by mouth every 6 (six) hours as needed (for pain.).   amLODipine 5 MG tablet Commonly known as: NORVASC TAKE 1 TABLET (5 MG TOTAL) BY MOUTH DAILY. What changed:  how much to take additional instructions   Cranberry 1000 MG Caps Take 1 capsule by mouth daily.   estradiol 0.1 MG/GM vaginal cream Commonly known as: ESTRACE Discard applicator-apply one pea size amount to urethra Monday,  Wed and Friday night   flecainide 100 MG tablet Commonly known as: TAMBOCOR Take 1 tablet (100 mg total) by mouth 2 (two) times daily.   levothyroxine 175 MCG tablet Commonly known as: SYNTHROID Take 175 mcg by mouth daily.   metoprolol tartrate 25 MG tablet Commonly known as: LOPRESSOR Take 1 tablet (25 mg total) by mouth in the morning, at noon, and at bedtime.   rivaroxaban 20 MG Tabs tablet Commonly known as: Xarelto Take 1 tablet (20 mg total) by mouth every evening.   rosuvastatin 10 MG tablet Commonly known as: CRESTOR TAKE ONE HALF TABLET BY MOUTH ONCE A DAY.   VITAMIN B COMPLEX-C PO Take 1 capsule by mouth daily.   Vitamin C  500 MG Caps Take 500 mg by mouth every evening.   Vitamin D-3 125 MCG (5000 UT) Tabs Take 5,000 mg by mouth daily.        Allergies:  Allergies  Allergen Reactions   Aspirin Other (See Comments)   Azithromycin Other (See Comments)    unknown   Loratadine Other (See Comments)   Sulfa Antibiotics     Other reaction(s): Unknown   Codeine Nausea And Vomiting   Morphine Nausea And Vomiting    Family History: Family History  Adopted: Yes  Problem Relation Age of Onset   Cervical cancer Mother     Social History:  reports that she has never smoked. She has never used smokeless tobacco. She reports that she does not drink alcohol and does not use drugs.   Physical Exam: BP 136/71   Pulse (!) 53   Ht 5\' 6"  (1.676 m)   Wt 254 lb (115.2 kg)   BMI 41.00 kg/m   Constitutional:  Alert and oriented, No acute distress. HEENT: [place] AT, moist mucus membranes.  Trachea midline, no masses. Cardiovascular: No clubbing, cyanosis, or edema. Respiratory: Normal respiratory effort, no increased work of breathing. Skin: No rashes, bruises or suspicious lesions. Neurologic: Grossly intact, no focal deficits, moving all 4 extremities. Psychiatric: Normal mood and affect.  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 Recurrent UTIs  - Has had great  improvement on topical estrogen cream.    I,Kailey Littlejohn,acting as a scribe for 05/30/2020, MD.,have documented all relevant documentation on the behalf of Vanna Scotland, MD,as directed by  Vanna Scotland, MD while in the presence of Vanna Scotland, MD.  I have reviewed the above documentation for accuracy and completeness, and I agree with the above.   Vanna Scotland, MD   [facility] [address] [place], [place] [phone]  I spent 35 total minutes on the day of the encounter including pre-visit review of the medical record, face-to-face time with the patient, and post visit ordering of labs/imaging/tests.

## 2020-12-31 ENCOUNTER — Other Ambulatory Visit: Payer: Self-pay

## 2020-12-31 ENCOUNTER — Ambulatory Visit: Payer: PPO | Admitting: Urology

## 2020-12-31 ENCOUNTER — Encounter: Payer: Self-pay | Admitting: Student

## 2020-12-31 ENCOUNTER — Ambulatory Visit: Payer: PPO | Admitting: Student

## 2020-12-31 VITALS — BP 138/66 | HR 59 | Temp 97.8°F | Ht 66.0 in | Wt 254.6 lb

## 2020-12-31 VITALS — BP 136/71 | HR 53 | Ht 66.0 in | Wt 254.0 lb

## 2020-12-31 DIAGNOSIS — N8111 Cystocele, midline: Secondary | ICD-10-CM | POA: Diagnosis not present

## 2020-12-31 DIAGNOSIS — N952 Postmenopausal atrophic vaginitis: Secondary | ICD-10-CM | POA: Diagnosis not present

## 2020-12-31 DIAGNOSIS — R300 Dysuria: Secondary | ICD-10-CM | POA: Diagnosis not present

## 2020-12-31 DIAGNOSIS — N390 Urinary tract infection, site not specified: Secondary | ICD-10-CM | POA: Diagnosis not present

## 2020-12-31 DIAGNOSIS — I10 Essential (primary) hypertension: Secondary | ICD-10-CM | POA: Diagnosis not present

## 2020-12-31 DIAGNOSIS — I480 Paroxysmal atrial fibrillation: Secondary | ICD-10-CM | POA: Diagnosis not present

## 2020-12-31 DIAGNOSIS — E782 Mixed hyperlipidemia: Secondary | ICD-10-CM

## 2020-12-31 NOTE — Patient Instructions (Signed)

## 2020-12-31 NOTE — Progress Notes (Signed)
Primary Physician:  Geoffry Paradise, MD  Patient ID: [name] [name], female    DOB: 04-Sep-1950, 70 y.o.   MRN: [id number]  Subjective:   Chief Complaint  Patient presents with   Atrial Fibrillation   Hypertension   Follow-up   HPI: [name] [name]  is a 70 y.o. female  with paroxysmal atypical atrial flutter, hyperlipidemia, morbid obesity and hypertension. She was found to be in atrial flutter with rapid ventricular response on 03/13/2015.  It was felt to be atypical atrial flutter and was evaluated by Dr. Sherryl Manges, recommended cardioversion, successful direct current cardioversion on 04/29/2015 with no recurrence.  02/14/2020 patient presented in atrial fibrillation with rapid ventricular response and underwent successful cardioversion 02/26/2020. She again underwent successful cardioversion from atrial fibrillation to sinus rhythm on 03/11/2020.   Previously unable to tolerate higher doses of amlodipine due to dizziness and unable to tolerate HCTZ due to worsening renal function.  Patient presents for 61-month follow-up.  She is presently doing well, her primary complaint is that she continues to struggle with back and left leg pain.  Reports home blood pressure readings averaging 123-130s/70s mmHg.  She has had no known recurrence of atrial fibrillation.  Denies chest pain, palpitations, dyspnea, syncope, near syncope, leg swelling, orthopnea, PND.  She continues to tolerate Xarelto without bleeding diathesis.  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   Exogenous obesity    Fibromyalgia    Hyperlipidemia    Hypothyroidism    Morbid obesity (HCC)    OSA (obstructive sleep apnea)    uses CPAP   Palpitations 07/13/2018   Paroxysmal atrial flutter (HCC)    Periodic limb movement disorder    REM sleep behavior disorder    Rhinitis    SOB (shortness of breath)    Spinal stenosis    Unspecified hypothyroidism     Family History  Adopted: Yes  Problem Relation Age of Onset   Cervical cancer Mother    Past Surgical History:  Procedure Laterality Date   CARDIOVERSION N/A 04/29/2015   Procedure: CARDIOVERSION;  Surgeon: Yates Decamp, MD;  Location: [facility] ENDOSCOPY;  Service: Cardiovascular;  Laterality: N/A;   CARDIOVERSION N/A 02/26/2020   Procedure: CARDIOVERSION;  Surgeon: Yates Decamp, MD;  Location: [facility] ENDOSCOPY;  Service: Cardiovascular;  Laterality: N/A;   CARDIOVERSION N/A 03/11/2020   Procedure: CARDIOVERSION;  Surgeon: Yates Decamp, MD;  Location: [facility] ENDOSCOPY;  Service: Cardiovascular;  Laterality: N/A;   CARPAL TUNNEL RELEASE     Bilateral   DILATION AND CURETTAGE OF UTERUS     left rotator cuff     resection uterine polyp  2011   Social History   Tobacco Use   Smoking status: Never   Smokeless tobacco: Never  Substance Use Topics   Alcohol use: No    Review of Systems  Constitutional: Negative for malaise/fatigue.  Cardiovascular:  Negative for chest pain, claudication, dyspnea on exertion, leg swelling, near-syncope, orthopnea, palpitations, paroxysmal nocturnal dyspnea and syncope.  Musculoskeletal:  Positive for arthritis (left hip) and back pain.  Objective:  Blood pressure 138/66, pulse (!) 59, temperature 97.8 F (36.6 C), height 5\' 6"  (1.676 m), weight 254 lb 9.6 oz (115.5 kg), SpO2 100 %. Body mass index is 41.09 kg/m.   Vitals with BMI 12/31/2020 11/20/2020 11/05/2020  Height 5\' 6"  5\' 6"  5\' 6"   Weight 254 lbs 10 oz 248 lbs 246 lbs  BMI 41.11 40.05 39.72  Systolic 749 449 675  Diastolic 66 83 79  Pulse 59 61 61    Orthostatic VS for the past 72 hrs (Last 3 readings):  Patient Position BP Location Cuff Size  12/31/20 1119 Sitting Left Arm Large    Physical Exam Vitals reviewed.  Constitutional:      Appearance: She is obese.  HENT:     Head: Normocephalic and atraumatic.  Neck:     Comments: Short neck and difficult to evaluate JVP Cardiovascular:     Rate and  Rhythm: Regular rhythm. Bradycardia present.     Pulses: Intact distal pulses.     Heart sounds: Normal heart sounds, S1 normal and S2 normal. No murmur heard.   No gallop.     Comments: No JVD. No leg edema Pulmonary:     Effort: Pulmonary effort is normal. No respiratory distress.     Breath sounds: Normal breath sounds. No wheezing, rhonchi or rales.  Abdominal:     Comments: Obese  Musculoskeletal:     Right lower leg: No edema.     Left lower leg: No edema.  Neurological:     Mental Status: She is alert.   Laboratory examination:   CMP Latest Ref Rng & Units 05/28/2020 03/11/2020 03/11/2020  Glucose 65 - 99 mg/dL 86 101(H) 111(H)  BUN 8 - 27 mg/dL 20 17 24(H)  Creatinine 0.57 - 1.00 mg/dL 1.16(H) 1.10(H) 1.10(H)  Sodium 134 - 144 mmol/L 139 143 137  Potassium 3.5 - 5.2 mmol/L 4.3 4.1 6.5(HH)  Chloride 96 - 106 mmol/L 100 106 106  CO2 20 - 29 mmol/L 26 - -  Calcium 8.7 - 10.3 mg/dL 9.0 - -  Total Protein 6.0 - 8.5 g/dL - - -  Total Bilirubin 0.0 - 1.2 mg/dL - - -  Alkaline Phos 39 - 117 IU/L - - -  AST 0 - 40 IU/L - - -  ALT 0 - 32 IU/L - - -   CBC Latest Ref Rng & Units 03/11/2020 03/11/2020 02/14/2020  WBC 3.4 - 10.8 x10E3/uL - - 10.2  Hemoglobin 12.0 - 15.0 g/dL 13.9 13.3 13.4  Hematocrit 36.0 - 46.0 % 41.0 39.0 41.2  Platelets 150 - 450 x10E3/uL - - 196   Lipid Panel     Component Value Date/Time   CHOL 129 11/14/2018 0830   TRIG 83 11/14/2018 0830   HDL 49 11/14/2018 0830   LDLCALC 63 11/14/2018 0830   HEMOGLOBIN A1C No results found for: HGBA1C, MPG TSH No results for input(s): TSH in the last 8760 hours.  External labs:  03/17/2020:  BUN 13, creatinine 1.0, EGFR 54.8, sodium 139, potassium 4.6 Hemoglobin 12.9, hematocrit 39.7, platelets 185 Total cholesterol 108, triglycerides 57, HDL 44, LDL 53  02/27/2019: HDL 46, LDL 63, triglycerides 76, total cholesterol 124 BUN 14, creatinine 0.90, TSH 0.84  11/21/2017: Cholesterol 133, triglycerides 88, HDL  47, LDL 88.  Creatinine 0.94, EGFR 63/72, potassium 4.7, CMP normal.  CBC normal.  Allergies   Allergies  Allergen Reactions   Aspirin Other (See Comments)   Azithromycin Other (See Comments)    unknown   Loratadine Other (See Comments)   Sulfa Antibiotics     Other reaction(s): Unknown   Codeine Nausea And Vomiting   Morphine Nausea And Vomiting    Medications Prior to Visit:   Outpatient Medications Prior to Visit  Medication Sig Dispense Refill   amLODipine (NORVASC) 5 MG tablet TAKE 1 TABLET (5 MG TOTAL) BY MOUTH DAILY. (Patient taking  differently: Take 2.5 mg by mouth daily. AM) 90 tablet 1   Ascorbic Acid (VITAMIN C) 500 MG CAPS Take 500 mg by mouth every evening.      Cholecalciferol (VITAMIN D-3) 125 MCG (5000 UT) TABS Take 5,000 mg by mouth daily.     Cranberry 1000 MG CAPS Take 1 capsule by mouth daily.     estradiol (ESTRACE) 0.1 MG/GM vaginal cream Discard applicator-apply one pea size amount to urethra Monday, Wed and Friday night 42.5 g 12   flecainide (TAMBOCOR) 100 MG tablet Take 1 tablet (100 mg total) by mouth 2 (two) times daily. 180 tablet 3   levothyroxine (SYNTHROID) 175 MCG tablet Take 175 mcg by mouth daily.     metoprolol tartrate (LOPRESSOR) 25 MG tablet Take 1 tablet (25 mg total) by mouth in the morning, at noon, and at bedtime. 90 tablet 1   rivaroxaban (XARELTO) 20 MG TABS tablet Take 1 tablet (20 mg total) by mouth every evening. 90 tablet 3   rosuvastatin (CRESTOR) 10 MG tablet TAKE ONE HALF TABLET BY MOUTH ONCE A DAY. 45 tablet 3   VITAMIN B COMPLEX-C PO Take 1 capsule by mouth daily.     acetaminophen (TYLENOL) 500 MG tablet Take 500-1,000 mg by mouth every 6 (six) hours as needed (for pain.).     No facility-administered medications prior to visit.   Final Medications at End of Visit    Current Meds  Medication Sig   amLODipine (NORVASC) 5 MG tablet TAKE 1 TABLET (5 MG TOTAL) BY MOUTH DAILY. (Patient taking differently: Take 2.5 mg by mouth  daily. AM)   Ascorbic Acid (VITAMIN C) 500 MG CAPS Take 500 mg by mouth every evening.    Cholecalciferol (VITAMIN D-3) 125 MCG (5000 UT) TABS Take 5,000 mg by mouth daily.   Cranberry 1000 MG CAPS Take 1 capsule by mouth daily.   estradiol (ESTRACE) 0.1 MG/GM vaginal cream Discard applicator-apply one pea size amount to urethra Monday, Wed and Friday night   flecainide (TAMBOCOR) 100 MG tablet Take 1 tablet (100 mg total) by mouth 2 (two) times daily.   levothyroxine (SYNTHROID) 175 MCG tablet Take 175 mcg by mouth daily.   metoprolol tartrate (LOPRESSOR) 25 MG tablet Take 1 tablet (25 mg total) by mouth in the morning, at noon, and at bedtime.   rivaroxaban (XARELTO) 20 MG TABS tablet Take 1 tablet (20 mg total) by mouth every evening.   rosuvastatin (CRESTOR) 10 MG tablet TAKE ONE HALF TABLET BY MOUTH ONCE A DAY.   VITAMIN B COMPLEX-C PO Take 1 capsule by mouth daily.   Radiology:   No results found.  Cardiac Studies:   PCV MYOCARDIAL PERFUSION WO LEXISCAN 04/07/2020 Lexiscan/modified Bruce nuclear stress test performed using 1-day protocol. Patient reached 5 mets and 80% MPHR. Stress EKG at 80% MPHR showed sinus tachcyardia, no ischemic changes. Normal myocardial perfusion. Stress LVEF 64%. Low risk study.  Direct-current cardioversion 03/11/2020: Converted to normal sinus rhythm with 120 J of electricity x1.  Direct-current cardioversion 02/26/2020:  A flutter to A. fib to normal sinus rhythm with 75x 1 then 150 Joules of electricity X 1  PCV ECHOCARDIOGRAM COMPLETE 76/54/6503 Normal LV systolic function with EF 58%. Left ventricle cavity is normal in size. Normal global wall motion. Indeterminate diastolic filling pattern. Calculated EF 58%. Left atrial cavity is mildly dilated. Structurally normal mitral valve.  Mild (Grade I) mitral regurgitation. Structurally normal tricuspid valve with trace regurgitation. No evidence of pulmonary hypertension. Patient appeared to  be in  atrial flutter during examination. Compared to 03/19/2015, rhythm abnormal now.  Sleep Study 2006: Used CPAP. Repeat sleep study. 05/28/2015 by Dr. Caryl Comes: No e/o sleep apnea.   Chest x-ray 04/30/2015: Mild CHF, atherosclerotic calcification the aortic arch.   Direct current cardioversion 04/29/2015: A. Flutter to NSR with 75J x 1.   Treadmill Exercise stress 10/11/2014: Indication: Palpitations, Chest pain The patient exercised according to Bruce Protocol, Total time recorded 04:58min achieving max heart rate of 141 which was 90% of MPHR for age and 5.80 METS of work. Normal BP response. Resting ECG NSR, low voltage complexes. There was no ST-T changes of ischemia with exercise stress test. Stress terminated due to THR (>85% MPHR)/MPHR met and fatigue. Decreased exercise tolerence.   EKG:  EKG 12/31/2020: Sinus bradycardia with first-degree AV block at a rate of 50 bpm.  Normal axis.  Incomplete right bundle branch block.  Poor R wave progression, cannot exclude anteroseptal infarct old.  No evidence of ischemia or underlying injury pattern.  Compared to EKG 05/21/2020, no significant change.  EKG 03/03/2020: Atrial fibrillation with controlled ventricular response at a rate of 94 bpm.  Normal axis.  Incomplete right bundle branch block.  Poor R wave progression cannot exclude anteroseptal infarct old.  Compared to EKG 02/14/2020, ventricular rate has improved.  EKG 07/27/2019: Normal sinus rhythm with rate of 64 bpm, left abnormality, normal axis.  No evidence of ischemia, normal EKG.   No significant change from EKG 01/23/2019.   Assessment:     ICD-10-CM   1. Paroxysmal atrial fibrillation (HCC)  I48.0 EKG 12-Lead    2. Essential hypertension  I10     3. Mixed hyperlipidemia  E78.2      No orders of the defined types were placed in this encounter.  There are no discontinued medications.  This patients CHA2DS2-VASc Score 3 (HTN, Age, F) and yearly risk of stroke 3.2%.    Recommendations:    [name] [name]  is a 70 y.o.  with paroxysmal atypical atrial flutter, hyperlipidemia, morbid obesity and hypertension. She was found to be in atrial flutter with rapid ventricular response on 03/13/2015.  It was felt to be atypical atrial flutter and was evaluated by Dr. [name], recommended cardioversion, successful direct current cardioversion on 04/29/2015 with no recurrence.  02/14/2020 patient presented in atrial fibrillation with rapid ventricular response and underwent successful cardioversion 02/26/2020.  She again underwent successful cardioversion from atrial fibrillation to sinus rhythm on 03/11/2020.   Patient presents for 16-month follow-up.  She is presently doing well without specific cardiovascular complaints.  No known recurrence of atrial fibrillation she is tolerating Xarelto without bleeding diathesis.  No clinical evidence of heart failure at this time.  Patient's blood pressure is mildly elevated in the office, however on home monitoring it is well controlled.  Will not make changes to medications at this time.  Lipids are well controlled.  Counseled patient regarding diet and lifestyle modifications particularly regarding weight loss.  Follow-up in 6 months, sooner if needed, for hypertension, hyperlipidemia, atrial fibrillation.   Alethia Berthold, PA-C 12/31/2020, 12:44 PM Office: [phone]

## 2021-01-01 ENCOUNTER — Ambulatory Visit: Payer: PPO | Admitting: Student

## 2021-01-02 ENCOUNTER — Ambulatory Visit: Payer: PPO | Admitting: Physician Assistant

## 2021-01-02 LAB — URINALYSIS, COMPLETE
Bilirubin, UA: NEGATIVE
Glucose, UA: NEGATIVE
Ketones, UA: NEGATIVE
Nitrite, UA: NEGATIVE
Protein,UA: NEGATIVE
RBC, UA: NEGATIVE
Specific Gravity, UA: 1.01 (ref 1.005–1.030)
Urobilinogen, Ur: 0.2 mg/dL (ref 0.2–1.0)
pH, UA: 5.5 (ref 5.0–7.5)

## 2021-01-02 LAB — MICROSCOPIC EXAMINATION: Bacteria, UA: NONE SEEN

## 2021-01-20 ENCOUNTER — Encounter: Payer: Self-pay | Admitting: Physician Assistant

## 2021-01-20 ENCOUNTER — Other Ambulatory Visit: Payer: Self-pay

## 2021-01-20 ENCOUNTER — Ambulatory Visit: Payer: PPO | Admitting: Physician Assistant

## 2021-01-20 VITALS — BP 159/83 | HR 55 | Ht 66.0 in | Wt 252.0 lb

## 2021-01-20 DIAGNOSIS — R300 Dysuria: Secondary | ICD-10-CM

## 2021-01-20 MED ORDER — NITROFURANTOIN MONOHYD MACRO 100 MG PO CAPS: 100.0000 mg | ORAL_CAPSULE | Freq: Two times a day (BID) | ORAL | 0 refills | Status: AC

## 2021-01-20 NOTE — Progress Notes (Signed)
01/20/2021 4:10 PM   [name] [birth year] [id number]  CC: Chief Complaint  Patient presents with   Dysuria   HPI: [name] [name] is a 70 y.o. female with PMH recurrent UTI, atrophic vaginitis on topical vaginal estrogen cream, and stage II/III cystocele who presents today for evaluation of possible UTI.   Today she reports a 3-day history of dysuria and urinary frequency.  She denies fever, chills, nausea, vomiting, flank pain, and gross hematuria.  She has not taken any medication to help with her symptoms.  She has been diligent about using her estrogen cream 3 times weekly.  In-office catheterized UA today positive for trace intact blood and 1+ leukocyte esterase; urine microscopy with >30 WBCs/HPF and moderate bacteria.   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   Exogenous obesity    Fibromyalgia    Hyperlipidemia    Hypothyroidism    Morbid obesity (HCC)    OSA (obstructive sleep apnea)    uses CPAP   Palpitations 07/13/2018   Paroxysmal atrial flutter (HCC)    Periodic limb movement disorder    REM sleep behavior disorder    Rhinitis    SOB (shortness of breath)    Spinal stenosis    Unspecified hypothyroidism     Surgical History: Past Surgical History:  Procedure Laterality Date   CARDIOVERSION N/A 04/29/2015   Procedure: CARDIOVERSION;  Surgeon: Yates Decamp, MD;  Location: [facility] ENDOSCOPY;  Service: Cardiovascular;  Laterality: N/A;   CARDIOVERSION N/A 02/26/2020   Procedure: CARDIOVERSION;  Surgeon: Yates Decamp, MD;  Location: [facility] ENDOSCOPY;  Service: Cardiovascular;  Laterality: N/A;   CARDIOVERSION N/A 03/11/2020   Procedure: CARDIOVERSION;  Surgeon: Yates Decamp, MD;  Location: [facility] ENDOSCOPY;  Service: Cardiovascular;  Laterality: N/A;   CARPAL TUNNEL RELEASE     Bilateral   DILATION AND CURETTAGE OF UTERUS     left rotator cuff     resection uterine polyp  2011    Home  Medications:  Allergies as of 01/20/2021       Reactions   Aspirin Other (See Comments)   Azithromycin Other (See Comments)   unknown   Loratadine Other (See Comments)   Sulfa Antibiotics    Other reaction(s): Unknown   Codeine Nausea And Vomiting   Morphine Nausea And Vomiting        Medication List        Accurate as of January 20, 2021  4:10 PM. If you have any questions, ask your nurse or doctor.          acetaminophen 500 MG tablet Commonly known as: TYLENOL Take 500-1,000 mg by mouth every 6 (six) hours as needed (for pain.).   amLODipine 5 MG tablet Commonly known as: NORVASC TAKE 1 TABLET (5 MG TOTAL) BY MOUTH DAILY. What changed:  how much to take additional instructions   Cranberry 1000 MG Caps Take 1 capsule by mouth daily.   estradiol 0.1 MG/GM vaginal cream Commonly known as: ESTRACE Discard applicator-apply one pea size amount to urethra Monday, Wed and Friday night   flecainide 100 MG tablet Commonly known as: TAMBOCOR Take 1 tablet (100 mg total) by mouth 2 (two) times daily.   levothyroxine 175 MCG tablet Commonly known as: SYNTHROID Take 175 mcg by mouth daily.   metoprolol tartrate 25 MG tablet Commonly known as: LOPRESSOR Take 1 tablet (25 mg total) by mouth in the morning, at noon, and  at bedtime.   rivaroxaban 20 MG Tabs tablet Commonly known as: Xarelto Take 1 tablet (20 mg total) by mouth every evening.   rosuvastatin 10 MG tablet Commonly known as: CRESTOR TAKE ONE HALF TABLET BY MOUTH ONCE A DAY.   VITAMIN B COMPLEX-C PO Take 1 capsule by mouth daily.   Vitamin C 500 MG Caps Take 500 mg by mouth every evening.   Vitamin D-3 125 MCG (5000 UT) Tabs Take 5,000 mg by mouth daily.        Allergies:  Allergies  Allergen Reactions   Aspirin Other (See Comments)   Azithromycin Other (See Comments)    unknown   Loratadine Other (See Comments)   Sulfa Antibiotics     Other reaction(s): Unknown   Codeine Nausea And  Vomiting   Morphine Nausea And Vomiting    Family History: Family History  Adopted: Yes  Problem Relation Age of Onset   Cervical cancer Mother     Social History:   reports that she has never smoked. She has never used smokeless tobacco. She reports that she does not drink alcohol and does not use drugs.  Physical Exam: BP (!) 159/83   Pulse (!) 55   Ht 5\' 6"  (1.676 m)   Wt 252 lb (114.3 kg)   BMI 40.67 kg/m   Constitutional:  Alert and oriented, no acute distress, nontoxic appearing HEENT: [place], AT Cardiovascular: No clubbing, cyanosis, or edema Respiratory: Normal respiratory effort, no increased work of breathing Skin: No rashes, bruises or suspicious lesions Neurologic: Grossly intact, no focal deficits, moving all 4 extremities Psychiatric: Normal mood and affect  Laboratory Data: Results for orders placed or performed in visit on 01/20/21  Microscopic Examination   Urine  Result Value Ref Range   WBC, UA >30 (A) 0 - 5 /hpf   RBC 0-2 0 - 2 /hpf   Epithelial Cells (non renal) 0-10 0 - 10 /hpf   Bacteria, UA Moderate (A) None seen/Few  Urinalysis, Complete  Result Value Ref Range   Specific Gravity, UA 1.015 1.005 - 1.030   pH, UA 7.0 5.0 - 7.5   Color, UA Yellow Yellow   Appearance Ur Cloudy (A) Clear   Leukocytes,UA 1+ (A) Negative   Protein,UA Negative Negative/Trace   Glucose, UA Negative Negative   Ketones, UA Negative Negative   RBC, UA Trace (A) Negative   Bilirubin, UA Negative Negative   Urobilinogen, Ur 1.0 0.2 - 1.0 mg/dL   Nitrite, UA Negative Negative   Microscopic Examination See below:    Assessment & Plan:   1. Dysuria Acute onset of irritative symptoms and UA notable for pyuria and bacteriuria today.  Will start empiric Macrobid and send for culture for further evaluation.  Encourage patient to continue topical vaginal estrogen cream. - Urinalysis, Complete - CULTURE, URINE COMPREHENSIVE - nitrofurantoin, macrocrystal-monohydrate,  (MACROBID) 100 MG capsule; Take 1 capsule (100 mg total) by mouth 2 (two) times daily for 5 days.  Dispense: 10 capsule; Refill: 0   Return if symptoms worsen or fail to improve.  01/22/21, PA-C  [facility] [address] [place], [place] [phone]

## 2021-01-21 LAB — URINALYSIS, COMPLETE
Bilirubin, UA: NEGATIVE
Glucose, UA: NEGATIVE
Ketones, UA: NEGATIVE
Nitrite, UA: NEGATIVE
Protein,UA: NEGATIVE
Specific Gravity, UA: 1.015 (ref 1.005–1.030)
Urobilinogen, Ur: 1 mg/dL (ref 0.2–1.0)
pH, UA: 7 (ref 5.0–7.5)

## 2021-01-21 LAB — MICROSCOPIC EXAMINATION: WBC, UA: >30 /hpf — AB (ref 0–5)

## 2021-01-23 ENCOUNTER — Other Ambulatory Visit: Payer: Self-pay | Admitting: Cardiology

## 2021-01-24 LAB — CULTURE, URINE COMPREHENSIVE

## 2021-02-03 ENCOUNTER — Other Ambulatory Visit: Payer: Self-pay

## 2021-02-03 ENCOUNTER — Ambulatory Visit: Payer: PPO | Admitting: Physician Assistant

## 2021-02-03 ENCOUNTER — Encounter: Payer: Self-pay | Admitting: Physician Assistant

## 2021-02-03 VITALS — BP 122/74 | HR 58 | Ht 66.0 in | Wt 250.0 lb

## 2021-02-03 DIAGNOSIS — R300 Dysuria: Secondary | ICD-10-CM

## 2021-02-03 MED ORDER — CEFUROXIME AXETIL 250 MG PO TABS: 250.0000 mg | ORAL_TABLET | Freq: Two times a day (BID) | ORAL | 0 refills | Status: AC

## 2021-02-03 NOTE — Patient Instructions (Signed)
Continue topical vaginal estrogen cream, cranberry supplements, and lactobacillus-containing probiotics. You may also consider starting a d-mannose supplement to augment your UTI prevention regimen.

## 2021-02-04 LAB — URINALYSIS, COMPLETE
Bilirubin, UA: NEGATIVE
Ketones, UA: NEGATIVE
Nitrite, UA: POSITIVE — AB
Protein,UA: NEGATIVE
Specific Gravity, UA: 1.01 (ref 1.005–1.030)
Urobilinogen, Ur: 0.2 mg/dL (ref 0.2–1.0)
pH, UA: 6 (ref 5.0–7.5)

## 2021-02-04 LAB — MICROSCOPIC EXAMINATION: WBC, UA: >30 /hpf — AB (ref 0–5)

## 2021-02-05 ENCOUNTER — Telehealth: Payer: Self-pay

## 2021-02-05 NOTE — Telephone Encounter (Signed)
Patient called stating she is having urgency with decreased urine output. I advised pt to continue the medication and we will call with culture. As per sam have pt drink a lot of fluid and can get AZO. Pt verbalized understanding.

## 2021-02-09 LAB — CULTURE, URINE COMPREHENSIVE

## 2021-02-09 NOTE — Progress Notes (Signed)
02/03/2021 4:54 PM   [name] Feb 05, 1951 [id number]  CC: Chief Complaint  Patient presents with   Dysuria   HPI: [name] [name] is a 70 y.o. female with PMH recurrent UTI, atrophic vaginitis on topical vaginal estrogen cream, and stage II/III cystocele who presents today for evaluation of possible UTI.  I saw her in clinic most recently on 01/20/2021 for the same with reports of 3 days of dysuria and frequency.  UA was notable for pyuria and bacteriuria at that time and I started her on empiric Macrobid.  Her culture subsequently finalized with pansensitive E. coli.  Today she reports her symptoms never fully resolved on Macrobid, however they acutely worsened 4 days ago.  She took Azo yesterday to help with her symptoms, which was helpful.  In-office UA today positive for trace glucose, trace intact blood, nitrites, and 2+ leukocyte esterase; urine microscopy with >30 WBCs/HPF and many bacteria.   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   Exogenous obesity    Fibromyalgia    Hyperlipidemia    Hypothyroidism    Morbid obesity (HCC)    OSA (obstructive sleep apnea)    uses CPAP   Palpitations 07/13/2018   Paroxysmal atrial flutter (HCC)    Periodic limb movement disorder    REM sleep behavior disorder    Rhinitis    SOB (shortness of breath)    Spinal stenosis    Unspecified hypothyroidism     Surgical History: Past Surgical History:  Procedure Laterality Date   CARDIOVERSION N/A 04/29/2015   Procedure: CARDIOVERSION;  Surgeon: Yates Decamp, MD;  Location: [facility] ENDOSCOPY;  Service: Cardiovascular;  Laterality: N/A;   CARDIOVERSION N/A 02/26/2020   Procedure: CARDIOVERSION;  Surgeon: Yates Decamp, MD;  Location: [facility] ENDOSCOPY;  Service: Cardiovascular;  Laterality: N/A;   CARDIOVERSION N/A 03/11/2020   Procedure: CARDIOVERSION;  Surgeon: Yates Decamp, MD;  Location: [facility] ENDOSCOPY;  Service:  Cardiovascular;  Laterality: N/A;   CARPAL TUNNEL RELEASE     Bilateral   DILATION AND CURETTAGE OF UTERUS     left rotator cuff     resection uterine polyp  2011    Home Medications:  Allergies as of 02/03/2021       Reactions   Aspirin Other (See Comments)   Azithromycin Other (See Comments)   unknown   Loratadine Other (See Comments)   Sulfa Antibiotics    Other reaction(s): Unknown   Codeine Nausea And Vomiting   Morphine Nausea And Vomiting        Medication List        Accurate as of February 03, 2021 11:59 PM. If you have any questions, ask your nurse or doctor.          acetaminophen 500 MG tablet Commonly known as: TYLENOL Take 500-1,000 mg by mouth every 6 (six) hours as needed (for pain.).   amLODipine 5 MG tablet Commonly known as: NORVASC TAKE 1 TABLET (5 MG TOTAL) BY MOUTH DAILY. What changed:  how much to take additional instructions   cefUROXime 250 MG tablet Commonly known as: CEFTIN Take 1 tablet (250 mg total) by mouth 2 (two) times daily with a meal for 7 days. Started by: Carman Ching, PA-C   Cranberry 1000 MG Caps Take 1 capsule by mouth daily.   estradiol 0.1 MG/GM vaginal cream Commonly known as: ESTRACE Discard applicator-apply one pea size amount to urethra Monday, Wed and Friday  night   flecainide 100 MG tablet Commonly known as: TAMBOCOR Take 1 tablet (100 mg total) by mouth 2 (two) times daily.   levothyroxine 175 MCG tablet Commonly known as: SYNTHROID Take 175 mcg by mouth daily.   metoprolol tartrate 25 MG tablet Commonly known as: LOPRESSOR TAKE 1 TABLET BY MOUTH THREE TIMES A DAY   rivaroxaban 20 MG Tabs tablet Commonly known as: Xarelto Take 1 tablet (20 mg total) by mouth every evening.   rosuvastatin 10 MG tablet Commonly known as: CRESTOR TAKE ONE HALF TABLET BY MOUTH ONCE A DAY.   VITAMIN B COMPLEX-C PO Take 1 capsule by mouth daily.   Vitamin C 500 MG Caps Take 500 mg by mouth every  evening.   Vitamin D-3 125 MCG (5000 UT) Tabs Take 5,000 mg by mouth daily.        Allergies:  Allergies  Allergen Reactions   Aspirin Other (See Comments)   Azithromycin Other (See Comments)    unknown   Loratadine Other (See Comments)   Sulfa Antibiotics     Other reaction(s): Unknown   Codeine Nausea And Vomiting   Morphine Nausea And Vomiting    Family History: Family History  Adopted: Yes  Problem Relation Age of Onset   Cervical cancer Mother     Social History:   reports that she has never smoked. She has never used smokeless tobacco. She reports that she does not drink alcohol and does not use drugs.  Physical Exam: BP 122/74   Pulse (!) 58   Ht 5\' 6"  (1.676 m)   Wt 250 lb (113.4 kg)   BMI 40.35 kg/m   Constitutional:  Alert and oriented, no acute distress, nontoxic appearing HEENT: [place], AT Cardiovascular: No clubbing, cyanosis, or edema Respiratory: Normal respiratory effort, no increased work of breathing Skin: No rashes, bruises or suspicious lesions Neurologic: Grossly intact, no focal deficits, moving all 4 extremities Psychiatric: Normal mood and affect  Laboratory Data: Results for orders placed or performed in visit on 02/03/21  CULTURE, URINE COMPREHENSIVE   Specimen: Urine   UR  Result Value Ref Range   Urine Culture, Comprehensive Final report (A)    Organism ID, Bacteria Escherichia coli (A)    ANTIMICROBIAL SUSCEPTIBILITY Comment   Microscopic Examination   Urine  Result Value Ref Range   WBC, UA >30 (A) 0 - 5 /hpf   RBC 0-2 0 - 2 /hpf   Epithelial Cells (non renal) 0-10 0 - 10 /hpf   Bacteria, UA Many (A) None seen/Few  Urinalysis, Complete  Result Value Ref Range   Specific Gravity, UA 1.010 1.005 - 1.030   pH, UA 6.0 5.0 - 7.5   Color, UA Yellow Yellow   Appearance Ur Cloudy (A) Clear   Leukocytes,UA 2+ (A) Negative   Protein,UA Negative Negative/Trace   Glucose, UA Trace (A) Negative   Ketones, UA Negative Negative    RBC, UA Trace (A) Negative   Bilirubin, UA Negative Negative   Urobilinogen, Ur 0.2 0.2 - 1.0 mg/dL   Nitrite, UA Positive (A) Negative   Microscopic Examination See below:    Assessment & Plan:   1. Dysuria UA today remains notable for pyuria and bacteriuria consistent with recurrent versus persistent UTI.  We will treat her with cefuroxime x7 days and repeat urine culture today.  Patient is in agreement with this plan - Urinalysis, Complete - CULTURE, URINE COMPREHENSIVE - cefUROXime (CEFTIN) 250 MG tablet; Take 1 tablet (250 mg total) by mouth  2 (two) times daily with a meal for 7 days.  Dispense: 14 tablet; Refill: 0  Return if symptoms worsen or fail to improve.  Carman Ching, PA-C  [facility] [address] [place], [place] [address] [phone]

## 2021-02-10 DIAGNOSIS — M25562 Pain in left knee: Secondary | ICD-10-CM | POA: Diagnosis not present

## 2021-02-10 DIAGNOSIS — M25511 Pain in right shoulder: Secondary | ICD-10-CM | POA: Diagnosis not present

## 2021-02-11 ENCOUNTER — Telehealth: Payer: Self-pay

## 2021-02-11 NOTE — Telephone Encounter (Signed)
Patient reports she finished her ABX yesterday and is feeling better. No symptoms at this time.

## 2021-02-11 NOTE — Telephone Encounter (Signed)
-----   Message from Carman Ching, [place] sent at 02/10/2021  4:46 PM EDT ----- She received culture appropriate abx per culture results. Is she feeling better? ----- Message ----- From: Interface, [facility] Lab Results In Sent: 02/04/2021   5:37 AM EDT To: Carman Ching, PA-C

## 2021-03-03 ENCOUNTER — Other Ambulatory Visit: Payer: Self-pay

## 2021-03-03 ENCOUNTER — Ambulatory Visit: Payer: PPO | Admitting: Orthopaedic Surgery

## 2021-03-03 ENCOUNTER — Ambulatory Visit: Payer: Self-pay

## 2021-03-03 DIAGNOSIS — M25562 Pain in left knee: Secondary | ICD-10-CM | POA: Diagnosis not present

## 2021-03-03 DIAGNOSIS — G8929 Other chronic pain: Secondary | ICD-10-CM | POA: Diagnosis not present

## 2021-03-03 MED ORDER — METHYLPREDNISOLONE ACETATE 40 MG/ML IJ SUSP
40.0000 mg | INTRAMUSCULAR | Status: AC | PRN
  Administered 2021-03-03: 40 mg via INTRA_ARTICULAR

## 2021-03-03 MED ORDER — LIDOCAINE HCL 1 % IJ SOLN
3.0000 mL | INTRAMUSCULAR | Status: AC | PRN
  Administered 2021-03-03: 3 mL

## 2021-03-03 NOTE — Progress Notes (Signed)
Office Visit Note   Patient: [name] [name]           Date of Birth: 05-09-1950           MRN: [id number] Visit Date: 03/03/2021              Requested by: Geoffry Paradise, MD [address] [place],  [place] [address] PCP: Geoffry Paradise, MD   Assessment & Plan: Visit Diagnoses:  1. Chronic pain of left knee     Plan: I talked her about being conservative at first since she has not had any type of steroid injection and she cannot take anti-inflammatories.  She agrees with this as well and I did place a steroid injection in her left knee without difficulty.  I would like to see her back in 2 weeks to see if she has had good results of the steroid injection.  My next step would be considering an MRI of the left knee to rule out an internal derangement such as a meniscal tear given the posterior pain she is having.  We will wait to see what response she has had from the steroid injection first.  All questions and concerns were answered and addressed.  Follow-Up Instructions: Return in about 2 weeks (around 03/17/2021).   Orders:  Orders Placed This Encounter  Procedures   Large Joint Inj   XR Knee 1-2 Views Left   No orders of the defined types were placed in this encounter.     Procedures: Large Joint Inj: L knee on 03/03/2021 10:41 AM Indications: diagnostic evaluation and pain Details: 22 G 1.5 in needle, superolateral approach  Arthrogram: No  Medications: 3 mL lidocaine 1 %; 40 mg methylPREDNISolone acetate 40 MG/ML Outcome: tolerated well, no immediate complications Procedure, treatment alternatives, risks and benefits explained, specific risks discussed. Consent was given by the patient. Immediately prior to procedure a time out was called to verify the correct patient, procedure, equipment, support staff and site/side marked as required. Patient was prepped and draped in the usual sterile fashion.      Clinical Data: No additional findings.   Subjective: Chief  Complaint  Patient presents with   Left Knee - Pain  The patient is sometimes seen for the first time.  She has been dealing with left knee pain and has questioned whether or not there is a cyst in the back of her knee.  She is in need potentially of lumbar spine surgery and needed to have her knee checked out.  It does wake her up at night in terms of pain in the back of her left knee.  She is never had surgery on that left knee nor has she injured it.  She is on Xarelto so she cannot take anti-inflammatories.  She denies any locking catching but does have pain with weightbearing with the left knee.  She does appear obese but she does not have a weight or BMI calculation on the system today.  HPI  Review of Systems There is currently listed no headache, chest pain, shortness of breath, fever, chills, nausea, vomiting  Objective: Vital Signs: There were no vitals taken for this visit.  Physical Exam She is alert and orient x3 and in no acute distress Ortho Exam Examination of her left knee shows no effusion.  There is posterior lateral pain in the back of the knee and a palpable Baker's cyst.  There is also pain of the proximal gastroc muscles in the back of the knee.  Her extensor mechanism is intact the knee has full range of motion.  She does have a large soft tissue envelope with her thigh and around her knee. Specialty Comments:  No specialty comments available.  Imaging: XR Knee 1-2 Views Left  Result Date: 03/03/2021 2 views of the left knee show no acute findings.  There is slight medial joint line/medial joint compartment narrowing and patellofemoral narrowing.  There is no effusion.    PMFS History: Patient Active Problem List   Diagnosis Date Noted   Persistent atrial fibrillation (HCC)    Atypical atrial flutter (HCC) 02/25/2020   Palpitations 07/13/2018   Laboratory examination 07/13/2018   Aortic arch atherosclerosis (HCC) 07/13/2018   Sleep talking 06/04/2015   SOB  (shortness of breath)    Morbid obesity (HCC)    Fibromyalgia    Hyperlipidemia    Hypothyroidism    Paroxysmal atrial flutter (HCC)    ANXIETY DEPRESSION 04/15/2007   REM SLEEP BEHAVIOR DISORDER 04/15/2007   UNSPECIFIED HYPOTHYROIDISM 04/14/2007   EXOGENOUS OBESITY 02/10/2007   OBSTRUCTIVE SLEEP APNEA 02/10/2007   PERIODIC LIMB MOVEMENT DISORDER 02/10/2007   RHINITIS 02/10/2007   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   Exogenous obesity    Fibromyalgia    Hyperlipidemia    Hypothyroidism    Morbid obesity (HCC)    OSA (obstructive sleep apnea)    uses CPAP   Palpitations 07/13/2018   Paroxysmal atrial flutter (HCC)    Periodic limb movement disorder    REM sleep behavior disorder    Rhinitis    SOB (shortness of breath)    Spinal stenosis    Unspecified hypothyroidism     Family History  Adopted: Yes  Problem Relation Age of Onset   Cervical cancer Mother     Past Surgical History:  Procedure Laterality Date   CARDIOVERSION N/A 04/29/2015   Procedure: CARDIOVERSION;  Surgeon: Yates Decamp, MD;  Location: [facility] ENDOSCOPY;  Service: Cardiovascular;  Laterality: N/A;   CARDIOVERSION N/A 02/26/2020   Procedure: CARDIOVERSION;  Surgeon: Yates Decamp, MD;  Location: [facility] ENDOSCOPY;  Service: Cardiovascular;  Laterality: N/A;   CARDIOVERSION N/A 03/11/2020   Procedure: CARDIOVERSION;  Surgeon: Yates Decamp, MD;  Location: [facility] ENDOSCOPY;  Service: Cardiovascular;  Laterality: N/A;   CARPAL TUNNEL RELEASE     Bilateral   DILATION AND CURETTAGE OF UTERUS     left rotator cuff     resection uterine polyp  2011   Social History   Occupational History   Not on file  Tobacco Use   Smoking status: Never   Smokeless tobacco: Never  Vaping Use   Vaping Use: Never used  Substance and Sexual Activity   Alcohol use: No   Drug use: No   Sexual activity: Not on file

## 2021-03-04 ENCOUNTER — Other Ambulatory Visit: Payer: Self-pay | Admitting: Student

## 2021-03-17 ENCOUNTER — Ambulatory Visit: Payer: PPO | Admitting: Orthopaedic Surgery

## 2021-03-17 ENCOUNTER — Encounter: Payer: Self-pay | Admitting: Orthopaedic Surgery

## 2021-03-17 DIAGNOSIS — G8929 Other chronic pain: Secondary | ICD-10-CM | POA: Diagnosis not present

## 2021-03-17 DIAGNOSIS — M25562 Pain in left knee: Secondary | ICD-10-CM

## 2021-03-17 NOTE — Progress Notes (Signed)
The patient is 2 weeks into a steroid injection in her left knee due to chronic pain.  She is an active 70 year old female.  She says she is doing great and the injection helped her quite a bit.  She said the pain is almost completely gone.  Her x-rays previously showed only some mild arthritic changes in her knee.  This had been pain going on for some time.  On exam today there is no pain on my exam with putting her knee through flexion extension.  Her knee is ligamentously stable.  This point follow-up can be as needed since she is doing well.  If she does have recurrence in pain we will see her back.  She knows to wait least 3 to 4 months between steroid injections.  Hers would be a knee I would obtain an MRI if she continues to have problems.

## 2021-03-20 ENCOUNTER — Telehealth: Payer: Self-pay

## 2021-03-20 ENCOUNTER — Ambulatory Visit: Payer: PPO | Admitting: Physician Assistant

## 2021-03-20 ENCOUNTER — Other Ambulatory Visit: Payer: Self-pay

## 2021-03-20 VITALS — BP 142/65 | HR 57 | Wt 250.0 lb

## 2021-03-20 DIAGNOSIS — R300 Dysuria: Secondary | ICD-10-CM | POA: Diagnosis not present

## 2021-03-20 DIAGNOSIS — N390 Urinary tract infection, site not specified: Secondary | ICD-10-CM

## 2021-03-20 LAB — MICROSCOPIC EXAMINATION
RBC, Urine: NONE SEEN /hpf (ref 0–2)
WBC, UA: >30 /hpf — AB (ref 0–5)

## 2021-03-20 LAB — URINALYSIS, COMPLETE
Bilirubin, UA: NEGATIVE
Glucose, UA: NEGATIVE
Nitrite, UA: POSITIVE — AB
Protein,UA: NEGATIVE
Specific Gravity, UA: 1.025 (ref 1.005–1.030)
Urobilinogen, Ur: 2 mg/dL — ABNORMAL HIGH (ref 0.2–1.0)
pH, UA: 5.5 (ref 5.0–7.5)

## 2021-03-20 MED ORDER — CEFUROXIME AXETIL 250 MG PO TABS: 250.0000 mg | ORAL_TABLET | Freq: Two times a day (BID) | ORAL | 0 refills | Status: AC

## 2021-03-20 NOTE — Progress Notes (Signed)
03/20/2021 3:17 PM   [name] [name] March 10, 1951 [id number]  CC: Chief Complaint  Patient presents with   Dysuria   HPI: [name] [name] is a 70 y.o. female with PMH recurrent UTI on topical vaginal estrogen cream, cranberry supplements, and daily probiotics; atrophic vaginitis; and stage II/III cystocele who presents today for evaluation of possible UTI.   Today she reports a 2-day history of dysuria and frequency.  She took Azo 2 days ago, which largely improved her symptoms.  She denies fever, chills, nausea, vomiting, and gross hematuria.  Overall, she is frustrated with her frequency of infection.  In-office catheterized UA today positive for trace ketones, trace intact blood, 2.0 EU/DL urobilinogen, nitrites, and 2+ leukocyte esterase; urine microscopy with >30 WBCs/HPF and many bacteria.   PMH: Past Medical History:  Diagnosis Date   Anxiety and depression    Aortic arch atherosclerosis ([place]) 07/13/2018   Exogenous obesity    Fibromyalgia    Hyperlipidemia    Hypothyroidism    Morbid obesity (HCC)    OSA (obstructive sleep apnea)    uses CPAP   Palpitations 07/13/2018   Paroxysmal atrial flutter (HCC)    Periodic limb movement disorder    REM sleep behavior disorder    Rhinitis    SOB (shortness of breath)    Spinal stenosis    Unspecified hypothyroidism     Surgical History: Past Surgical History:  Procedure Laterality Date   CARDIOVERSION N/A 04/29/2015   Procedure: CARDIOVERSION;  Surgeon: Adrian Prows, MD;  Location: [place];  Service: Cardiovascular;  Laterality: N/A;   CARDIOVERSION N/A 02/26/2020   Procedure: CARDIOVERSION;  Surgeon: Adrian Prows, MD;  Location: [place];  Service: Cardiovascular;  Laterality: N/A;   CARDIOVERSION N/A 03/11/2020   Procedure: CARDIOVERSION;  Surgeon: Adrian Prows, MD;  Location: [place];  Service: Cardiovascular;  Laterality: N/A;   CARPAL TUNNEL RELEASE     Bilateral   DILATION AND CURETTAGE OF UTERUS     left  rotator cuff     resection uterine polyp  2011    Home Medications:  Allergies as of 03/20/2021       Reactions   Aspirin Other (See Comments)   Azithromycin Other (See Comments)   unknown   Loratadine Other (See Comments)   Sulfa Antibiotics    Other reaction(s): Unknown   Codeine Nausea And Vomiting   Morphine Nausea And Vomiting        Medication List        Accurate as of March 20, 2021  3:17 PM. If you have any questions, ask your nurse or doctor.          acetaminophen 500 MG tablet Commonly known as: TYLENOL Take 500-1,000 mg by mouth every 6 (six) hours as needed (for pain.).   amLODipine 5 MG tablet Commonly known as: NORVASC TAKE 1 TABLET (5 MG TOTAL) BY MOUTH DAILY. What changed:  how much to take additional instructions   cefUROXime 250 MG tablet Commonly known as: CEFTIN Take 1 tablet (250 mg total) by mouth 2 (two) times daily with a meal for 7 days. Started by: Debroah Loop, PA-C   Cranberry 1000 MG Caps Take 1 capsule by mouth daily.   estradiol 0.1 MG/GM vaginal cream Commonly known as: ESTRACE Discard applicator-apply one pea size amount to urethra Monday, Wed and Friday night   flecainide 100 MG tablet Commonly known as: TAMBOCOR Take 1 tablet (100 mg total) by mouth 2 (two) times daily.   levothyroxine  175 MCG tablet Commonly known as: SYNTHROID Take 175 mcg by mouth daily.   metoprolol tartrate 25 MG tablet Commonly known as: LOPRESSOR TAKE 1 TABLET BY MOUTH THREE TIMES A DAY   rosuvastatin 10 MG tablet Commonly known as: CRESTOR TAKE ONE HALF TABLET BY MOUTH ONCE A DAY.   VITAMIN B COMPLEX-C PO Take 1 capsule by mouth daily.   Vitamin C 500 MG Caps Take 500 mg by mouth every evening.   Vitamin D-3 125 MCG (5000 UT) Tabs Take 5,000 mg by mouth daily.   Xarelto 20 MG Tabs tablet Generic drug: rivaroxaban TAKE 1 TABLET BY MOUTH EVERY DAY IN THE EVENING        Allergies:  Allergies  Allergen  Reactions   Aspirin Other (See Comments)   Azithromycin Other (See Comments)    unknown   Loratadine Other (See Comments)   Sulfa Antibiotics     Other reaction(s): Unknown   Codeine Nausea And Vomiting   Morphine Nausea And Vomiting    Family History: Family History  Adopted: Yes  Problem Relation Age of Onset   Cervical cancer Mother     Social History:   reports that she has never smoked. She has never used smokeless tobacco. She reports that she does not drink alcohol and does not use drugs.  Physical Exam: BP (!) 142/65   Pulse (!) 57   Wt 250 lb (113.4 kg)   BMI 40.35 kg/m   Constitutional:  Alert and oriented, no acute distress, nontoxic appearing HEENT: [place], AT Cardiovascular: No clubbing, cyanosis, or edema Respiratory: Normal respiratory effort, no increased work of breathing Skin: No rashes, bruises or suspicious lesions Neurologic: Grossly intact, no focal deficits, moving all 4 extremities Psychiatric: Normal mood and affect  Laboratory Data: Results for orders placed or performed in visit on 03/20/21  CULTURE, URINE COMPREHENSIVE   Specimen: Urine   UR  Result Value Ref Range   Urine Culture, Comprehensive Preliminary report (A)    Organism ID, Bacteria Gram negative rods (A)   Microscopic Examination   Urine  Result Value Ref Range   WBC, UA >30 (A) 0 - 5 /hpf   RBC None seen 0 - 2 /hpf   Epithelial Cells (non renal) 0-10 0 - 10 /hpf   Bacteria, UA Many (A) None seen/Few  Urinalysis, Complete  Result Value Ref Range   Specific Gravity, UA 1.025 1.005 - 1.030   pH, UA 5.5 5.0 - 7.5   Color, UA Yellow Yellow   Appearance Ur Cloudy (A) Clear   Leukocytes,UA 2+ (A) Negative   Protein,UA Negative Negative/Trace   Glucose, UA Negative Negative   Ketones, UA Trace (A) Negative   RBC, UA Trace (A) Negative   Bilirubin, UA Negative Negative   Urobilinogen, Ur 2.0 (H) 0.2 - 1.0 mg/dL   Nitrite, UA Positive (A) Negative   Microscopic Examination See  below:    Assessment & Plan:   1. Recurrent UTI UA again grossly infected today, will send for culture and start empiric cefuroxime x7 days.  Given her frequent infections, would like to proceed with a renal ultrasound at this time to rule out underlying structural pathology or stones.  May consider cystoscopy in the future as well.  We discussed also starting d-mannose supplements for UTI prevention.  Based on renal ultrasound results, may consider starting her on a 3 to 28-month course of suppressive antibiotics. - Urinalysis, Complete - CULTURE, URINE COMPREHENSIVE - US RENAL; Future - cefUROXime (CEFTIN) 250  MG tablet; Take 1 tablet (250 mg total) by mouth 2 (two) times daily with a meal for 7 days.  Dispense: 14 tablet; Refill: 0   Return for Will call with results.  Carman Ching, PA-C  [facility] [address], [address] [place], [place] [address] [phone]

## 2021-03-20 NOTE — Telephone Encounter (Signed)
Incoming call from pt on triage line, pt states that she has a hx of rUTIs and became symptomatic again last night. Pt states she had to get up multiple times last night to void, she is also having dysuria. She denies n/v, fever, or chills. Pt added to today's schedule as work in.

## 2021-03-23 DIAGNOSIS — E039 Hypothyroidism, unspecified: Secondary | ICD-10-CM | POA: Diagnosis not present

## 2021-03-23 DIAGNOSIS — E785 Hyperlipidemia, unspecified: Secondary | ICD-10-CM | POA: Diagnosis not present

## 2021-03-24 DIAGNOSIS — Z1212 Encounter for screening for malignant neoplasm of rectum: Secondary | ICD-10-CM | POA: Diagnosis not present

## 2021-03-24 LAB — CULTURE, URINE COMPREHENSIVE

## 2021-03-30 DIAGNOSIS — I480 Paroxysmal atrial fibrillation: Secondary | ICD-10-CM | POA: Diagnosis not present

## 2021-03-30 DIAGNOSIS — R82998 Other abnormal findings in urine: Secondary | ICD-10-CM | POA: Diagnosis not present

## 2021-03-30 DIAGNOSIS — E785 Hyperlipidemia, unspecified: Secondary | ICD-10-CM | POA: Diagnosis not present

## 2021-03-30 DIAGNOSIS — M4697 Unspecified inflammatory spondylopathy, lumbosacral region: Secondary | ICD-10-CM | POA: Diagnosis not present

## 2021-03-30 DIAGNOSIS — Z1331 Encounter for screening for depression: Secondary | ICD-10-CM | POA: Diagnosis not present

## 2021-03-30 DIAGNOSIS — G4733 Obstructive sleep apnea (adult) (pediatric): Secondary | ICD-10-CM | POA: Diagnosis not present

## 2021-03-30 DIAGNOSIS — Z1339 Encounter for screening examination for other mental health and behavioral disorders: Secondary | ICD-10-CM | POA: Diagnosis not present

## 2021-03-30 DIAGNOSIS — E039 Hypothyroidism, unspecified: Secondary | ICD-10-CM | POA: Diagnosis not present

## 2021-03-30 DIAGNOSIS — Z0000 Encounter for general adult medical examination without abnormal findings: Secondary | ICD-10-CM | POA: Diagnosis not present

## 2021-03-30 DIAGNOSIS — Z23 Encounter for immunization: Secondary | ICD-10-CM | POA: Diagnosis not present

## 2021-04-01 ENCOUNTER — Other Ambulatory Visit: Payer: Self-pay

## 2021-04-01 ENCOUNTER — Ambulatory Visit (HOSPITAL_COMMUNITY)
Admission: RE | Admit: 2021-04-01 | Discharge: 2021-04-01 | Disposition: A | Discharge status: Home / Self Care | Payer: PPO | Source: Ambulatory Visit | Attending: Physician Assistant | Admitting: Physician Assistant

## 2021-04-01 DIAGNOSIS — N390 Urinary tract infection, site not specified: Secondary | ICD-10-CM | POA: Insufficient documentation

## 2021-04-01 DIAGNOSIS — N281 Cyst of kidney, acquired: Secondary | ICD-10-CM | POA: Diagnosis not present

## 2021-04-03 ENCOUNTER — Other Ambulatory Visit: Payer: Self-pay

## 2021-04-03 ENCOUNTER — Other Ambulatory Visit (HOSPITAL_COMMUNITY): Payer: Self-pay

## 2021-04-03 MED ORDER — RIVAROXABAN 20 MG PO TABS
20.0000 mg | ORAL_TABLET | Freq: Every evening | ORAL | 3 refills | Status: DC
  Filled 2021-04-03: qty 90, 90d supply, fill #0

## 2021-04-04 ENCOUNTER — Other Ambulatory Visit: Payer: Self-pay | Admitting: Student

## 2021-04-04 DIAGNOSIS — I480 Paroxysmal atrial fibrillation: Secondary | ICD-10-CM

## 2021-04-06 NOTE — Telephone Encounter (Signed)
Okay to refill? 

## 2021-04-14 ENCOUNTER — Other Ambulatory Visit: Payer: Self-pay | Admitting: Student

## 2021-04-29 ENCOUNTER — Other Ambulatory Visit: Payer: Self-pay

## 2021-04-29 ENCOUNTER — Encounter: Payer: Self-pay | Admitting: Physician Assistant

## 2021-04-29 ENCOUNTER — Encounter (HOSPITAL_BASED_OUTPATIENT_CLINIC_OR_DEPARTMENT_OTHER): Payer: Self-pay

## 2021-04-29 ENCOUNTER — Ambulatory Visit: Payer: PPO | Admitting: Physician Assistant

## 2021-04-29 VITALS — BP 158/79 | HR 59 | Ht 66.0 in | Wt 263.0 lb

## 2021-04-29 DIAGNOSIS — R21 Rash and other nonspecific skin eruption: Secondary | ICD-10-CM | POA: Diagnosis not present

## 2021-04-29 DIAGNOSIS — N390 Urinary tract infection, site not specified: Secondary | ICD-10-CM | POA: Diagnosis not present

## 2021-04-29 DIAGNOSIS — Z5321 Procedure and treatment not carried out due to patient leaving prior to being seen by health care provider: Secondary | ICD-10-CM | POA: Insufficient documentation

## 2021-04-29 LAB — URINALYSIS, COMPLETE
Specific Gravity, UA: 1.01 (ref 1.005–1.030)
pH, UA: 6 (ref 5.0–7.5)

## 2021-04-29 LAB — MICROSCOPIC EXAMINATION
Epithelial Cells (non renal): NONE SEEN /hpf (ref 0–10)
WBC, UA: >30 /hpf — AB (ref 0–5)

## 2021-04-29 MED ORDER — CEFUROXIME AXETIL 250 MG PO TABS: 250.0000 mg | ORAL_TABLET | Freq: Two times a day (BID) | ORAL | 0 refills | Status: DC

## 2021-04-29 NOTE — ED Notes (Signed)
Called for a vitals check and no answer

## 2021-04-29 NOTE — Progress Notes (Signed)
In and Out Catheterization  Patient is present today for a I & O catheterization due to rUTI. Patient was cleaned and prepped in a sterile fashion with betadine . A 14FR cath was inserted no complications were noted , of urine return was noted, urine was orange in color. A clean urine sample was collected for urinalysis. Bladder was drained  And catheter was removed with out difficulty.    Performed by: Franchot Erichsen CMA & Gerarda Gunther RMA

## 2021-04-29 NOTE — Progress Notes (Signed)
04/29/2021 3:21 PM   [name] [name] 12-14-1950 [id number]  CC: Chief Complaint  Patient presents with   Recurrent UTI   HPI: [name] [name] is a 70 y.o. female with PMH recurrent UTI on topical vaginal estrogen cream, cranberry supplements, and daily probiotics; atrophic vaginitis; and stage II/III cystocele who presents today for evaluation of possible UTI.   Today she reports a 1 day history of dysuria.  She is taken Azo, which helped.  She states her symptoms were completely resolved by her last course of antibiotics; I prescribed her cefuroxime on 03/20/2021.  Urine culture at the time grew pansensitive E. coli.  She remains extremely frustrated with her frequency of infection and wonders if she is doing anything wrong to contribute to these.  She wipes front to back, does not douche, does not take baths.  She also reports some bother from her cystocele and wonders if bladder repair would reduce her risk for infection.  In-office UA today positive for orange color, otherwise unable to read due to pigment interference; urine microscopy with >30 WBCs/HPF and moderate bacteria.  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   Exogenous obesity    Fibromyalgia    Hyperlipidemia    Hypothyroidism    Morbid obesity (HCC)    OSA (obstructive sleep apnea)    uses CPAP   Palpitations 07/13/2018   Paroxysmal atrial flutter (HCC)    Periodic limb movement disorder    REM sleep behavior disorder    Rhinitis    SOB (shortness of breath)    Spinal stenosis    Unspecified hypothyroidism    Surgical History: Past Surgical History:  Procedure Laterality Date   CARDIOVERSION N/A 04/29/2015   Procedure: CARDIOVERSION;  Surgeon: Yates Decamp, MD;  Location: [facility] ENDOSCOPY;  Service: Cardiovascular;  Laterality: N/A;   CARDIOVERSION N/A 02/26/2020   Procedure: CARDIOVERSION;  Surgeon: Yates Decamp, MD;  Location: [facility]  ENDOSCOPY;  Service: Cardiovascular;  Laterality: N/A;   CARDIOVERSION N/A 03/11/2020   Procedure: CARDIOVERSION;  Surgeon: Yates Decamp, MD;  Location: [facility] ENDOSCOPY;  Service: Cardiovascular;  Laterality: N/A;   CARPAL TUNNEL RELEASE     Bilateral   DILATION AND CURETTAGE OF UTERUS     left rotator cuff     resection uterine polyp  2011   Home Medications:  Allergies as of 04/29/2021       Reactions   Aspirin Other (See Comments)   Azithromycin Other (See Comments)   unknown   Loratadine Other (See Comments)   Sulfa Antibiotics    Other reaction(s): Unknown   Codeine Nausea And Vomiting   Morphine Nausea And Vomiting        Medication List        Accurate as of April 29, 2021  3:21 PM. If you have any questions, ask your nurse or doctor.          acetaminophen 500 MG tablet Commonly known as: TYLENOL Take 500-1,000 mg by mouth every 6 (six) hours as needed (for pain.).   amLODipine 5 MG tablet Commonly known as: NORVASC TAKE 1 TABLET (5 MG TOTAL) BY MOUTH DAILY.   Cranberry 1000 MG Caps Take 1 capsule by mouth daily.   estradiol 0.1 MG/GM vaginal cream Commonly known as: ESTRACE Discard applicator-apply one pea size amount to urethra Monday, Wed and Friday night   flecainide 100 MG tablet Commonly known as: TAMBOCOR TAKE 1 TABLET BY  MOUTH TWICE A DAY   levothyroxine 175 MCG tablet Commonly known as: SYNTHROID Take 175 mcg by mouth daily.   metoprolol tartrate 25 MG tablet Commonly known as: LOPRESSOR TAKE 1 TABLET BY MOUTH THREE TIMES A DAY   rivaroxaban 20 MG Tabs tablet Commonly known as: Xarelto Take 1 tablet (20 mg total) by mouth every evening.   rosuvastatin 10 MG tablet Commonly known as: CRESTOR TAKE ONE HALF TABLET BY MOUTH ONCE A DAY.   VITAMIN B COMPLEX-C PO Take 1 capsule by mouth daily.   Vitamin C 500 MG Caps Take 500 mg by mouth every evening.   Vitamin D-3 125 MCG (5000 UT) Tabs Take 5,000 mg by mouth daily.         Allergies:  Allergies  Allergen Reactions   Aspirin Other (See Comments)   Azithromycin Other (See Comments)    unknown   Loratadine Other (See Comments)   Sulfa Antibiotics     Other reaction(s): Unknown   Codeine Nausea And Vomiting   Morphine Nausea And Vomiting    Family History: Family History  Adopted: Yes  Problem Relation Age of Onset   Cervical cancer Mother     Social History:   reports that she has never smoked. She has never used smokeless tobacco. She reports that she does not drink alcohol and does not use drugs.  Physical Exam: BP (!) 158/79 (BP Location: Left Arm, Patient Position: Sitting, Cuff Size: Large)    Pulse (!) 59    Ht 5\' 6"  (1.676 m)    Wt 263 lb (119.3 kg)    BMI 42.45 kg/m   Constitutional:  Alert and oriented, no acute distress, nontoxic appearing HEENT: [place], AT Cardiovascular: No clubbing, cyanosis, or edema Respiratory: Normal respiratory effort, no increased work of breathing Skin: No rashes, bruises or suspicious lesions Neurologic: Grossly intact, no focal deficits, moving all 4 extremities Psychiatric: Normal mood and affect  Laboratory Data: Results for orders placed or performed in visit on 04/29/21  Microscopic Examination   Urine  Result Value Ref Range   WBC, UA >30 (A) 0 - 5 /hpf   RBC 0-2 0 - 2 /hpf   Epithelial Cells (non renal) None seen 0 - 10 /hpf   Bacteria, UA Moderate (A) None seen/Few  Urinalysis, Complete  Result Value Ref Range   Specific Gravity, UA 1.010 1.005 - 1.030   pH, UA 6.0 5.0 - 7.5   Color, UA Orange Yellow   Appearance Ur Cloudy (A) Clear   Protein,UA CANCELED    Glucose, UA CANCELED    Ketones, UA CANCELED    Microscopic Examination See below:    Assessment & Plan:   1. Recurrent UTI UA again notable for pyuria and bacteriuria consistent with acute cystitis.  Patient reports complete resolution of her symptoms with antibiotics.  Will start empiric cefuroxime and send for culture for further  evaluation.  With a normal recent renal ultrasound, I recommended proceeding with cystoscopy to rule out lower tract abnormalities that could be contributing to infection.  We will schedule this.  If normal, may consider suppressive antibiotics.  We discussed that cystocele repair is not anticipated to make a significant improvement in her frequency of infection. - Urinalysis, Complete - CULTURE, URINE COMPREHENSIVE - cefUROXime (CEFTIN) 250 MG tablet; Take 1 tablet (250 mg total) by mouth 2 (two) times daily with a meal for 7 days.  Dispense: 14 tablet; Refill: 0  Return in about 1 week (around 05/06/2021) for Cysto  with Dr. Apolinar Junes.  Carman Ching, PA-C  [facility] [address] [place], [place] [address] [phone]

## 2021-04-29 NOTE — ED Triage Notes (Signed)
Pt presents to the ED after taking her first dose of ceftin for a UTI and developing a rash. Pt states that the rash was itchy and puffy. States that she took 50mg  of benadryl around 1840 today and states that the rash is getting better and the itching is subsiding. Denies difficulty swallowing or breathing. Lungs clear bilaterally. Pt A&Ox4 at time of triage VSS.

## 2021-04-30 ENCOUNTER — Other Ambulatory Visit: Payer: Self-pay | Admitting: Physician Assistant

## 2021-04-30 ENCOUNTER — Telehealth: Payer: Self-pay | Admitting: *Deleted

## 2021-04-30 ENCOUNTER — Emergency Department (HOSPITAL_BASED_OUTPATIENT_CLINIC_OR_DEPARTMENT_OTHER)
Admission: EM | Admit: 2021-04-30 | Discharge: 2021-04-30 | Discharge status: Against Medical Advice | Payer: PPO | Attending: Emergency Medicine | Admitting: Emergency Medicine

## 2021-04-30 DIAGNOSIS — N390 Urinary tract infection, site not specified: Secondary | ICD-10-CM

## 2021-04-30 MED ORDER — NITROFURANTOIN MONOHYD MACRO 100 MG PO CAPS: 100.0000 mg | ORAL_CAPSULE | Freq: Two times a day (BID) | ORAL | 0 refills | Status: DC

## 2021-04-30 MED ORDER — CIPROFLOXACIN HCL 250 MG PO TABS: 250.0000 mg | ORAL_TABLET | Freq: Two times a day (BID) | ORAL | 0 refills | Status: AC

## 2021-04-30 NOTE — Progress Notes (Signed)
Macrobid sent to [facility], patient subsequently reported remote Macrobid allergy. Chart has been updated. Cipro 250 mg BID x5 days sent instead.

## 2021-04-30 NOTE — Telephone Encounter (Signed)
Patient called Triage line to report taking one dose of Ceftin which caused rash, swelling and dermatitis She went to Urgent care, took one dose of benadryl and symptoms subsided. Feeling much better this morning. Denies any worsening symptoms. Updated patient's record for allergy/contraindication. Patient requests alternate [facility] to [facility] [place] the beach this morning.

## 2021-04-30 NOTE — Progress Notes (Signed)
Patient notified, voiced understanding.

## 2021-05-03 LAB — CULTURE, URINE COMPREHENSIVE

## 2021-05-07 ENCOUNTER — Telehealth: Payer: Self-pay | Admitting: *Deleted

## 2021-05-07 NOTE — Telephone Encounter (Signed)
Notified patient as instructed, patient pleased. Discussed follow-up appointments, patient agrees  

## 2021-05-07 NOTE — Telephone Encounter (Signed)
-----   Message from Nori Riis, PA-C sent at 05/07/2021  8:04 AM EST ----- Please let Mrs. Hillsman know that her urine culture was positive for infection and that the Cipro that was sent in for her is the appropriate antibiotic and to finish all the tablets and to keep her scheduled appointment with Dr. Erlene Quan for cystoscopy on the 10th.

## 2021-05-11 NOTE — Progress Notes (Signed)
° °  05/13/21  CC:  Chief Complaint  Patient presents with   Cysto     HPI: [name] [name] is a 71 y.o.female with a personal history of recurrent UTI, atrophic vaginitis, pelvic floor prolapse, mixed urinary incontinence, and stage II/III cystocele, who presents today for a cystoscopy.  She underwent UDS on 12/22/2020.   Her recurrent UTIs are managed with topical estrogen cream, cranberry supplements and daily probiotics.   She was seen in clinic by Carman Ching, PA-C, on  04/29/2021 for suspected UTI. Urine culture grew E.coli she was treated with ciprofloxacin.   She is doing well today. She reports that she continue to use vaginal estrogen cream.    NED. A&Ox3.   No respiratory distress   Abd soft, NT, ND Normal external genitalia with patent urethral meatus  Cystoscopy Procedure Note  Patient identification was confirmed, informed consent was obtained, and patient was prepped using Betadine solution.  Lidocaine jelly was administered per urethral meatus.    Procedure: - Flexible cystoscope introduced, without any difficulty.   - Thorough search of the bladder revealed:    normal urethral meatus    normal urothelium    no stones    no ulcers     no tumors    no urethral polyps    no trabeculation   - subtle trigonitis  - Ureteral orifices were normal in position and appearance.  Post-Procedure: - Patient tolerated the procedure well  Assessment/ Plan:  Recurrent UTIs - Urinalysis unremarkable today - Cystoscopy reassuring today  - Placed on suppressive trimethoprim x 6 months in light of recent infections x multiple - Continue vaginal estrogen cream   Return in 6 months for UA and recheck of symptoms  I,Charlotta Lapaglia,acting as a scribe for Vanna Scotland, MD.,have documented all relevant documentation on the behalf of Vanna Scotland, MD,as directed by  Vanna Scotland, MD while in the presence of Vanna Scotland, MD.  I have reviewed the above  documentation for accuracy and completeness, and I agree with the above.   Vanna Scotland, MD

## 2021-05-12 ENCOUNTER — Other Ambulatory Visit: Payer: Self-pay

## 2021-05-12 ENCOUNTER — Ambulatory Visit: Payer: PPO | Admitting: Urology

## 2021-05-12 DIAGNOSIS — N390 Urinary tract infection, site not specified: Secondary | ICD-10-CM | POA: Diagnosis not present

## 2021-05-12 LAB — URINALYSIS, COMPLETE
Bilirubin, UA: NEGATIVE
Glucose, UA: NEGATIVE
Ketones, UA: NEGATIVE
Nitrite, UA: NEGATIVE
Protein,UA: NEGATIVE
RBC, UA: NEGATIVE
Specific Gravity, UA: <1.005 — ABNORMAL LOW (ref 1.005–1.030)
Urobilinogen, Ur: 0.2 mg/dL (ref 0.2–1.0)
pH, UA: 6 (ref 5.0–7.5)

## 2021-05-12 LAB — MICROSCOPIC EXAMINATION: RBC, Urine: NONE SEEN /hpf (ref 0–2)

## 2021-05-12 MED ORDER — TRIMETHOPRIM 100 MG PO TABS: 100.0000 mg | ORAL_TABLET | Freq: Every day | ORAL | 1 refills | Status: DC

## 2021-06-29 NOTE — Progress Notes (Signed)
Primary Physician:  Burnard Bunting, MD  Patient ID: [name] [name], female    DOB: Sep 10, 1950, 71 y.o.   MRN: [id number]  Subjective:   Chief Complaint  Patient presents with   Atrial Fibrillation   Follow-up   HPI: [name] [name]  is a 71 y.o. female  with paroxysmal atypical atrial flutter, hyperlipidemia, morbid obesity and hypertension. She was found to be in atrial flutter with rapid ventricular response on 03/13/2015.  It was felt to be atypical atrial flutter and was evaluated by Dr. [name], recommended cardioversion, successful direct current cardioversion on 04/29/2015 with no recurrence.  02/14/2020 patient presented in atrial fibrillation with rapid ventricular response and underwent successful cardioversion 02/26/2020. She again underwent successful cardioversion from atrial fibrillation to sinus rhythm on 03/11/2020.   Previously unable to tolerate higher doses of amlodipine due to dizziness and unable to tolerate HCTZ due to worsening renal function.  Patient presents for 73-month follow-up.  Last office visit patient was stable from a cardiovascular standpoint and had had no known recurrence of atrial fibrillation, therefore no changes were made.  With the exception of recurrent UTIs for which she is following with [facility] urology, patient is asymptomatic.  She has had no known recurrence of atrial fibrillation.  Denies dyspnea, chest pain, palpitations.  She continues to tolerate Xarelto without bleeding diathesis.  Lipids are well controlled.  Since blood pressure is uncontrolled at the office today, however she does report home readings are under better control averaging 193-790 mmHg systolic.  Past Medical History:  Diagnosis Date   Anxiety and depression    Aortic arch atherosclerosis ([place]) 07/13/2018   Exogenous obesity    Fibromyalgia    Hyperlipidemia    Hypothyroidism    Morbid obesity (HCC)    OSA (obstructive sleep apnea)    uses CPAP   Palpitations  07/13/2018   Paroxysmal atrial flutter (HCC)    Periodic limb movement disorder    REM sleep behavior disorder    Rhinitis    SOB (shortness of breath)    Spinal stenosis    Unspecified hypothyroidism    Family History  Adopted: Yes  Problem Relation Age of Onset   Cervical cancer Mother    Past Surgical History:  Procedure Laterality Date   CARDIOVERSION N/A 04/29/2015   Procedure: CARDIOVERSION;  Surgeon: Adrian Prows, MD;  Location: [facility] ENDOSCOPY;  Service: Cardiovascular;  Laterality: N/A;   CARDIOVERSION N/A 02/26/2020   Procedure: CARDIOVERSION;  Surgeon: Adrian Prows, MD;  Location: [facility] ENDOSCOPY;  Service: Cardiovascular;  Laterality: N/A;   CARDIOVERSION N/A 03/11/2020   Procedure: CARDIOVERSION;  Surgeon: Adrian Prows, MD;  Location: [place];  Service: Cardiovascular;  Laterality: N/A;   CARPAL TUNNEL RELEASE     Bilateral   DILATION AND CURETTAGE OF UTERUS     left rotator cuff     resection uterine polyp  2011   Social History   Tobacco Use   Smoking status: Never   Smokeless tobacco: Never  Substance Use Topics   Alcohol use: No    Review of Systems  Constitutional: Negative for malaise/fatigue.  Cardiovascular:  Negative for chest pain, claudication, dyspnea on exertion, leg swelling, near-syncope, orthopnea, palpitations, paroxysmal nocturnal dyspnea and syncope.  Musculoskeletal:  Positive for arthritis (left hip) and back pain.   Objective:  Blood pressure (!) 142/78, pulse (!) 56, temperature 98 F (36.7 C), temperature source Temporal, height $RemoveBeforeDE'5\' 6"'AlLdninZLJWZnBz$  (1.676 m), weight 262 lb (118.8 kg), SpO2 96 %. Body mass index  is 42.29 kg/m.   Vitals with BMI 07/01/2021 07/01/2021 04/29/2021  Height - $Remove'5\' 6"'RVhKnvN$  -  Weight - 262 lbs -  BMI - 00.93 -  Systolic 818 299 371  Diastolic 78 80 73  Pulse 56 56 60    Orthostatic VS for the past 72 hrs (Last 3 readings):  Patient Position BP Location Cuff Size  07/01/21 1110 Sitting Left Wrist Normal  07/01/21 1055 Sitting Left  Wrist Normal    Physical Exam Vitals reviewed.  Constitutional:      Appearance: She is obese.  Neck:     Comments: Short neck and difficult to evaluate JVP Cardiovascular:     Rate and Rhythm: Normal rate and regular rhythm.     Pulses: Intact distal pulses.     Heart sounds: Normal heart sounds, S1 normal and S2 normal. No murmur heard.   No gallop.  Pulmonary:     Effort: Pulmonary effort is normal. No respiratory distress.     Breath sounds: Normal breath sounds. No wheezing, rhonchi or rales.  Abdominal:     Comments: Obese  Musculoskeletal:     Right lower leg: No edema.     Left lower leg: No edema.  Neurological:     Mental Status: She is alert.   Laboratory examination:   CMP Latest Ref Rng & Units 05/28/2020 03/11/2020 03/11/2020  Glucose 65 - 99 mg/dL 86 101(H) 111(H)  BUN 8 - 27 mg/dL 20 17 24(H)  Creatinine 0.57 - 1.00 mg/dL 1.16(H) 1.10(H) 1.10(H)  Sodium 134 - 144 mmol/L 139 143 137  Potassium 3.5 - 5.2 mmol/L 4.3 4.1 6.5(HH)  Chloride 96 - 106 mmol/L 100 106 106  CO2 20 - 29 mmol/L 26 - -  Calcium 8.7 - 10.3 mg/dL 9.0 - -  Total Protein 6.0 - 8.5 g/dL - - -  Total Bilirubin 0.0 - 1.2 mg/dL - - -  Alkaline Phos 39 - 117 IU/L - - -  AST 0 - 40 IU/L - - -  ALT 0 - 32 IU/L - - -   CBC Latest Ref Rng & Units 03/11/2020 03/11/2020 02/14/2020  WBC 3.4 - 10.8 x10E3/uL - - 10.2  Hemoglobin 12.0 - 15.0 g/dL 13.9 13.3 13.4  Hematocrit 36.0 - 46.0 % 41.0 39.0 41.2  Platelets 150 - 450 x10E3/uL - - 196   Lipid Panel     Component Value Date/Time   CHOL 129 11/14/2018 0830   TRIG 83 11/14/2018 0830   HDL 49 11/14/2018 0830   LDLCALC 63 11/14/2018 0830   HEMOGLOBIN A1C No results found for: HGBA1C, MPG TSH No results for input(s): TSH in the last 8760 hours.  External labs: 03/23/2021: BUN 18, creatinine 1.16, GFR 54, potassium 4.4 Hgb 13.9 Total cholesterol 130, HDL 53, LDL 66, triglycerides 53  03/17/2020:  BUN 13, creatinine 1.0, EGFR 54.8, sodium  139, potassium 4.6 Hemoglobin 12.9, hematocrit 39.7, platelets 185 Total cholesterol 108, triglycerides 57, HDL 44, LDL 53  02/27/2019: HDL 46, LDL 63, triglycerides 76, total cholesterol 124 BUN 14, creatinine 0.90, TSH 0.84  11/21/2017: Cholesterol 133, triglycerides 88, HDL 47, LDL 88.  Creatinine 0.94, EGFR 63/72, potassium 4.7, CMP normal.  CBC normal.  Allergies   Allergies  Allergen Reactions   Aspirin Other (See Comments)   Azithromycin Other (See Comments)    unknown   Loratadine Other (See Comments)   Macrobid [Nitrofurantoin] Hives   Sulfa Antibiotics     Other reaction(s): Unknown   Ceftin [Cefuroxime] Itching and  Rash   Codeine Nausea And Vomiting   Morphine Nausea And Vomiting    Medications Prior to Visit:   Outpatient Medications Prior to Visit  Medication Sig Dispense Refill   acetaminophen (TYLENOL) 500 MG tablet Take 500-1,000 mg by mouth every 6 (six) hours as needed (for pain.).     amLODipine (NORVASC) 5 MG tablet TAKE 1 TABLET (5 MG TOTAL) BY MOUTH DAILY. (Patient taking differently: 1/2 tablet) 90 tablet 1   Ascorbic Acid (VITAMIN C) 500 MG CAPS Take 500 mg by mouth every evening.      Cholecalciferol (VITAMIN D-3) 125 MCG (5000 UT) TABS Take 5,000 mg by mouth daily.     Cranberry 1000 MG CAPS Take 1 capsule by mouth daily.     estradiol (ESTRACE) 0.1 MG/GM vaginal cream Discard applicator-apply one pea size amount to urethra Monday, Wed and Friday night 42.5 g 12   flecainide (TAMBOCOR) 100 MG tablet TAKE 1 TABLET BY MOUTH TWICE A DAY 180 tablet 3   levothyroxine (SYNTHROID) 175 MCG tablet Take 175 mcg by mouth daily.     metoprolol tartrate (LOPRESSOR) 25 MG tablet TAKE 1 TABLET BY MOUTH THREE TIMES A DAY 270 tablet 3   rivaroxaban (XARELTO) 20 MG TABS tablet Take 1 tablet (20 mg total) by mouth every evening. 90 tablet 3   rosuvastatin (CRESTOR) 10 MG tablet TAKE ONE HALF TABLET BY MOUTH ONCE A DAY. 45 tablet 3   trimethoprim (TRIMPEX) 100 MG tablet  Take 1 tablet (100 mg total) by mouth daily. 90 tablet 1   VITAMIN B COMPLEX-C PO Take 1 capsule by mouth daily.     No facility-administered medications prior to visit.   Final Medications at End of Visit    Current Meds  Medication Sig   acetaminophen (TYLENOL) 500 MG tablet Take 500-1,000 mg by mouth every 6 (six) hours as needed (for pain.).   amLODipine (NORVASC) 5 MG tablet TAKE 1 TABLET (5 MG TOTAL) BY MOUTH DAILY. (Patient taking differently: 1/2 tablet)   Ascorbic Acid (VITAMIN C) 500 MG CAPS Take 500 mg by mouth every evening.    Cholecalciferol (VITAMIN D-3) 125 MCG (5000 UT) TABS Take 5,000 mg by mouth daily.   Cranberry 1000 MG CAPS Take 1 capsule by mouth daily.   estradiol (ESTRACE) 0.1 MG/GM vaginal cream Discard applicator-apply one pea size amount to urethra Monday, Wed and Friday night   flecainide (TAMBOCOR) 100 MG tablet TAKE 1 TABLET BY MOUTH TWICE A DAY   levothyroxine (SYNTHROID) 175 MCG tablet Take 175 mcg by mouth daily.   metoprolol tartrate (LOPRESSOR) 25 MG tablet TAKE 1 TABLET BY MOUTH THREE TIMES A DAY   rivaroxaban (XARELTO) 20 MG TABS tablet Take 1 tablet (20 mg total) by mouth every evening.   rosuvastatin (CRESTOR) 10 MG tablet TAKE ONE HALF TABLET BY MOUTH ONCE A DAY.   trimethoprim (TRIMPEX) 100 MG tablet Take 1 tablet (100 mg total) by mouth daily.   VITAMIN B COMPLEX-C PO Take 1 capsule by mouth daily.   Radiology:   No results found.  Cardiac Studies:   PCV MYOCARDIAL PERFUSION WO LEXISCAN 04/07/2020 Lexiscan/modified Bruce nuclear stress test performed using 1-day protocol. Patient reached 5 mets and 80% MPHR. Stress EKG at 80% MPHR showed sinus tachcyardia, no ischemic changes. Normal myocardial perfusion. Stress LVEF 64%. Low risk study.  Direct-current cardioversion 03/11/2020: Converted to normal sinus rhythm with 120 J of electricity x1.  Direct-current cardioversion 02/26/2020:  A flutter to A. fib to normal  sinus rhythm with 75x 1  then 150 Joules of electricity X 1  PCV ECHOCARDIOGRAM COMPLETE 11/94/1740 Normal LV systolic function with EF 58%. Left ventricle cavity is normal in size. Normal global wall motion. Indeterminate diastolic filling pattern. Calculated EF 58%. Left atrial cavity is mildly dilated. Structurally normal mitral valve.  Mild (Grade I) mitral regurgitation. Structurally normal tricuspid valve with trace regurgitation. No evidence of pulmonary hypertension. Patient appeared to be in atrial flutter during examination. Compared to 03/19/2015, rhythm abnormal now.  Sleep Study 2006: Used CPAP. Repeat sleep study. 05/28/2015 by Dr. Caryl Comes: No e/o sleep apnea.   Chest x-ray 04/30/2015: Mild CHF, atherosclerotic calcification the aortic arch.   Direct current cardioversion 04/29/2015: A. Flutter to NSR with 75J x 1.   Treadmill Exercise stress 10/11/2014: Indication: Palpitations, Chest pain The patient exercised according to Bruce Protocol, Total time recorded 04:52min achieving max heart rate of 141 which was 90% of MPHR for age and 5.80 METS of work. Normal BP response. Resting ECG NSR, low voltage complexes. There was no ST-T changes of ischemia with exercise stress test. Stress terminated due to THR (>85% MPHR)/MPHR met and fatigue. Decreased exercise tolerence.   EKG:  07/01/2021: Sinus rhythm at a rate of 57 bpm with first-degree AV block.  Normal axis.  Incomplete right bundle branch block.  Poor R wave progression, cannot exclude anteroseptal infarct old.  No evidence of ischemia or underlying injury pattern.  Compared to EKG 12/31/2020, no significant change.  EKG 12/31/2020: Sinus bradycardia with first-degree AV block at a rate of 50 bpm.  Normal axis.  Incomplete right bundle branch block.  Poor R wave progression, cannot exclude anteroseptal infarct old.  No evidence of ischemia or underlying injury pattern.  Compared to EKG 05/21/2020, no significant change.  EKG 03/03/2020: Atrial fibrillation  with controlled ventricular response at a rate of 94 bpm.  Normal axis.  Incomplete right bundle branch block.  Poor R wave progression cannot exclude anteroseptal infarct old.  Compared to EKG 02/14/2020, ventricular rate has improved.  EKG 07/27/2019: Normal sinus rhythm with rate of 64 bpm, left abnormality, normal axis.  No evidence of ischemia, normal EKG.   No significant change from EKG 01/23/2019.   Assessment:     ICD-10-CM   1. Paroxysmal atrial fibrillation (HCC)  I48.0 EKG 12-Lead    2. Essential hypertension  I10     3. Mixed hyperlipidemia  E78.2      No orders of the defined types were placed in this encounter.   There are no discontinued medications.  This patients CHA2DS2-VASc Score 3 (HTN, Age, F) and yearly risk of stroke 3.2%.   Recommendations:    [name] [name]  is a 71 y.o.  with paroxysmal atypical atrial flutter, hyperlipidemia, morbid obesity and hypertension. She was found to be in atrial flutter with rapid ventricular response on 03/13/2015.  It was felt to be atypical atrial flutter and was evaluated by Dr. [name], recommended cardioversion, successful direct current cardioversion on 04/29/2015 with no recurrence.  02/14/2020 patient presented in atrial fibrillation with rapid ventricular response and underwent successful cardioversion 02/26/2020.  She again underwent successful cardioversion from atrial fibrillation to sinus rhythm on 03/11/2020.   Patient presents for 21-month follow-up.  Last office visit patient was stable from a cardiovascular standpoint and had had no known recurrence of atrial fibrillation, therefore no changes were made.  Patient remains asymptomatic from a cardiovascular standpoint.  There is no clinical evidence of heart failure and she  is tolerating Xarelto without bleeding diathesis.  We will continue current medications.  However blood pressure is not well controlled.  Shared decision was for patient to focus on reducing sodium  intake in her diet and monitor blood pressure closely at home.  She will call us back in 2 weeks with blood pressure readings.  Could consider addition of hydralazine if patient needs additional antihypertensive medications.  I personally reviewed external labs, lipids are well controlled.  Follow-up in 6 weeks, sooner if needed for hypertension.   Alethia Berthold, PA-C 07/01/2021, 11:56 AM Office: [phone]

## 2021-07-01 ENCOUNTER — Encounter: Payer: Self-pay | Admitting: Student

## 2021-07-01 ENCOUNTER — Other Ambulatory Visit: Payer: Self-pay

## 2021-07-01 ENCOUNTER — Ambulatory Visit: Payer: PPO | Admitting: Student

## 2021-07-01 VITALS — BP 142/78 | HR 56 | Temp 98.0°F | Ht 66.0 in | Wt 262.0 lb

## 2021-07-01 DIAGNOSIS — I10 Essential (primary) hypertension: Secondary | ICD-10-CM | POA: Diagnosis not present

## 2021-07-01 DIAGNOSIS — I480 Paroxysmal atrial fibrillation: Secondary | ICD-10-CM

## 2021-07-01 DIAGNOSIS — E782 Mixed hyperlipidemia: Secondary | ICD-10-CM

## 2021-07-22 ENCOUNTER — Other Ambulatory Visit: Payer: Self-pay | Admitting: Student

## 2021-07-27 ENCOUNTER — Telehealth: Payer: Self-pay

## 2021-07-27 DIAGNOSIS — I10 Essential (primary) hypertension: Secondary | ICD-10-CM

## 2021-07-27 NOTE — Telephone Encounter (Signed)
Called and spoke to pt, pt voice understanding. Pt is concerned about her potassium levels and wants to know if you could order labs for her to get done. Please advise.

## 2021-07-27 NOTE — Telephone Encounter (Signed)
Pt called regarding her BP readings from the last two weeks starting on 07/02/2021. Pt took them daily as listed below: ? ?03/02- BP 136/70 HR 55 ?03/03- BP 117/61 HR 59 ?03/04- BP 144/78 HR 55 ?03/05- BP 148/81 HR 51 ?03/06- BP 121/63 HR 57 ?03/07- BP 141/64 HR 57 ?03/08- BP 145/69 HR 56 ?03/09- BP 144/63 HR 52 ?03/10- BP 132/68 HR 59 ?03/11- BP 130/67 HR 59 ?03/12- BP 149/69 HR 53 ?03/13- BP 128/68 HR 52 ?03/14- BP 135/72 HR 52 ?03/15- BP 142/72 HR 56 ? ? ?

## 2021-07-27 NOTE — Telephone Encounter (Signed)
Blood pressure is okay, however there is still some room for improvement. Will not make mediation is at this time, however please encourage patient to really focus on strict sodium reduction and weight loss.

## 2021-07-29 NOTE — Telephone Encounter (Signed)
Sure, order has been placed.

## 2021-07-29 NOTE — Telephone Encounter (Signed)
Called and spoke to pt, pt voiced understanding.

## 2021-07-30 DIAGNOSIS — I10 Essential (primary) hypertension: Secondary | ICD-10-CM | POA: Diagnosis not present

## 2021-07-31 LAB — BASIC METABOLIC PANEL
BUN/Creatinine Ratio: 16 (ref 12–28)
BUN: 16 mg/dL (ref 8–27)
CO2: 26 mmol/L (ref 20–29)
Calcium: 9.7 mg/dL (ref 8.7–10.3)
Chloride: 102 mmol/L (ref 96–106)
Creatinine, Ser: 0.98 mg/dL (ref 0.57–1.00)
Glucose: 105 mg/dL — ABNORMAL HIGH (ref 70–99)
Potassium: 4.7 mmol/L (ref 3.5–5.2)
Sodium: 140 mmol/L (ref 134–144)
eGFR: 62 mL/min/{1.73_m2} (ref >59)

## 2021-07-31 NOTE — Progress Notes (Signed)
BMP normal, potassium normal

## 2021-08-10 NOTE — Progress Notes (Signed)
? ?Primary Physician:  Burnard Bunting, MD ? ?Patient ID: [name] [name], female    DOB: 06/15/1950, 71 y.o.   MRN: [id number] ? ?Subjective:  ? ?Chief Complaint  ?Patient presents with  ? paf  ? Hypertension  ?  6 weeks  ? ?HPI: [name] [name]  is a 71 y.o. female  with paroxysmal atypical atrial flutter, hyperlipidemia, morbid obesity and hypertension. She was found to be in atrial flutter with rapid ventricular response on 03/13/2015.  It was felt to be atypical atrial flutter and was evaluated by Dr. [name], recommended cardioversion, successful direct current cardioversion on 04/29/2015 with no recurrence.  02/14/2020 patient presented in atrial fibrillation with rapid ventricular response and underwent successful cardioversion 02/26/2020. She again underwent successful cardioversion from atrial fibrillation to sinus rhythm on 03/11/2020.  ? ?Previously unable to tolerate higher doses of amlodipine due to dizziness and unable to tolerate HCTZ due to worsening renal function. ? ?Patient presents for 6-week follow-up of hypertension.  Last office visit patient's blood pressure was uncontrolled, however she was resistant to medications at that time and instead opted to focus on reducing dietary sodium intake and reevaluate.  Patient has been restricting dietary sodium intake, unfortunately her weight has trended up an additional 3 pounds since last office visit.  Blood pressure is elevated in the office today, however review of previously submitted readings through MyChart as well as recent home blood pressure readings are averaging 135-145/70s mmHg.  Patient admits a significant amount of stress lately due to recurrent UTIs as well as pain from her back which is chronic.  She continues to tolerate coagulation without bleeding diathesis.  She is otherwise asymptomatic from a cardiovascular standpoint. ? ?Past Medical History:  ?Diagnosis Date  ? Anxiety and depression   ? Aortic arch atherosclerosis ([place])  07/13/2018  ? Exogenous obesity   ? Fibromyalgia   ? Hyperlipidemia   ? Hypothyroidism   ? Morbid obesity ([place])   ? OSA (obstructive sleep apnea)   ? uses CPAP  ? Palpitations 07/13/2018  ? Paroxysmal atrial flutter ([place])   ? Periodic limb movement disorder   ? REM sleep behavior disorder   ? Rhinitis   ? SOB (shortness of breath)   ? Spinal stenosis   ? Unspecified hypothyroidism   ? ?Family History  ?Adopted: Yes  ?Problem Relation Age of Onset  ? Cervical cancer Mother   ? ?Past Surgical History:  ?Procedure Laterality Date  ? CARDIOVERSION N/A 04/29/2015  ? Procedure: CARDIOVERSION;  Surgeon: Adrian Prows, MD;  Location: [place];  Service: Cardiovascular;  Laterality: N/A;  ? CARDIOVERSION N/A 02/26/2020  ? Procedure: CARDIOVERSION;  Surgeon: Adrian Prows, MD;  Location: [place];  Service: Cardiovascular;  Laterality: N/A;  ? CARDIOVERSION N/A 03/11/2020  ? Procedure: CARDIOVERSION;  Surgeon: Adrian Prows, MD;  Location: [place];  Service: Cardiovascular;  Laterality: N/A;  ? CARPAL TUNNEL RELEASE    ? Bilateral  ? DILATION AND CURETTAGE OF UTERUS    ? left rotator cuff    ? resection uterine polyp  2011  ? ?Social History  ? ?Tobacco Use  ? Smoking status: Never  ? Smokeless tobacco: Never  ?Substance Use Topics  ? Alcohol use: No  ?  ?Review of Systems  ?Constitutional: Negative for malaise/fatigue.  ?Cardiovascular:  Negative for chest pain, claudication, dyspnea on exertion, leg swelling, near-syncope, orthopnea, palpitations, paroxysmal nocturnal dyspnea and syncope.  ?Musculoskeletal:  Positive for arthritis (left hip) and back pain.  ? ?Objective:  ?  Blood pressure (!) 175/68, pulse (!) 59, temperature (!) 97.5 ?F (36.4 ?C), temperature source Temporal, resp. rate 17, height $RemoveBe'5\' 6"'kCkqGKtxw$  (1.676 m), weight 265 lb (120.2 kg), SpO2 100 %. Body mass index is 42.77 kg/m?.  ? ? ?  08/11/2021  ? 10:03 AM 08/11/2021  ?  9:49 AM 07/01/2021  ? 11:10 AM  ?Vitals with BMI  ?Height  $Remove'5\' 6"'gKRDkCJ$    ?Weight  265 lbs   ?BMI  42.79    ?Systolic 366 440 347  ?Diastolic 68 68 78  ?Pulse 59 61 56  ?  ?Orthostatic VS for the past 72 hrs (Last 3 readings): ? Patient Position BP Location Cuff Size  ?08/11/21 1003 Sitting Left Arm Large  ?08/11/21 0949 Sitting Left Arm Large  ? ? ?Physical Exam ?Vitals reviewed.  ?Constitutional:   ?   Appearance: She is obese.  ?Neck:  ?   Comments: Short neck and difficult to evaluate JVP ?Cardiovascular:  ?   Rate and Rhythm: Normal rate and regular rhythm.  ?   Pulses: Intact distal pulses.  ?   Heart sounds: Normal heart sounds, S1 normal and S2 normal. No murmur heard. ?  No gallop.  ?Pulmonary:  ?   Effort: Pulmonary effort is normal. No respiratory distress.  ?   Breath sounds: Normal breath sounds. No wheezing, rhonchi or rales.  ?Abdominal:  ?   Comments: Obese  ?Musculoskeletal:  ?   Right lower leg: No edema.  ?   Left lower leg: No edema.  ?Neurological:  ?   General: No focal deficit present.  ?   Mental Status: She is alert and oriented to person, place, and time. Mental status is at baseline.  ?   Cranial Nerves: No cranial nerve deficit.  ? ?Laboratory examination:  ? ? ?  Latest Ref Rng & Units 07/30/2021  ? 12:41 PM 05/28/2020  ? 11:27 AM 03/11/2020  ? 10:18 AM  ?CMP  ?Glucose 70 - 99 mg/dL 105   86   101    ?BUN 8 - 27 mg/dL $Remove'16   20   17    'QxTVriw$ ?Creatinine 0.57 - 1.00 mg/dL 0.98   1.16   1.10    ?Sodium 134 - 144 mmol/L 140   139   143    ?Potassium 3.5 - 5.2 mmol/L 4.7   4.3   4.1    ?Chloride 96 - 106 mmol/L 102   100   106    ?CO2 20 - 29 mmol/L 26   26     ?Calcium 8.7 - 10.3 mg/dL 9.7   9.0     ? ? ?  Latest Ref Rng & Units 03/11/2020  ? 10:18 AM 03/11/2020  ? 10:06 AM 02/14/2020  ?  1:07 PM  ?CBC  ?WBC 3.4 - 10.8 x10E3/uL   10.2    ?Hemoglobin 12.0 - 15.0 g/dL 13.9   13.3   13.4    ?Hematocrit 36.0 - 46.0 % 41.0   39.0   41.2    ?Platelets 150 - 450 x10E3/uL   196    ? ?Lipid Panel  ?   ?Component Value Date/Time  ? CHOL 129 11/14/2018 0830  ? TRIG 83 11/14/2018 0830  ? HDL 49 11/14/2018 0830  ?  [place] 63 11/14/2018 0830  ? ?HEMOGLOBIN A1C ?No results found for: HGBA1C, MPG ?TSH ?No results for input(s): TSH in the last 8760 hours. ? ?External labs: ?03/23/2021: ?BUN 18, creatinine 1.16, GFR 54, potassium 4.4 ?Hgb 13.9 ?  Total cholesterol 130, HDL 53, LDL 66, triglycerides 53 ? ?03/17/2020:  ?BUN 13, creatinine 1.0, EGFR 54.8, sodium 139, potassium 4.6 ?Hemoglobin 12.9, hematocrit 39.7, platelets 185 ?Total cholesterol 108, triglycerides 57, HDL 44, LDL 53 ? ?02/27/2019: ?HDL 46, LDL 63, triglycerides 76, total cholesterol 124 ?BUN 14, creatinine 0.90, TSH 0.84 ? ?11/21/2017: Cholesterol 133, triglycerides 88, HDL 47, LDL 88.  Creatinine 0.94, EGFR 63/72, potassium 4.7, CMP normal.  CBC normal. ? ?Allergies  ? ?Allergies  ?Allergen Reactions  ? Aspirin Other (See Comments)  ? Azithromycin Other (See Comments)  ?  unknown  ? Loratadine Other (See Comments)  ? Macrobid [Nitrofurantoin] Hives  ? Sulfa Antibiotics   ?  Other reaction(s): Unknown  ? Ceftin [Cefuroxime] Itching and Rash  ? Codeine Nausea And Vomiting  ? Morphine Nausea And Vomiting  ?  ?Medications Prior to Visit:  ? ?Outpatient Medications Prior to Visit  ?Medication Sig Dispense Refill  ? acetaminophen (TYLENOL) 500 MG tablet Take 500-1,000 mg by mouth every 6 (six) hours as needed (for pain.).    ? amLODipine (NORVASC) 5 MG tablet TAKE 1 TABLET (5 MG TOTAL) BY MOUTH DAILY. (Patient taking differently: 1/2 tablet) 90 tablet 1  ? Ascorbic Acid (VITAMIN C PO) Take by mouth.    ? Ascorbic Acid (VITAMIN C) 500 MG CAPS Take 500 mg by mouth every evening.     ? Cholecalciferol (VITAMIN D-3) 125 MCG (5000 UT) TABS Take 5,000 mg by mouth daily.    ? Cranberry 1000 MG CAPS Take 1 capsule by mouth daily.    ? estradiol (ESTRACE) 0.1 MG/GM vaginal cream Discard applicator-apply one pea size amount to urethra Monday, Wed and Friday night 42.5 g 12  ? flecainide (TAMBOCOR) 100 MG tablet TAKE 1 TABLET BY MOUTH TWICE A DAY 180 tablet 3  ? levothyroxine  (SYNTHROID) 175 MCG tablet Take 175 mcg by mouth daily.    ? metoprolol tartrate (LOPRESSOR) 25 MG tablet TAKE 1 TABLET BY MOUTH THREE TIMES A DAY 270 tablet 3  ? rivaroxaban (XARELTO) 20 MG TABS tablet Take 1 tablet (2

## 2021-08-11 ENCOUNTER — Encounter: Payer: Self-pay | Admitting: Student

## 2021-08-11 ENCOUNTER — Ambulatory Visit: Payer: PPO | Admitting: Student

## 2021-08-11 VITALS — BP 175/68 | HR 59 | Temp 97.5°F | Resp 17 | Ht 66.0 in | Wt 265.0 lb

## 2021-08-11 DIAGNOSIS — I480 Paroxysmal atrial fibrillation: Secondary | ICD-10-CM | POA: Diagnosis not present

## 2021-08-11 DIAGNOSIS — I10 Essential (primary) hypertension: Secondary | ICD-10-CM | POA: Diagnosis not present

## 2021-08-11 MED ORDER — HYDRALAZINE HCL 25 MG PO TABS: 25.0000 mg | ORAL_TABLET | Freq: Three times a day (TID) | ORAL | 3 refills | Status: DC | PRN

## 2021-08-12 ENCOUNTER — Ambulatory Visit: Payer: PPO | Admitting: Student

## 2021-09-10 DIAGNOSIS — I480 Paroxysmal atrial fibrillation: Secondary | ICD-10-CM | POA: Diagnosis not present

## 2021-09-10 DIAGNOSIS — I83891 Varicose veins of right lower extremities with other complications: Secondary | ICD-10-CM | POA: Diagnosis not present

## 2021-09-21 ENCOUNTER — Telehealth: Payer: Self-pay

## 2021-09-21 NOTE — Telephone Encounter (Signed)
It is fine for her to take in prep for the colonoscopy

## 2021-09-21 NOTE — Telephone Encounter (Signed)
Called and spoke to pt, pt voiced understanding.

## 2021-09-21 NOTE — Telephone Encounter (Signed)
Pt called and stated that she is having a colonoscopy done 10/21/2021. She is unable to take the liquid medicine so they gave her sutab tablets to clear her bowels. Pt wants to know if this will interact with any medications. Please advise.

## 2021-09-23 DIAGNOSIS — I83891 Varicose veins of right lower extremities with other complications: Secondary | ICD-10-CM | POA: Diagnosis not present

## 2021-09-23 DIAGNOSIS — R600 Localized edema: Secondary | ICD-10-CM | POA: Diagnosis not present

## 2021-09-23 DIAGNOSIS — I83812 Varicose veins of left lower extremities with pain: Secondary | ICD-10-CM | POA: Diagnosis not present

## 2021-10-01 DIAGNOSIS — I83893 Varicose veins of bilateral lower extremities with other complications: Secondary | ICD-10-CM | POA: Diagnosis not present

## 2021-10-05 DIAGNOSIS — I872 Venous insufficiency (chronic) (peripheral): Secondary | ICD-10-CM | POA: Diagnosis not present

## 2021-10-05 DIAGNOSIS — I480 Paroxysmal atrial fibrillation: Secondary | ICD-10-CM | POA: Diagnosis not present

## 2021-10-05 DIAGNOSIS — G4733 Obstructive sleep apnea (adult) (pediatric): Secondary | ICD-10-CM | POA: Diagnosis not present

## 2021-10-05 DIAGNOSIS — E039 Hypothyroidism, unspecified: Secondary | ICD-10-CM | POA: Diagnosis not present

## 2021-10-05 DIAGNOSIS — M4697 Unspecified inflammatory spondylopathy, lumbosacral region: Secondary | ICD-10-CM | POA: Diagnosis not present

## 2021-10-05 DIAGNOSIS — E785 Hyperlipidemia, unspecified: Secondary | ICD-10-CM | POA: Diagnosis not present

## 2021-10-13 DIAGNOSIS — R600 Localized edema: Secondary | ICD-10-CM | POA: Diagnosis not present

## 2021-10-13 DIAGNOSIS — I83891 Varicose veins of right lower extremities with other complications: Secondary | ICD-10-CM | POA: Diagnosis not present

## 2021-10-13 DIAGNOSIS — G2581 Restless legs syndrome: Secondary | ICD-10-CM | POA: Diagnosis not present

## 2021-11-10 ENCOUNTER — Ambulatory Visit: Payer: PPO | Admitting: Urology

## 2021-11-10 DIAGNOSIS — N390 Urinary tract infection, site not specified: Secondary | ICD-10-CM | POA: Diagnosis not present

## 2021-11-10 MED ORDER — ESTRADIOL 0.1 MG/GM VA CREA: TOPICAL_CREAM | VAGINAL | 12 refills | Status: DC

## 2021-11-10 NOTE — Progress Notes (Signed)
11/10/21 11:14 AM   [name] [name] 08/14/1950 [id number]  Referring provider:  Geoffry Paradise, MD [address] [place],  [place] [address] Chief Complaint  Patient presents with   Recurrent UTI      HPI: [name] [name] is a 71 y.o.female with a personal history of recurrent UTI, atrophic vaginitis, pelvic floor prolapse, mixed urinary incontinence, and stage II/III cystocele, who presents today for a follow-up.   Her recurrent UTIs are managed with topical estrogen cream, cranberry supplements, daily probiotics, and suppressiv trimethoprim x6 months.   She underwent last seen for a cystoscopy on 1//01/2022 that was reassuring.   She reports burning with urination today that started on the 11/05/2021. She is bothered by her pelvic floor prolapse, mentions today that she was told by her OB/GYN that this is mild. She is still using her topical estrogen cream. She last took trimethoprim yesterday.   She has been trying to lose weight but is spinal stenosis and cannot walk.  She has a lower extremity issue that is keeping her from pool exercise and is awaiting to see a vein specialist for this.  She is cut back on calories.  No UTIs over the last 6 months.  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   Exogenous obesity    Fibromyalgia    Hyperlipidemia    Hypothyroidism    Morbid obesity (HCC)    OSA (obstructive sleep apnea)    uses CPAP   Palpitations 07/13/2018   Paroxysmal atrial flutter (HCC)    Periodic limb movement disorder    REM sleep behavior disorder    Rhinitis    SOB (shortness of breath)    Spinal stenosis    Unspecified hypothyroidism     Surgical History: Past Surgical History:  Procedure Laterality Date   CARDIOVERSION N/A 04/29/2015   Procedure: CARDIOVERSION;  Surgeon: Yates Decamp, MD;  Location: [facility] ENDOSCOPY;  Service: Cardiovascular;  Laterality: N/A;   CARDIOVERSION N/A  02/26/2020   Procedure: CARDIOVERSION;  Surgeon: Yates Decamp, MD;  Location: [facility] ENDOSCOPY;  Service: Cardiovascular;  Laterality: N/A;   CARDIOVERSION N/A 03/11/2020   Procedure: CARDIOVERSION;  Surgeon: Yates Decamp, MD;  Location: [facility] ENDOSCOPY;  Service: Cardiovascular;  Laterality: N/A;   CARPAL TUNNEL RELEASE     Bilateral   DILATION AND CURETTAGE OF UTERUS     left rotator cuff     resection uterine polyp  2011    Home Medications:  Allergies as of 11/10/2021       Reactions   Aspirin Other (See Comments)   Azithromycin Other (See Comments)   unknown   Loratadine Other (See Comments)   Macrobid [nitrofurantoin] Hives   Sulfa Antibiotics    Other reaction(s): Unknown   Ceftin [cefuroxime] Itching, Rash   Codeine Nausea And Vomiting   Morphine Nausea And Vomiting        Medication List        Accurate as of November 10, 2021 11:14 AM. If you have any questions, ask your nurse or doctor.          STOP taking these medications    hydrALAZINE 25 MG tablet Commonly known as: APRESOLINE   trimethoprim 100 MG tablet Commonly known as: TRIMPEX       TAKE these medications    acetaminophen 500 MG tablet Commonly known as: TYLENOL Take 500-1,000 mg by mouth every 6 (six) hours as needed (for pain.).  amLODipine 5 MG tablet Commonly known as: NORVASC TAKE 1 TABLET (5 MG TOTAL) BY MOUTH DAILY. What changed:  how much to take how to take this when to take this additional instructions   Cranberry 1000 MG Caps Take 1 capsule by mouth daily.   estradiol 0.1 MG/GM vaginal cream Commonly known as: ESTRACE Discard applicator-apply one pea size amount to urethra Monday, Wed and Friday night   flecainide 100 MG tablet Commonly known as: TAMBOCOR TAKE 1 TABLET BY MOUTH TWICE A DAY   levothyroxine 175 MCG tablet Commonly known as: SYNTHROID Take 175 mcg by mouth daily.   metoprolol tartrate 25 MG tablet Commonly known as: LOPRESSOR TAKE 1 TABLET BY MOUTH THREE  TIMES A DAY   rivaroxaban 20 MG Tabs tablet Commonly known as: Xarelto Take 1 tablet (20 mg total) by mouth every evening.   rosuvastatin 10 MG tablet Commonly known as: CRESTOR TAKE ONE HALF TABLET BY MOUTH ONCE A DAY.   Sutab [phone] MG Tabs Generic drug: Sodium Sulfate-Mag Sulfate-KCl Take by mouth as directed.   VITAMIN B COMPLEX-C PO Take 1 capsule by mouth daily.   Vitamin C 500 MG Caps Take 500 mg by mouth every evening.   VITAMIN C PO Take by mouth.   Vitamin D-3 125 MCG (5000 UT) Tabs Take 5,000 mg by mouth daily.        Allergies:  Allergies  Allergen Reactions   Aspirin Other (See Comments)   Azithromycin Other (See Comments)    unknown   Loratadine Other (See Comments)   Macrobid [Nitrofurantoin] Hives   Sulfa Antibiotics     Other reaction(s): Unknown   Ceftin [Cefuroxime] Itching and Rash   Codeine Nausea And Vomiting   Morphine Nausea And Vomiting    Family History: Family History  Adopted: Yes  Problem Relation Age of Onset   Cervical cancer Mother     Social History:  reports that she has never smoked. She has never used smokeless tobacco. She reports that she does not drink alcohol and does not use drugs.   Physical Exam: Constitutional:  Alert and oriented, No acute distress. HEENT: [place] AT, moist mucus membranes.  Trachea midline, no masses. Cardiovascular: No clubbing, cyanosis, or edema. Respiratory: Normal respiratory effort, no increased work of breathing. Skin: No rashes, bruises or suspicious lesions. Neurologic: Grossly intact, no focal deficits, moving all 4 extremities. Psychiatric: Normal mood and affect.  Laboratory Data:  Lab Results  Component Value Date   CREATININE 0.98 07/30/2021   Urinalysis Unable to void  Assessment & Plan:   Recurrent UTIs -Is having some very mild dysuria for the past several days, but is unable to leave [place] a sample today -Return to provide sample if symptoms fail to improve -  Will have her stop taking trimethoprim, viral stewardship reviewed in detail and she is agreeable this plan - Continue vaginal estrogen cream; refill sent  -  We reviewed UTI prevention supplements such as cranberry tablets, probiotics, yogurt that has active lactobacillus culture, and d-mannose   Follow-up annually or sooner as needed  Tawni Millers as a scribe for Vanna Scotland, MD.,have documented all relevant documentation on the behalf of Vanna Scotland, MD,as directed by  Vanna Scotland, MD while in the presence of Vanna Scotland, MD.  I have reviewed the above documentation for accuracy and completeness, and I agree with the above.   Vanna Scotland, MD  [facility] [address], [address] [place], [place] [address] [phone]

## 2021-11-30 DIAGNOSIS — Z0289 Encounter for other administrative examinations: Secondary | ICD-10-CM

## 2021-12-02 ENCOUNTER — Other Ambulatory Visit: Payer: Self-pay | Admitting: Student

## 2021-12-03 DIAGNOSIS — Z1231 Encounter for screening mammogram for malignant neoplasm of breast: Secondary | ICD-10-CM | POA: Diagnosis not present

## 2021-12-03 DIAGNOSIS — N390 Urinary tract infection, site not specified: Secondary | ICD-10-CM | POA: Diagnosis not present

## 2021-12-03 DIAGNOSIS — Z01419 Encounter for gynecological examination (general) (routine) without abnormal findings: Secondary | ICD-10-CM | POA: Diagnosis not present

## 2021-12-03 DIAGNOSIS — Z6841 Body Mass Index (BMI) 40.0 and over, adult: Secondary | ICD-10-CM | POA: Diagnosis not present

## 2021-12-08 DIAGNOSIS — N8182 Incompetence or weakening of pubocervical tissue: Secondary | ICD-10-CM | POA: Diagnosis not present

## 2021-12-15 ENCOUNTER — Ambulatory Visit (INDEPENDENT_AMBULATORY_CARE_PROVIDER_SITE_OTHER): Payer: PPO | Admitting: Family Medicine

## 2021-12-15 ENCOUNTER — Encounter (INDEPENDENT_AMBULATORY_CARE_PROVIDER_SITE_OTHER): Payer: Self-pay | Admitting: Family Medicine

## 2021-12-15 VITALS — BP 141/75 | HR 59 | Temp 98.0°F | Ht 64.0 in | Wt 261.0 lb

## 2021-12-15 DIAGNOSIS — I10 Essential (primary) hypertension: Secondary | ICD-10-CM

## 2021-12-15 DIAGNOSIS — I484 Atypical atrial flutter: Secondary | ICD-10-CM | POA: Diagnosis not present

## 2021-12-15 DIAGNOSIS — Z6841 Body Mass Index (BMI) 40.0 and over, adult: Secondary | ICD-10-CM

## 2021-12-15 DIAGNOSIS — E669 Obesity, unspecified: Secondary | ICD-10-CM

## 2021-12-15 DIAGNOSIS — M4800 Spinal stenosis, site unspecified: Secondary | ICD-10-CM | POA: Diagnosis not present

## 2021-12-15 DIAGNOSIS — E7849 Other hyperlipidemia: Secondary | ICD-10-CM

## 2021-12-15 DIAGNOSIS — R0602 Shortness of breath: Secondary | ICD-10-CM | POA: Diagnosis not present

## 2021-12-15 DIAGNOSIS — Z1331 Encounter for screening for depression: Secondary | ICD-10-CM

## 2021-12-15 DIAGNOSIS — R5383 Other fatigue: Secondary | ICD-10-CM | POA: Diagnosis not present

## 2021-12-16 DIAGNOSIS — N8182 Incompetence or weakening of pubocervical tissue: Secondary | ICD-10-CM | POA: Diagnosis not present

## 2021-12-21 LAB — VITAMIN D, 25-HYDROXY, TOTAL: Vitamin D, 25-Hydroxy, Serum: 49 ng/mL

## 2021-12-21 LAB — VITAMIN B12: Vitamin B-12: 476 pg/mL (ref 232–1245)

## 2021-12-21 LAB — HEMOGLOBIN A1C
Est. average glucose Bld gHb Est-mCnc: 117 mg/dL
Hgb A1c MFr Bld: 5.7 % — ABNORMAL HIGH (ref 4.8–5.6)

## 2021-12-21 LAB — INSULIN, RANDOM: INSULIN: 9 u[IU]/mL (ref 2.6–24.9)

## 2021-12-21 LAB — TSH RFX ON ABNORMAL TO FREE T4: TSH: 2.75 u[IU]/mL (ref 0.450–4.500)

## 2021-12-23 NOTE — Progress Notes (Signed)
Chief Complaint:   [name] [name] (MR# [id number]) is a 71 y.o. female who presents for evaluation and treatment of [name] and related comorbidities. Current BMI is Body mass index is 44.8 kg/m. [name] [name] has been struggling with her weight for many years and has been unsuccessful in either losing weight, maintaining weight loss, or reaching her healthy weight goal.  [name] [name] is currently in the action stage of change and ready to dedicate time achieving and maintaining a healthier weight. [name] [name] is interested in becoming our patient and working on intensive lifestyle modifications including (but not limited to) diet and exercise for weight loss.  [name] [name] would like 80 lb weight loss. She is close to her max weight. She has been overweight since childhood. She is adopted. [name] [name]'s adoptive mom is overweight. [name] [name]'s adult weight has fluctuated approximately 10 lb up and down.   [name] [name]'s habits were reviewed today and are as follows: Her family eats meals together, she thinks her family will eat healthier with her, her desired weight loss is 81 lbs, she has been heavy most of her life, her heaviest weight ever was 267 pounds, she has significant food cravings issues, she snacks frequently in the evenings, she skips meals frequently, she is frequently drinking liquids with calories, she frequently eats larger portions than normal, she has binge eating behaviors, and she struggles with emotional eating.  Depression Screen [name] [name]'s Food and Mood (modified PHQ-9) score was 7.     12/15/2021    8:46 AM  Depression screen PHQ 2/9  Decreased Interest 2  Down, Depressed, Hopeless 2  PHQ - 2 Score 4  Altered sleeping 0  Tired, decreased energy 3  Change in appetite 0  Feeling bad or failure about yourself  0  Trouble concentrating 0  Moving slowly or fidgety/restless 0  Suicidal thoughts 0  PHQ-9 Score 7  Difficult doing work/chores Not difficult at all   Subjective:   1.  Other fatigue [name] [name] admits to daytime somnolence and denies waking up still tired. Patient has a history of symptoms of daytime fatigue. [name] [name] generally gets 9 hours of sleep per night, and states that she has generally restful sleep. Snoring is not present. Apneic episodes are not present. Epworth Sleepiness Score is 4. Symptoms likely due to inactivity/deconditioning.  Expected BMR 1697 and actual BMR 1411 = less than expected.  2. SOB (shortness of breath) [name] [name] notes increasing shortness of breath with exercising and seems to be worsening over time with weight gain. She notes getting out of breath sooner with activity than she used to. This has gotten worse recently. [name] [name] denies shortness of breath at rest or orthopnea. Symptoms likely due to inactivity/deconditioning.  3. Atypical atrial flutter (HCC) [name] [name] is on flecainide 100 mg BID, lopressor 25 mg TID, and Xarelto 20 mg QD. She denies palpitations or dyspnea on exertion.  4. Other hyperlipidemia She takes Crestor 10 mg, 1/2 tab daily and is doing well wit it.  5. Spinal stenosis, unspecified spinal region [name] [name] reports back pain has limited her walking. She takes Tylenol as needed for pain. [name] [name] is looking for a Pilates class or water exercise options.  6. Benign essential HTN BP slightly elevated today. [name] [name]'s medication regimen is Norvasc 5 mg and Toprol daily.  Assessment/Plan:   1. Other fatigue [name] [name] does feel that her weight is causing her energy to be lower than it should be. Fatigue may be related to [name], depression or many other causes. Labs will  be ordered, and in the meanwhile, Shaniquia will focus on self care including making healthy food choices, increasing physical activity and focusing on stress reduction.  - EKG 12-Lead  Lab/Orders today: - Vitamin B12 - Vitamin D, 25-Hydroxy, Total - TSH Rfx on Abnormal to Free T4 - Hemoglobin A1c - Insulin, random  2. SOB (shortness of breath) [name] [name] does  feel that she gets out of breath more easily that she used to when she exercises. [name] [name]'s shortness of breath appears to be [name] related and exercise induced. She has agreed to work on weight loss and gradually increase exercise to treat her exercise induced shortness of breath. Will continue to monitor closely.  3. Atypical atrial flutter (HCC) Continue current meds per cardiology. EKG reviewed and is unchanged from previous.  4. Other hyperlipidemia Cardiovascular risk and specific lipid/LDL goals reviewed.  We discussed several lifestyle modifications today and [name] [name] will continue to work on diet, exercise and weight loss efforts. Orders and follow up as documented in patient record. Fasting lipid panel will updated at her annual wellness exam in November.  Counseling Intensive lifestyle modifications are the first line treatment for this issue. Dietary changes: Increase soluble fiber. Decrease simple carbohydrates. Exercise changes: Moderate to vigorous-intensity aerobic activity 150 minutes per week if tolerated. Lipid-lowering medications: see documented in medical record.  5. Spinal stenosis, unspecified spinal region We discussed water aerobics exercises at [facility].  6. Benign essential HTN [name] [name] is working on healthy weight loss and exercise to improve blood pressure control. We will watch for signs of hypotension as she continues her lifestyle modifications. Continue current treatment plan per PCP. Look for BP improvements with weight loss.  7. Depression screening [name] [name] had a positive depression screening. Depression is commonly associated with [name] and often results in emotional eating behaviors. We will monitor this closely and work on CBT to help improve the non-hunger eating patterns. Referral to Psychology may be required if no improvement is seen as she continues in our clinic.  8. [name], current BMI 44.8 [name] [name] is currently in the action stage  of change and her goal is to continue with weight loss efforts. I recommend [name] [name] begin the structured treatment plan as follows:  She has agreed to the Category 1 Plan with 85-90 grams of protein.  Exercise goals:  Check out water exercises/Pilates options.    Behavioral modification strategies: increasing lean protein intake, increasing water intake, decreasing eating out, no skipping meals, and decreasing junk food.  She was informed of the importance of frequent follow-up visits to maximize her success with intensive lifestyle modifications for her multiple health conditions. She was informed we would discuss her lab results at her next visit unless there is a critical issue that needs to be addressed sooner. [name] [name] agreed to keep her next visit at the agreed upon time to discuss these results.  Objective:   Blood pressure (!) 141/75, pulse (!) 59, temperature 98 F (36.7 C), height 5\' 4"  (1.626 m), weight 261 lb (118.4 kg), SpO2 98 %. Body mass index is 44.8 kg/m.  EKG: Normal sinus rhythm, rate 56.  Indirect Calorimeter completed today shows a VO2 of 205 and a REE of 1411.  Her calculated basal metabolic rate is thus her basal metabolic rate is worse than expected.  General: Cooperative, alert, well developed, in no acute distress. HEENT: Conjunctivae and lids unremarkable. Cardiovascular: Regular rhythm.  Lungs: Normal work of breathing. Neurologic: No focal deficits.   Lab Results  Component Value  Date   CREATININE 0.98 07/30/2021   BUN 16 07/30/2021   NA 140 07/30/2021   K 4.7 07/30/2021   CL 102 07/30/2021   CO2 26 07/30/2021   Lab Results  Component Value Date   ALT 12 11/14/2018   AST 17 11/14/2018   ALKPHOS 47 11/14/2018   BILITOT 0.3 11/14/2018   Lab Results  Component Value Date   HGBA1C 5.7 (H) 12/15/2021   Lab Results  Component Value Date   INSULIN 9.0 12/15/2021   Lab Results  Component Value Date   TSH 2.750 12/15/2021   Lab Results   Component Value Date   CHOL 129 11/14/2018   HDL 49 11/14/2018   LDLCALC 63 11/14/2018   TRIG 83 11/14/2018   Lab Results  Component Value Date   WBC 10.2 02/14/2020   HGB 13.9 03/11/2020   HCT 41.0 03/11/2020   MCV 91 02/14/2020   PLT 196 02/14/2020   No results found for: "IRON", "TIBC", "FERRITIN" [name] Behavioral Intervention:   Approximately 15 minutes were spent on the discussion below.  ASK: We discussed the diagnosis of [name] with [name] [name] today and [name] [name] agreed to give [place] permission to discuss [name] behavioral modification therapy today.  ASSESS: Syvilla has the diagnosis of [name] and her BMI today is 44.8. Megha is in the action stage of change.   ADVISE: Deeanne was educated on the multiple health risks of [name] as well as the benefit of weight loss to improve her health. She was advised of the need for long term treatment and the importance of lifestyle modifications to improve her current health and to decrease her risk of future health problems.  AGREE: Multiple dietary modification options and treatment options were discussed and Jessey agreed to follow the recommendations documented in the above note.  ARRANGE: Ellysa was educated on the importance of frequent visits to treat [name] as outlined per CMS and USPSTF guidelines and agreed to schedule her next follow up appointment today.  Attestation Statements:   Reviewed by clinician on day of visit: allergies, medications, problem list, medical history, surgical history, family history, social history, and previous encounter notes.  I, Kyung Rudd, BS, CMA, am acting as transcriptionist for [employer], DO.  I have reviewed the above documentation for accuracy and completeness, and I agree with the above. Glennis Brink, DO

## 2021-12-29 ENCOUNTER — Ambulatory Visit (INDEPENDENT_AMBULATORY_CARE_PROVIDER_SITE_OTHER): Payer: PPO | Admitting: Family Medicine

## 2021-12-29 ENCOUNTER — Encounter (INDEPENDENT_AMBULATORY_CARE_PROVIDER_SITE_OTHER): Payer: Self-pay | Admitting: Family Medicine

## 2021-12-29 VITALS — BP 114/62 | HR 56 | Temp 97.8°F | Ht 64.0 in | Wt 256.0 lb

## 2021-12-29 DIAGNOSIS — M4800 Spinal stenosis, site unspecified: Secondary | ICD-10-CM | POA: Diagnosis not present

## 2021-12-29 DIAGNOSIS — Z6841 Body Mass Index (BMI) 40.0 and over, adult: Secondary | ICD-10-CM

## 2021-12-29 DIAGNOSIS — M1712 Unilateral primary osteoarthritis, left knee: Secondary | ICD-10-CM

## 2021-12-29 DIAGNOSIS — R7303 Prediabetes: Secondary | ICD-10-CM | POA: Diagnosis not present

## 2021-12-29 DIAGNOSIS — E669 Obesity, unspecified: Secondary | ICD-10-CM | POA: Diagnosis not present

## 2021-12-29 DIAGNOSIS — I4892 Unspecified atrial flutter: Secondary | ICD-10-CM | POA: Diagnosis not present

## 2022-01-11 NOTE — Progress Notes (Unsigned)
Chief Complaint:   OBESITY [name] [name] is here to discuss her progress with her obesity treatment plan along with follow-up of her obesity related diagnoses. [name] [name] is on the Category 1 Plan with 85-90 grams of protein daily and states she is following her eating plan approximately 100% of the time. [name] [name] states she is doing 0 minutes 0 times per week.  Today's visit was #: 2 Starting weight: 261 lbs Starting date: 12/15/2021 Today's weight: 256 lbs Today's date: 12/29/2021 Total lbs lost to date: 5 Total lbs lost since last in-office visit: 5  Interim History: [name] [name] is doing well on her meal plan.  She misses pizza.  She denies hunger or cravings.  Avoid sugar sweetened beverages.  Received a pessary which has helped with urinary symptoms. Plans to walk more but hip and knee pain limits her.    Subjective:   1. Prediabetes [name] [name]'s last A1c was 5.7 on 12/15/2021.  She has cut down on her intake of sweets and denies cravings.  I discussed labs with the patient today.  2. Atrial flutter, unspecified type (HCC) [name] [name] is on Flecainide, metoprolol, and Xarelto per Cardiology, stable. She denies palpitations or chest pain.  3. Spinal stenosis, unspecified spinal region [name] [name] notes her diagnoses limits exercise.  Considering water exercise.  4. Osteoarthritis of left knee, unspecified osteoarthritis type [name] [name] is seeing Dr. Rayburn Ma, orthopedics.  Left hip and knee pain limits walking, and she is scheduled for injections.  Assessment/Plan:   1. Prediabetes We will recheck labs in 3 to 4 months.  Priseis will work on weight loss and increasing walking time.  2. Atrial flutter, unspecified type (HCC) [name] [name] is to avoid the use of stimulants for weight loss.  She will continue her current medications per Cardiology.  3. Spinal stenosis, unspecified spinal region [name] [name] will exercise as tolerated.  4. Osteoarthritis of left knee, unspecified osteoarthritis type [name] [name] for  improvement in hip and knee pain with weight loss.  5. Obesity, current BMI 44..0 [name] [name] is currently in the action stage of change. As such, her goal is to continue with weight loss efforts. She has agreed to the Category 1 Plan with 85 grams of protein daily.   [name] [name] reviewed [name] [name] nutrition facts online so she can make smarter choices.  Exercise goals: All adults should avoid inactivity. Some physical activity is better than none, and adults who participate in any amount of physical activity gain some health benefits.  Behavioral modification strategies: increasing lean protein intake, increasing vegetables, increasing water intake, decreasing eating out, no skipping meals, better snacking choices, travel eating strategies, and decreasing junk food.  [name] [name] has agreed to follow-up with our clinic in 3 weeks. She was informed of the importance of frequent follow-up visits to maximize her success with intensive lifestyle modifications for her multiple health conditions.   Objective:   Blood pressure 114/62, pulse (!) 56, temperature 97.8 F (36.6 C), height 5\' 4"  (1.626 m), weight 256 lb (116.1 kg), SpO2 99 %. Body mass index is 43.94 kg/m.  General: Cooperative, alert, well developed, in no acute distress. HEENT: Conjunctivae and lids unremarkable. Cardiovascular: Regular rhythm.  Lungs: Normal work of breathing. Neurologic: No focal deficits.   Lab Results  Component Value Date   CREATININE 0.98 07/30/2021   BUN 16 07/30/2021   NA 140 07/30/2021   K 4.7 07/30/2021   CL 102 07/30/2021   CO2 26 07/30/2021   Lab Results  Component Value Date   ALT 12 11/14/2018  AST 17 11/14/2018   ALKPHOS 47 11/14/2018   BILITOT 0.3 11/14/2018   Lab Results  Component Value Date   HGBA1C 5.7 (H) 12/15/2021   Lab Results  Component Value Date   INSULIN 9.0 12/15/2021   Lab Results  Component Value Date   TSH 2.750 12/15/2021   Lab Results  Component Value Date   CHOL  129 11/14/2018   HDL 49 11/14/2018   LDLCALC 63 11/14/2018   TRIG 83 11/14/2018   No results found for: "VD25OH" Lab Results  Component Value Date   WBC 10.2 02/14/2020   HGB 13.9 03/11/2020   HCT 41.0 03/11/2020   MCV 91 02/14/2020   PLT 196 02/14/2020   No results found for: "IRON", "TIBC", "FERRITIN"  Obesity Behavioral Intervention:   Approximately 15 minutes were spent on the discussion below.  ASK: We discussed the diagnosis of obesity with [name] [name] today and Laniah agreed to give [place] permission to discuss obesity behavioral modification therapy today.  ASSESS: Madelin has the diagnosis of obesity and her BMI today is 44.0. Kemya is in the action stage of change.   ADVISE: Naketa was educated on the multiple health risks of obesity as well as the benefit of weight loss to improve her health. She was advised of the need for long term treatment and the importance of lifestyle modifications to improve her current health and to decrease her risk of future health problems.  AGREE: Multiple dietary modification options and treatment options were discussed and Ladena agreed to follow the recommendations documented in the above note.  ARRANGE: Adiya was educated on the importance of frequent visits to treat obesity as outlined per CMS and USPSTF guidelines and agreed to schedule her next follow up appointment today.  Attestation Statements:   Reviewed by clinician on day of visit: allergies, medications, problem list, medical history, surgical history, family history, social history, and previous encounter notes.   Trude Mcburney, am acting as transcriptionist for Seymour Bars, DO.  I have reviewed the above documentation for accuracy and completeness, and I agree with the above. Glennis Brink, DO

## 2022-01-26 DIAGNOSIS — I872 Venous insufficiency (chronic) (peripheral): Secondary | ICD-10-CM | POA: Diagnosis not present

## 2022-01-28 ENCOUNTER — Ambulatory Visit (INDEPENDENT_AMBULATORY_CARE_PROVIDER_SITE_OTHER): Payer: PPO | Admitting: Family Medicine

## 2022-01-28 ENCOUNTER — Encounter (INDEPENDENT_AMBULATORY_CARE_PROVIDER_SITE_OTHER): Payer: Self-pay | Admitting: Family Medicine

## 2022-01-28 VITALS — BP 137/80 | HR 56 | Temp 97.7°F | Ht 64.0 in | Wt 254.0 lb

## 2022-01-28 DIAGNOSIS — R7303 Prediabetes: Secondary | ICD-10-CM | POA: Diagnosis not present

## 2022-01-28 DIAGNOSIS — N8110 Cystocele, unspecified: Secondary | ICD-10-CM

## 2022-01-28 DIAGNOSIS — Z6841 Body Mass Index (BMI) 40.0 and over, adult: Secondary | ICD-10-CM | POA: Diagnosis not present

## 2022-01-28 DIAGNOSIS — E669 Obesity, unspecified: Secondary | ICD-10-CM | POA: Diagnosis not present

## 2022-01-28 DIAGNOSIS — M4800 Spinal stenosis, site unspecified: Secondary | ICD-10-CM

## 2022-01-29 DIAGNOSIS — I83811 Varicose veins of right lower extremities with pain: Secondary | ICD-10-CM | POA: Diagnosis not present

## 2022-01-29 DIAGNOSIS — Z09 Encounter for follow-up examination after completed treatment for conditions other than malignant neoplasm: Secondary | ICD-10-CM | POA: Diagnosis not present

## 2022-02-01 NOTE — Progress Notes (Unsigned)
Chief Complaint:   OBESITY [name] [name] is here to discuss her progress with her obesity treatment plan along with follow-up of her obesity related diagnoses. [name] [name] is on the Category 1 Plan and states she is following her eating plan approximately 85% of the time. [name] [name] states she is not exercising.  Today's visit was #: 3 Starting weight: 261 lbs Starting date: 12/15/2021 Today's weight: 254 lbs Today's date: 01/28/2022 Total lbs lost to date: 7 lbs Total lbs lost since last in-office visit: 2 lbs   Interim History: She has been eating on plan, getting in fruits and veggies.  Has not been skipping meals.  She has been working on getting protein with meals and increasing water intake.  Her energy level is low with exercise.  She is healing from vein surgery.  Exercise is limited due to spinal stenosis.   Subjective:   1. 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  Pessary placed 12/16/21 by GYN improving, but some prolapse.   2. Pre-diabetes A1c 5.7.  She is actively working on reducing sugar intake.   3. Spinal stenosis, unspecified spinal region Limits walking, a barrier to further weight loss.   Assessment/Plan:   1. 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   2. Pre-diabetes   3. Spinal stenosis, unspecified spinal region Plans to get back into water aerobics once healed from vein surgery.   4. Obesity, current BMI 43.6 Discussed future use of [place] for water exercise.  [name] is currently in the action stage of change. As such, her goal is to continue with weight loss efforts. She has agreed to the Category 1 Plan.   Exercise goals:  Plans to start water exercises after veins heal.   Behavioral modification strategies: increasing lean protein intake, increasing vegetables, increasing water intake, decreasing eating out, no skipping meals, meal planning and cooking  strategies, keeping healthy foods in the home, and decreasing junk food.  [name] has agreed to follow-up with our clinic in 4 weeks. She was informed of the importance of frequent follow-up visits to maximize her success with intensive lifestyle modifications for her multiple health conditions.   Objective:   Blood pressure 137/80, pulse (!) 56, temperature 97.7 F (36.5 C), height 5\' 4"  (1.626 m), weight 254 lb (115.2 kg), SpO2 99 %. Body mass index is 43.6 kg/m.  General: Cooperative, alert, well developed, in no acute distress. HEENT: Conjunctivae and lids unremarkable. Cardiovascular: Regular rhythm.  Lungs: Normal work of breathing. Neurologic: No focal deficits.   Lab Results  Component Value Date   CREATININE 0.98 07/30/2021   BUN 16 07/30/2021   NA 140 07/30/2021   K 4.7 07/30/2021   CL 102 07/30/2021   CO2 26 07/30/2021   Lab Results  Component Value Date   ALT 12 11/14/2018   AST 17 11/14/2018   ALKPHOS 47 11/14/2018   BILITOT 0.3 11/14/2018   Lab Results  Component Value Date   HGBA1C 5.7 (H) 12/15/2021   Lab Results  Component Value Date   INSULIN 9.0 12/15/2021   Lab Results  Component Value Date   TSH 2.750 12/15/2021   Lab Results  Component Value Date   CHOL 129 11/14/2018   HDL 49 11/14/2018   LDLCALC 63 11/14/2018   TRIG 83 11/14/2018   No results found for: "VD25OH" Lab Results  Component Value Date   WBC 10.2 02/14/2020   HGB 13.9 03/11/2020  HCT 41.0 03/11/2020   MCV 91 02/14/2020   PLT 196 02/14/2020   No results found for: "IRON", "TIBC", "FERRITIN"  Obesity Behavioral Intervention:   Approximately 15 minutes were spent on the discussion below.  ASK: We discussed the diagnosis of obesity with Neoma Laming today and Catiana agreed to give [place] permission to discuss obesity behavioral modification therapy today.  ASSESS: [name] has the diagnosis of obesity and her BMI today is 43.6. [name] is in the action stage of change.    ADVISE: [name] was educated on the multiple health risks of obesity as well as the benefit of weight loss to improve her health. She was advised of the need for long term treatment and the importance of lifestyle modifications to improve her current health and to decrease her risk of future health problems.  AGREE: Multiple dietary modification options and treatment options were discussed and [name] agreed to follow the recommendations documented in the above note.  ARRANGE: [name] was educated on the importance of frequent visits to treat obesity as outlined per CMS and USPSTF guidelines and agreed to schedule her next follow up appointment today.  Attestation Statements:   Reviewed by clinician on day of visit: allergies, medications, problem list, medical history, surgical history, family history, social history, and previous encounter notes.  I, Davy Pique, am acting as Location manager for Loyal Gambler, DO.  I have reviewed the above documentation for accuracy and completeness, and I agree with the above. Dell Ponto, DO

## 2022-02-04 DIAGNOSIS — N8182 Incompetence or weakening of pubocervical tissue: Secondary | ICD-10-CM | POA: Diagnosis not present

## 2022-02-09 DIAGNOSIS — N819 Female genital prolapse, unspecified: Secondary | ICD-10-CM | POA: Diagnosis not present

## 2022-02-10 ENCOUNTER — Ambulatory Visit: Payer: PPO | Admitting: Student

## 2022-02-11 DIAGNOSIS — I872 Venous insufficiency (chronic) (peripheral): Secondary | ICD-10-CM | POA: Diagnosis not present

## 2022-02-15 ENCOUNTER — Ambulatory Visit: Payer: PPO | Admitting: Internal Medicine

## 2022-02-15 ENCOUNTER — Encounter: Payer: Self-pay | Admitting: Internal Medicine

## 2022-02-15 DIAGNOSIS — I10 Essential (primary) hypertension: Secondary | ICD-10-CM | POA: Insufficient documentation

## 2022-02-15 DIAGNOSIS — I4892 Unspecified atrial flutter: Secondary | ICD-10-CM

## 2022-02-15 DIAGNOSIS — E782 Mixed hyperlipidemia: Secondary | ICD-10-CM

## 2022-02-15 NOTE — Progress Notes (Signed)
Primary Physician:  Burnard Bunting, MD  Patient ID: [name] [name], female    DOB: 07/04/1950, 72 y.o.   MRN: [id number]  Subjective:   No chief complaint on file.  HPI: [name] [name]  is a 71 y.o. female  with paroxysmal atypical atrial flutter, hyperlipidemia, morbid obesity and hypertension. She was found to be in atrial flutter with rapid ventricular response on 03/13/2015.  It was felt to be atypical atrial flutter and was evaluated by Dr. [name], recommended cardioversion, successful direct current cardioversion on 04/29/2015 with no recurrence.  02/14/2020 patient presented in atrial fibrillation with rapid ventricular response and underwent successful cardioversion 02/26/2020. She again underwent successful cardioversion from atrial fibrillation to sinus rhythm on 03/11/2020.   Patient has been doing well since last visit. She has had one episode of feeling light-headed which could be due to uncontrolled blood pressure. Patient has not taken her PRN hydralazine at all but given how high her pressure is she should take it three times daily every day. Patient is agreeable to this. She will keep a BP log and call us with it. She is starting with the weight loss clinic soon and looking forward to walking more again. Denies chest pain, shortness of breath, palpitations, diaphoresis, syncope, edema, PND, orthopnea.   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   Exogenous obesity    Fibromyalgia    Hyperlipidemia    Hypothyroidism    Morbid obesity (HCC)    OSA (obstructive sleep apnea)    uses CPAP   Palpitations 07/13/2018   Paroxysmal atrial flutter (HCC)    Periodic limb movement disorder    REM sleep behavior disorder    Rhinitis    SOB (shortness of breath)    Spinal stenosis    Unspecified hypothyroidism    Family History  Adopted: Yes  Problem Relation Age of Onset   Cervical cancer Mother     Past Surgical History:  Procedure Laterality Date   CARDIOVERSION N/A 04/29/2015   Procedure: CARDIOVERSION;  Surgeon: Adrian Prows, MD;  Location: [facility] ENDOSCOPY;  Service: Cardiovascular;  Laterality: N/A;   CARDIOVERSION N/A 02/26/2020   Procedure: CARDIOVERSION;  Surgeon: Adrian Prows, MD;  Location: [facility] ENDOSCOPY;  Service: Cardiovascular;  Laterality: N/A;   CARDIOVERSION N/A 03/11/2020   Procedure: CARDIOVERSION;  Surgeon: Adrian Prows, MD;  Location: [facility];  Service: Cardiovascular;  Laterality: N/A;   CARPAL TUNNEL RELEASE     Bilateral   DILATION AND CURETTAGE OF UTERUS     left rotator cuff     resection uterine polyp  2011   Social History   Tobacco Use   Smoking status: Never   Smokeless tobacco: Never  Substance Use Topics   Alcohol use: No    Review of Systems  Constitutional: Negative for malaise/fatigue.  Cardiovascular:  Negative for chest pain, claudication, dyspnea on exertion, leg swelling, near-syncope, orthopnea, palpitations, paroxysmal nocturnal dyspnea and syncope.  Musculoskeletal:  Arthritis: left hip.    Objective:  There were no vitals taken for this visit. There is no height or weight on file to calculate BMI.      01/28/2022   11:00 AM 12/29/2021   11:00 AM 12/15/2021    9:00 AM  Vitals with BMI  Height 5\' 4"  5\' 4"  5\' 4"   Weight 254 lbs 256 lbs 261 lbs  BMI 43.58 53.61 44.31  Systolic 540 086 761  Diastolic 80 62  75  Pulse 56 56 59    No data found.   Physical Exam Vitals reviewed.  Constitutional:      Appearance: She is obese.  Neck:     Comments: Short neck and difficult to evaluate JVP Cardiovascular:     Rate and Rhythm: Normal rate and regular rhythm.     Pulses: Intact distal pulses.     Heart sounds: Normal heart sounds, S1 normal and S2 normal. No murmur heard.    No gallop.  Pulmonary:     Effort: Pulmonary effort is normal. No respiratory distress.     Breath sounds: Normal breath sounds. No wheezing, rhonchi or  rales.  Abdominal:     Comments: Obese  Musculoskeletal:     Right lower leg: No edema.     Left lower leg: No edema.  Neurological:     General: No focal deficit present.     Mental Status: She is alert and oriented to person, place, and time. Mental status is at baseline.     Cranial Nerves: No cranial nerve deficit.    Laboratory examination:      Latest Ref Rng & Units 07/30/2021   12:41 PM 05/28/2020   11:27 AM 03/11/2020   10:18 AM  CMP  Glucose 70 - 99 mg/dL 105  86  101   BUN 8 - 27 mg/dL $Remove'16  20  17   'GNyvfhL$ Creatinine 0.57 - 1.00 mg/dL 0.98  1.16  1.10   Sodium 134 - 144 mmol/L 140  139  143   Potassium 3.5 - 5.2 mmol/L 4.7  4.3  4.1   Chloride 96 - 106 mmol/L 102  100  106   CO2 20 - 29 mmol/L 26  26    Calcium 8.7 - 10.3 mg/dL 9.7  9.0        Latest Ref Rng & Units 03/11/2020   10:18 AM 03/11/2020   10:06 AM 02/14/2020    1:07 PM  CBC  WBC 3.4 - 10.8 x10E3/uL   10.2   Hemoglobin 12.0 - 15.0 g/dL 13.9  13.3  13.4   Hematocrit 36.0 - 46.0 % 41.0  39.0  41.2   Platelets 150 - 450 x10E3/uL   196    Lipid Panel     Component Value Date/Time   CHOL 129 11/14/2018 0830   TRIG 83 11/14/2018 0830   HDL 49 11/14/2018 0830   LDLCALC 63 11/14/2018 0830   HEMOGLOBIN A1C Lab Results  Component Value Date   HGBA1C 5.7 (H) 12/15/2021   TSH Recent Labs    12/15/21 1046  TSH 2.750    External labs: 03/23/2021: BUN 18, creatinine 1.16, GFR 54, potassium 4.4 Hgb 13.9 Total cholesterol 130, HDL 53, LDL 66, triglycerides 53  03/17/2020:  BUN 13, creatinine 1.0, EGFR 54.8, sodium 139, potassium 4.6 Hemoglobin 12.9, hematocrit 39.7, platelets 185 Total cholesterol 108, triglycerides 57, HDL 44, LDL 53  02/27/2019: HDL 46, LDL 63, triglycerides 76, total cholesterol 124 BUN 14, creatinine 0.90, TSH 0.84  11/21/2017: Cholesterol 133, triglycerides 88, HDL 47, LDL 88.  Creatinine 0.94, EGFR 63/72, potassium 4.7, CMP normal.  CBC normal.  Allergies   Allergies   Allergen Reactions   Aspirin Other (See Comments)   Azithromycin Other (See Comments)    unknown   Loratadine Other (See Comments)   Macrobid [Nitrofurantoin] Hives   Sulfa Antibiotics     Other reaction(s): Unknown   Ceftin [Cefuroxime] Itching and Rash   Codeine Nausea And Vomiting  Morphine Nausea And Vomiting    Medications Prior to Visit:   Outpatient Medications Prior to Visit  Medication Sig Dispense Refill   acetaminophen (TYLENOL) 500 MG tablet Take 500-1,000 mg by mouth every 6 (six) hours as needed (for pain.).     amLODipine (NORVASC) 5 MG tablet TAKE 1 TABLET (5 MG TOTAL) BY MOUTH DAILY. 90 tablet 1   Ascorbic Acid (VITAMIN C PO) Take by mouth.     Ascorbic Acid (VITAMIN C) 500 MG CAPS Take 500 mg by mouth every evening.      Cholecalciferol (VITAMIN D-3) 125 MCG (5000 UT) TABS Take 5,000 mg by mouth daily.     Cranberry 1000 MG CAPS Take 1 capsule by mouth daily.     flecainide (TAMBOCOR) 100 MG tablet TAKE 1 TABLET BY MOUTH TWICE A DAY 180 tablet 3   hydrALAZINE (APRESOLINE) 25 MG tablet TAKE 1 TABLET (25 MG TOTAL) BY MOUTH 3 (THREE) TIMES DAILY AS NEEDED (FOR SYSTOLIC BP >017 MMHG).     levothyroxine (SYNTHROID) 175 MCG tablet Take 175 mcg by mouth daily.     metoprolol tartrate (LOPRESSOR) 25 MG tablet TAKE 1 TABLET BY MOUTH THREE TIMES A DAY 270 tablet 3   rivaroxaban (XARELTO) 20 MG TABS tablet Take 1 tablet (20 mg total) by mouth every evening. 90 tablet 3   rosuvastatin (CRESTOR) 10 MG tablet TAKE ONE HALF TABLET BY MOUTH ONCE A DAY. 45 tablet 3   SUTAB [phone] MG TABS Take by mouth as directed.     VITAMIN B COMPLEX-C PO Take 1 capsule by mouth daily.     No facility-administered medications prior to visit.   Final Medications at End of Visit    No outpatient medications have been marked as taking for the 02/15/22 encounter (Office Visit) with Floydene Flock, DO.   Radiology:   No results found.  Cardiac Studies:   PCV MYOCARDIAL PERFUSION WO  LEXISCAN 04/07/2020 Lexiscan/modified Bruce nuclear stress test performed using 1-day protocol. Patient reached 5 mets and 80% MPHR. Stress EKG at 80% MPHR showed sinus tachcyardia, no ischemic changes. Normal myocardial perfusion. Stress LVEF 64%. Low risk study.  Direct-current cardioversion 03/11/2020: Converted to normal sinus rhythm with 120 J of electricity x1.  Direct-current cardioversion 02/26/2020:  A flutter to A. fib to normal sinus rhythm with 75x 1 then 150 Joules of electricity X 1  PCV ECHOCARDIOGRAM COMPLETE 82/42/3536 Normal LV systolic function with EF 58%. Left ventricle cavity is normal in size. Normal global wall motion. Indeterminate diastolic filling pattern. Calculated EF 58%. Left atrial cavity is mildly dilated. Structurally normal mitral valve.  Mild (Grade I) mitral regurgitation. Structurally normal tricuspid valve with trace regurgitation. No evidence of pulmonary hypertension. Patient appeared to be in atrial flutter during examination. Compared to 03/19/2015, rhythm abnormal now.  Sleep Study 2006: Used CPAP. Repeat sleep study. 05/28/2015 by Dr. Caryl Comes: No e/o sleep apnea.   Chest x-ray 04/30/2015: Mild CHF, atherosclerotic calcification the aortic arch.   Direct current cardioversion 04/29/2015: A. Flutter to NSR with 75J x 1.   Treadmill Exercise stress 10/11/2014: Indication: Palpitations, Chest pain The patient exercised according to Bruce Protocol, Total time recorded 04:25min achieving max heart rate of 141 which was 90% of MPHR for age and 5.80 METS of work. Normal BP response. Resting ECG NSR, low voltage complexes. There was no ST-T changes of ischemia with exercise stress test. Stress terminated due to THR (>85% MPHR)/MPHR met and fatigue. Decreased exercise tolerence.   EKG:  02/15/2022: Sinus rhythm at a rate of 60 bpm with first-degree AV block.  Normal axis.  Incomplete right bundle branch block. No evidence of ischemia or underlying injury  pattern. No significant change compared to prior  07/01/2021: Sinus rhythm at a rate of 57 bpm with first-degree AV block.  Normal axis.  Incomplete right bundle branch block.  Poor R wave progression, cannot exclude anteroseptal infarct old.  No evidence of ischemia or underlying injury pattern.  Compared to EKG 12/31/2020, no significant change.  EKG 12/31/2020: Sinus bradycardia with first-degree AV block at a rate of 50 bpm.  Normal axis.  Incomplete right bundle branch block.  Poor R wave progression, cannot exclude anteroseptal infarct old.  No evidence of ischemia or underlying injury pattern.  Compared to EKG 05/21/2020, no significant change.  EKG 03/03/2020: Atrial fibrillation with controlled ventricular response at a rate of 94 bpm.  Normal axis.  Incomplete right bundle branch block.  Poor R wave progression cannot exclude anteroseptal infarct old.  Compared to EKG 02/14/2020, ventricular rate has improved.  EKG 07/27/2019: Normal sinus rhythm with rate of 64 bpm, left abnormality, normal axis.  No evidence of ischemia, normal EKG.   No significant change from EKG 01/23/2019.   Assessment:     ICD-10-CM   1. Essential hypertension  I10 EKG 12-Lead    2. Paroxysmal atrial flutter (HCC)  I48.92     3. Mixed hyperlipidemia  E78.2      No orders of the defined types were placed in this encounter.   There are no discontinued medications.  This patients CHA2DS2-VASc Score 3 (HTN, Age, F) and yearly risk of stroke 3.2%.   Recommendations:    [name] [name]  is a 71 y.o.  with paroxysmal atypical atrial flutter, hyperlipidemia, morbid obesity and hypertension. She was found to be in atrial flutter with rapid ventricular response on 03/13/2015.  It was felt to be atypical atrial flutter and was evaluated by Dr. [name], recommended cardioversion, successful direct current cardioversion on 04/29/2015 with no recurrence.  02/14/2020 patient presented in atrial fibrillation with rapid  ventricular response and underwent successful cardioversion 02/26/2020.  She again underwent successful cardioversion from atrial fibrillation to sinus rhythm on 03/11/2020.   Essential hypertension Continue current medication Start taking hydralazine TID every day Keep BP log and call office Patient will call if her BP starts to run low after she loses weight  Encourage low-sodium diet, less than 2000 mg daily.   Paroxysmal atrial flutter (HCC) Maintaining normal sinus rhythm   Mixed hyperlipidemia Continue Crestor   Follow-up in 6-12 months, sooner if needed.  Patient will continue to monitor blood pressure at home and notify our office if it is not well controlled.   Floydene Flock, DO, [facility] 02/15/2022, 1:02 PM Office: [phone]

## 2022-02-16 DIAGNOSIS — I83811 Varicose veins of right lower extremities with pain: Secondary | ICD-10-CM | POA: Diagnosis not present

## 2022-02-16 DIAGNOSIS — Z09 Encounter for follow-up examination after completed treatment for conditions other than malignant neoplasm: Secondary | ICD-10-CM | POA: Diagnosis not present

## 2022-02-20 DIAGNOSIS — Z23 Encounter for immunization: Secondary | ICD-10-CM | POA: Diagnosis not present

## 2022-03-01 ENCOUNTER — Other Ambulatory Visit: Payer: Self-pay | Admitting: Urology

## 2022-03-01 NOTE — Telephone Encounter (Signed)
Refill denied, per last OV  "Will have her stop taking trimethoprim, viral stewardship reviewed in detail and she is agreeable this plan"

## 2022-03-03 ENCOUNTER — Ambulatory Visit (INDEPENDENT_AMBULATORY_CARE_PROVIDER_SITE_OTHER): Payer: PPO | Admitting: Family Medicine

## 2022-03-03 ENCOUNTER — Encounter (INDEPENDENT_AMBULATORY_CARE_PROVIDER_SITE_OTHER): Payer: Self-pay | Admitting: Family Medicine

## 2022-03-03 VITALS — BP 161/83 | HR 82 | Temp 97.7°F | Ht 64.0 in | Wt 254.0 lb

## 2022-03-03 DIAGNOSIS — E669 Obesity, unspecified: Secondary | ICD-10-CM

## 2022-03-03 DIAGNOSIS — N8110 Cystocele, unspecified: Secondary | ICD-10-CM | POA: Diagnosis not present

## 2022-03-03 DIAGNOSIS — Z6841 Body Mass Index (BMI) 40.0 and over, adult: Secondary | ICD-10-CM | POA: Diagnosis not present

## 2022-03-03 DIAGNOSIS — E66813 Obesity, class 3: Secondary | ICD-10-CM

## 2022-03-03 DIAGNOSIS — I10 Essential (primary) hypertension: Secondary | ICD-10-CM

## 2022-03-03 DIAGNOSIS — R7303 Prediabetes: Secondary | ICD-10-CM

## 2022-03-08 DIAGNOSIS — I83891 Varicose veins of right lower extremities with other complications: Secondary | ICD-10-CM | POA: Diagnosis not present

## 2022-03-15 DIAGNOSIS — Z09 Encounter for follow-up examination after completed treatment for conditions other than malignant neoplasm: Secondary | ICD-10-CM | POA: Diagnosis not present

## 2022-03-15 DIAGNOSIS — I83891 Varicose veins of right lower extremities with other complications: Secondary | ICD-10-CM | POA: Diagnosis not present

## 2022-03-16 NOTE — Progress Notes (Signed)
Chief Complaint:   OBESITY [name] [name] is here to discuss her progress with her obesity treatment plan along with follow-up of her obesity related diagnoses. [name] [name] is on the Category 1 Plan and states she is following her eating plan approximately 70% of the time. [name] [name] states she is some walking 20 minutes 3 times per week.  Today's visit was #: 4 Starting weight: 261 lbs Starting date: 12/15/2021 Today's weight: 254 lbs Today's date: 03/03/2022 Total lbs lost to date: 7 lbs Total lbs lost since last in-office visit: 0  Interim History: Stress levels have been higher.  Walks with husband 3 times per week.  Exercise is limited by spinal stenosis.  Fairly good on eating plan but had some fried chicken and some pizza.  Traded out ice cream for yasso bars.   Subjective:   1. Essential hypertension Blood pressure elevated today on amlodipine 5 mg daily and hydralazine 25 mg TID and metoprolol 25 mg TID.  Home blood pressures 130's-160's/80-90's.   2. Female bladder prolapse Using pessary and topical estrogen cream one time per week.  Able to do some light walking.   3. Pre-diabetes A1c, 5.7.  Actively working on cutting out high sugar items.    Assessment/Plan:   1. Essential hypertension Keep upcoming visit with PCP in December.   2. Female bladder prolapse Continue pessary.  Follow up with OBGYN.    3. Pre-diabetes Continue to work on dietary changes.  Has room for more consistent exercise.   4. Obesity,current BMI 43.7 Band it and handout given.   [name] [name] is currently in the action stage of change. As such, her goal is to continue with weight loss efforts. She has agreed to the Category 1 Plan+80 protein daily.   Exercise goals:  Add resistance bands 15 minutes 3-4 times per week.   Behavioral modification strategies: increasing lean protein intake, decreasing simple carbohydrates, increasing vegetables, increasing water intake, decreasing eating out, no skipping  meals, meal planning and cooking strategies, keeping healthy foods in the home, and decreasing junk food.  [name] [name] has agreed to follow-up with our clinic in 4 weeks. She was informed of the importance of frequent follow-up visits to maximize her success with intensive lifestyle modifications for her multiple health conditions.   Objective:   Blood pressure (!) 161/83, pulse 82, temperature 97.7 F (36.5 C), height 5\' 4"  (1.626 m), weight 254 lb (115.2 kg), SpO2 99 %. Body mass index is 43.6 kg/m.  General: Cooperative, alert, well developed, in no acute distress. HEENT: Conjunctivae and lids unremarkable. Cardiovascular: Regular rhythm.  Lungs: Normal work of breathing. Neurologic: No focal deficits.   Lab Results  Component Value Date   CREATININE 0.98 07/30/2021   BUN 16 07/30/2021   NA 140 07/30/2021   K 4.7 07/30/2021   CL 102 07/30/2021   CO2 26 07/30/2021   Lab Results  Component Value Date   ALT 12 11/14/2018   AST 17 11/14/2018   ALKPHOS 47 11/14/2018   BILITOT 0.3 11/14/2018   Lab Results  Component Value Date   HGBA1C 5.7 (H) 12/15/2021   Lab Results  Component Value Date   INSULIN 9.0 12/15/2021   Lab Results  Component Value Date   TSH 2.750 12/15/2021   Lab Results  Component Value Date   CHOL 129 11/14/2018   HDL 49 11/14/2018   LDLCALC 63 11/14/2018   TRIG 83 11/14/2018   No results found for: "VD25OH" Lab Results  Component Value Date  WBC 10.2 02/14/2020   HGB 13.9 03/11/2020   HCT 41.0 03/11/2020   MCV 91 02/14/2020   PLT 196 02/14/2020   No results found for: "IRON", "TIBC", "FERRITIN"  Attestation Statements:   Reviewed by clinician on day of visit: allergies, medications, problem list, medical history, surgical history, family history, social history, and previous encounter notes.  I, Malcolm Metro, am acting as Energy manager for Seymour Bars, DO.  I have reviewed the above documentation for accuracy and completeness, and I  agree with the above. Glennis Brink, DO

## 2022-03-18 DIAGNOSIS — N760 Acute vaginitis: Secondary | ICD-10-CM | POA: Diagnosis not present

## 2022-03-18 DIAGNOSIS — N8182 Incompetence or weakening of pubocervical tissue: Secondary | ICD-10-CM | POA: Diagnosis not present

## 2022-03-20 ENCOUNTER — Other Ambulatory Visit: Payer: Self-pay | Admitting: Urology

## 2022-03-29 DIAGNOSIS — E785 Hyperlipidemia, unspecified: Secondary | ICD-10-CM | POA: Diagnosis not present

## 2022-03-29 DIAGNOSIS — E039 Hypothyroidism, unspecified: Secondary | ICD-10-CM | POA: Diagnosis not present

## 2022-03-29 DIAGNOSIS — Z1212 Encounter for screening for malignant neoplasm of rectum: Secondary | ICD-10-CM | POA: Diagnosis not present

## 2022-04-01 ENCOUNTER — Ambulatory Visit (INDEPENDENT_AMBULATORY_CARE_PROVIDER_SITE_OTHER): Payer: PPO | Admitting: Family Medicine

## 2022-04-01 ENCOUNTER — Encounter (INDEPENDENT_AMBULATORY_CARE_PROVIDER_SITE_OTHER): Payer: Self-pay | Admitting: Family Medicine

## 2022-04-01 VITALS — BP 147/77 | HR 55 | Temp 98.0°F | Ht 64.0 in | Wt 252.0 lb

## 2022-04-01 DIAGNOSIS — R7303 Prediabetes: Secondary | ICD-10-CM | POA: Diagnosis not present

## 2022-04-01 DIAGNOSIS — Z6841 Body Mass Index (BMI) 40.0 and over, adult: Secondary | ICD-10-CM | POA: Diagnosis not present

## 2022-04-01 DIAGNOSIS — E669 Obesity, unspecified: Secondary | ICD-10-CM | POA: Diagnosis not present

## 2022-04-01 DIAGNOSIS — M4800 Spinal stenosis, site unspecified: Secondary | ICD-10-CM

## 2022-04-07 DIAGNOSIS — I83891 Varicose veins of right lower extremities with other complications: Secondary | ICD-10-CM | POA: Diagnosis not present

## 2022-04-12 NOTE — Progress Notes (Unsigned)
Chief Complaint:   OBESITY [name] [name] is here to discuss her progress with her obesity treatment plan along with follow-up of her obesity related diagnoses. [name] [name] is on the Category 1 Plan and states she is following her eating plan approximately 75% of the time. [name] [name] states she is not exercising.  Today's visit was #: 5 Starting weight: 261 LBS Starting date: 12/15/2021 Today's weight: 252 LBS Today's date: 04/01/2022 Total lbs lost to date: 9 LBS Total lbs lost since last in-office visit: 2 LBS  Interim History: Patient is getting tired of some of the meal plan foods.  She feels adequately for with breakfast options.  She skips lunch some days not hungry.  She may snack on pita crackers and string cheese.  She is always having a veggies with her dinner.  She has been walking lately due to a sinus infection.  Subjective:   1. Pre-diabetes Last A1c was 5.7.  She is working on reducing intake of added sugar.  She has not started to exercise.  2. Spinal stenosis, unspecified spinal region She has pain and tingling that radiates to her buttocks and down leg limiting walking.  Assessment/Plan:   1. Pre-diabetes Continue low sugar diet.  Discussed with patient about adding in home exercise.  2. Spinal stenosis, unspecified spinal region Continue to do home PT exercises and walking as tolerated with her husband.  Consider PT in 2024 to decrease fall risk.  3. Obesity,current BMI 43.4 1.  Reviewed nutrition label for Activia, it lacks adequate protein, and has too much sugar. 2.  Try out chair exercises. 3.  Okay to add in a protein shake, microwave veggies for lunch.  [name] [name] is currently in the action stage of change. As such, her goal is to continue with weight loss efforts. She has agreed to the Category 1 Plan +80 protein daily.  Exercise goals: All adults should avoid inactivity. Some physical activity is better than none, and adults who participate in any amount of  physical activity gain some health benefits.  Behavioral modification strategies: increasing lean protein intake, increasing vegetables, increasing water intake, decreasing eating out, no skipping meals, meal planning and cooking strategies, keeping healthy foods in the home, and decreasing junk food.  [name] [name] has agreed to follow-up with our clinic in 4-5 weeks. She was informed of the importance of frequent follow-up visits to maximize her success with intensive lifestyle modifications for her multiple health conditions.   Objective:   Blood pressure (!) 147/77, pulse (!) 55, temperature 98 F (36.7 C), height 5\' 4"  (1.626 m), weight 252 lb (114.3 kg), SpO2 99 %. Body mass index is 43.26 kg/m.  General: Cooperative, alert, well developed, in no acute distress. HEENT: Conjunctivae and lids unremarkable. Cardiovascular: Regular rhythm.  Lungs: Normal work of breathing. Neurologic: No focal deficits.   Lab Results  Component Value Date   CREATININE 0.98 07/30/2021   BUN 16 07/30/2021   NA 140 07/30/2021   K 4.7 07/30/2021   CL 102 07/30/2021   CO2 26 07/30/2021   Lab Results  Component Value Date   ALT 12 11/14/2018   AST 17 11/14/2018   ALKPHOS 47 11/14/2018   BILITOT 0.3 11/14/2018   Lab Results  Component Value Date   HGBA1C 5.7 (H) 12/15/2021   Lab Results  Component Value Date   INSULIN 9.0 12/15/2021   Lab Results  Component Value Date   TSH 2.750 12/15/2021   Lab Results  Component Value Date   CHOL  129 11/14/2018   HDL 49 11/14/2018   LDLCALC 63 11/14/2018   TRIG 83 11/14/2018   No results found for: "VD25OH" Lab Results  Component Value Date   WBC 10.2 02/14/2020   HGB 13.9 03/11/2020   HCT 41.0 03/11/2020   MCV 91 02/14/2020   PLT 196 02/14/2020   No results found for: "IRON", "TIBC", "FERRITIN"  Attestation Statements:   Reviewed by clinician on day of visit: allergies, medications, problem list, medical history, surgical history, family  history, social history, and previous encounter notes.  I, Malcolm Metro, am acting as Energy manager for Seymour Bars, DO.  I have reviewed the above documentation for accuracy and completeness, and I agree with the above. Glennis Brink, DO

## 2022-04-19 DIAGNOSIS — Z1331 Encounter for screening for depression: Secondary | ICD-10-CM | POA: Diagnosis not present

## 2022-04-19 DIAGNOSIS — Z1339 Encounter for screening examination for other mental health and behavioral disorders: Secondary | ICD-10-CM | POA: Diagnosis not present

## 2022-04-19 DIAGNOSIS — E785 Hyperlipidemia, unspecified: Secondary | ICD-10-CM | POA: Diagnosis not present

## 2022-04-19 DIAGNOSIS — I480 Paroxysmal atrial fibrillation: Secondary | ICD-10-CM | POA: Diagnosis not present

## 2022-04-19 DIAGNOSIS — E039 Hypothyroidism, unspecified: Secondary | ICD-10-CM | POA: Diagnosis not present

## 2022-04-19 DIAGNOSIS — Z0000 Encounter for general adult medical examination without abnormal findings: Secondary | ICD-10-CM | POA: Diagnosis not present

## 2022-04-19 DIAGNOSIS — N8182 Incompetence or weakening of pubocervical tissue: Secondary | ICD-10-CM | POA: Diagnosis not present

## 2022-04-20 DIAGNOSIS — R82998 Other abnormal findings in urine: Secondary | ICD-10-CM | POA: Diagnosis not present

## 2022-04-21 DIAGNOSIS — I83811 Varicose veins of right lower extremities with pain: Secondary | ICD-10-CM | POA: Diagnosis not present

## 2022-04-21 DIAGNOSIS — I83891 Varicose veins of right lower extremities with other complications: Secondary | ICD-10-CM | POA: Diagnosis not present

## 2022-04-28 ENCOUNTER — Other Ambulatory Visit: Payer: Self-pay

## 2022-04-28 DIAGNOSIS — I83811 Varicose veins of right lower extremities with pain: Secondary | ICD-10-CM | POA: Diagnosis not present

## 2022-04-28 DIAGNOSIS — I480 Paroxysmal atrial fibrillation: Secondary | ICD-10-CM

## 2022-04-28 DIAGNOSIS — I83891 Varicose veins of right lower extremities with other complications: Secondary | ICD-10-CM | POA: Diagnosis not present

## 2022-04-28 DIAGNOSIS — M7989 Other specified soft tissue disorders: Secondary | ICD-10-CM | POA: Diagnosis not present

## 2022-04-28 MED ORDER — HYDRALAZINE HCL 25 MG PO TABS: ORAL_TABLET | ORAL | 1 refills | Status: DC

## 2022-04-28 MED ORDER — RIVAROXABAN 20 MG PO TABS: 20.0000 mg | ORAL_TABLET | Freq: Every evening | ORAL | 3 refills | Status: DC

## 2022-04-28 MED ORDER — FLECAINIDE ACETATE 100 MG PO TABS: 100.0000 mg | ORAL_TABLET | Freq: Two times a day (BID) | ORAL | 3 refills | Status: DC

## 2022-04-29 ENCOUNTER — Ambulatory Visit (INDEPENDENT_AMBULATORY_CARE_PROVIDER_SITE_OTHER): Payer: PPO | Admitting: Podiatry

## 2022-04-29 ENCOUNTER — Ambulatory Visit (INDEPENDENT_AMBULATORY_CARE_PROVIDER_SITE_OTHER): Payer: PPO

## 2022-04-29 ENCOUNTER — Encounter: Payer: Self-pay | Admitting: Podiatry

## 2022-04-29 DIAGNOSIS — L600 Ingrowing nail: Secondary | ICD-10-CM

## 2022-04-29 DIAGNOSIS — M722 Plantar fascial fibromatosis: Secondary | ICD-10-CM | POA: Diagnosis not present

## 2022-04-29 MED ORDER — TRIAMCINOLONE ACETONIDE 10 MG/ML IJ SUSP
20.0000 mg | Freq: Once | INTRAMUSCULAR | Status: AC
  Administered 2022-04-29: 20 mg

## 2022-04-29 NOTE — Patient Instructions (Signed)

## 2022-04-29 NOTE — Progress Notes (Signed)
Subjective:   Patient ID: [name] [name], female   DOB: 71 y.o.   MRN: [id number]   HPI Patient presents with severe pain in the plantar heel region right over left with inflammation fluid around the medial band.  Patient states it has been present for around 4 months and she had tried good feet orthotics which have not provided benefit given the inflammatory condition.  Patient does also have mild varicosities and incurvated nail bed left hallux medial border and does not smoke likes to be active moderate obesity noted   Review of Systems  All other systems reviewed and are negative.       Objective:  Physical Exam Vitals and nursing note reviewed.  Constitutional:      Appearance: She is well-developed.  Pulmonary:     Effort: Pulmonary effort is normal.  Musculoskeletal:        General: Normal range of motion.  Skin:    General: Skin is warm.  Neurological:     Mental Status: She is alert.     Neurovascular status found to be intact muscle strength found to be adequate range of motion adequate moderate flatfoot deformity.  Patient is found to have acute discomfort plantar fascial band at the insertion calcaneus with fluid buildup right over left with exquisite discomfort with palpation right over left.  Incurvated medial border left hallux only mild pain currently     Assessment:  Acute Planter fasciitis right over left along with ingrown toenail left big toe     Plan:  H&P and reviewed condition and x-ray.  At this point I did do sterile prep injected the fascial insertion bilateral 3 mg Kenalog 5 mg Xylocaine and applied fascial brace to lift up the arch which was properly fitted into the arch right to take stress off the heel.  Advised on reduced activity gave instructions on physical therapy stretching exercises shoe gear modifications reappoint 2 weeks to reevaluate  X-rays indicate spur formation right over left heel with moderate depression of the arch

## 2022-05-04 ENCOUNTER — Encounter (INDEPENDENT_AMBULATORY_CARE_PROVIDER_SITE_OTHER): Payer: Self-pay | Admitting: Family Medicine

## 2022-05-04 ENCOUNTER — Ambulatory Visit (INDEPENDENT_AMBULATORY_CARE_PROVIDER_SITE_OTHER): Payer: PPO | Admitting: Family Medicine

## 2022-05-04 VITALS — BP 156/73 | HR 55 | Temp 98.1°F | Ht 64.0 in | Wt 249.0 lb

## 2022-05-04 DIAGNOSIS — I8393 Asymptomatic varicose veins of bilateral lower extremities: Secondary | ICD-10-CM

## 2022-05-04 DIAGNOSIS — Z6841 Body Mass Index (BMI) 40.0 and over, adult: Secondary | ICD-10-CM

## 2022-05-04 DIAGNOSIS — E559 Vitamin D deficiency, unspecified: Secondary | ICD-10-CM

## 2022-05-04 DIAGNOSIS — E669 Obesity, unspecified: Secondary | ICD-10-CM | POA: Diagnosis not present

## 2022-05-04 DIAGNOSIS — R7303 Prediabetes: Secondary | ICD-10-CM | POA: Diagnosis not present

## 2022-05-06 IMAGING — MG MM DIGITAL DIAGNOSTIC UNILAT*L* W/ TOMO W/ CAD
6 series · 6 of 18 positions shown · non-contrast
Comparison: Previous exam(s).

ACR Breast Density Category a: The breast tissue is almost entirely
fatty.

CLINICAL DATA: 70-year-old female presenting for screening recall
for a left breast asymmetry.

EXAM:
DIGITAL DIAGNOSTIC UNILATERAL LEFT MAMMOGRAM WITH TOMOSYNTHESIS AND
CAD
TECHNIQUE: Left digital diagnostic mammography and breast tomosynthesis was
performed. The images were evaluated with computer-aided detection.

[L CC synth-2D (1 of 2)]
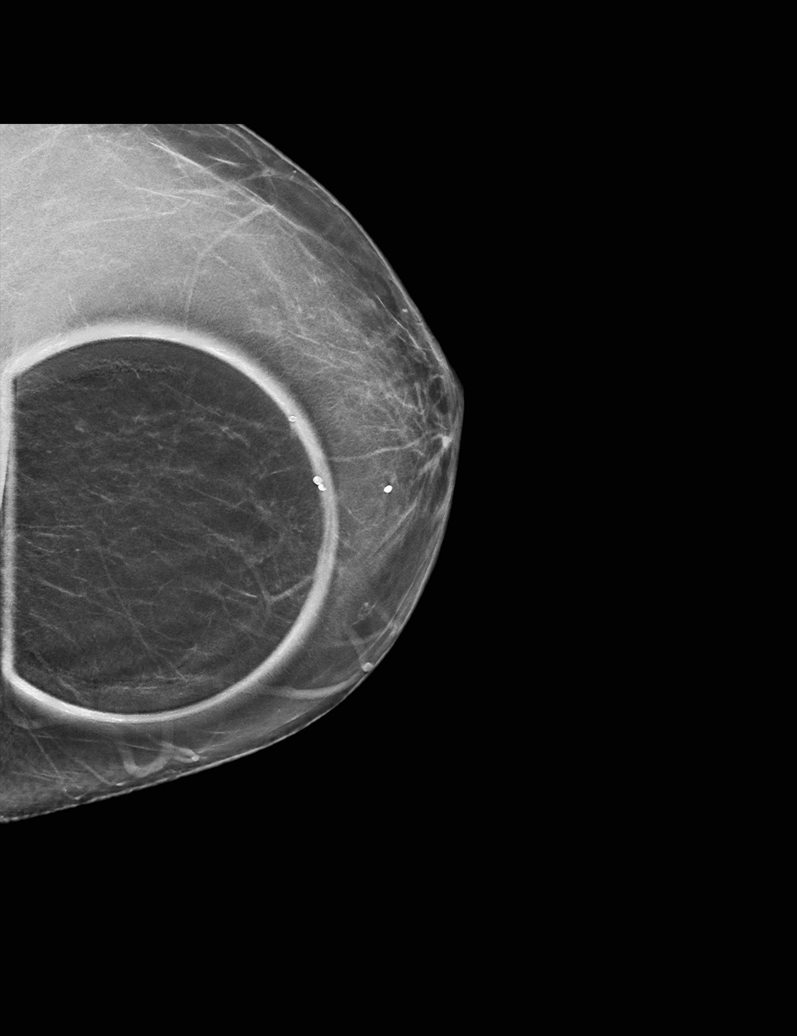

[L CC synth-2D (2 of 2)]
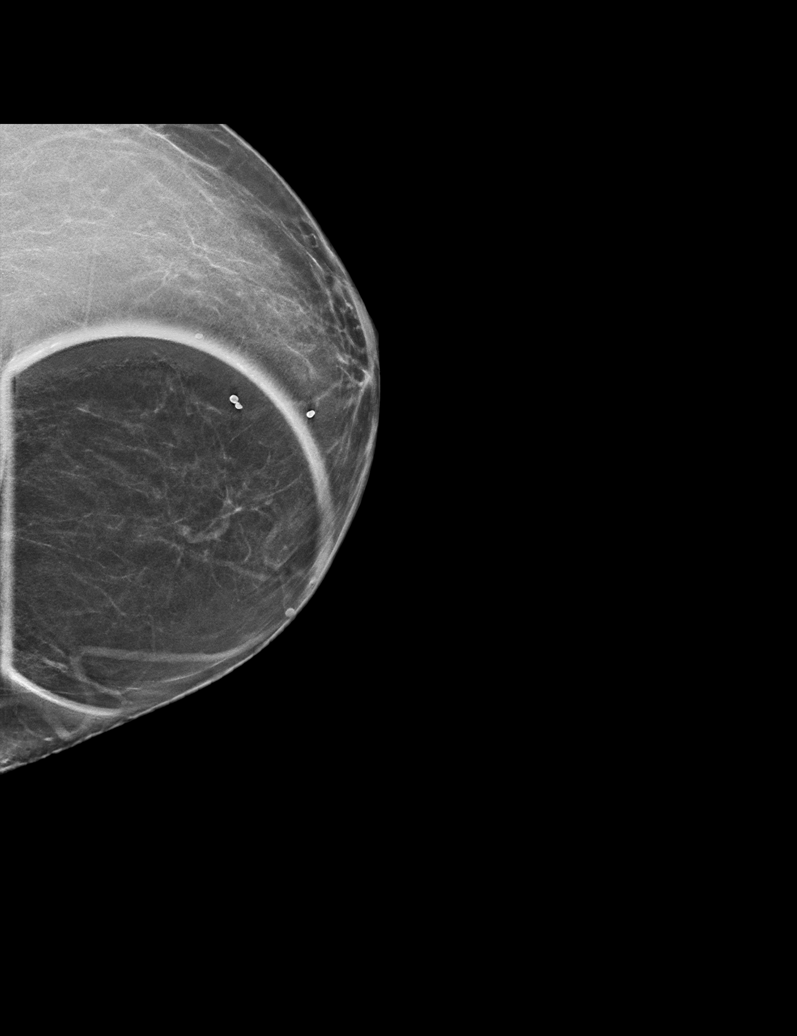

[L ML synth-2D]
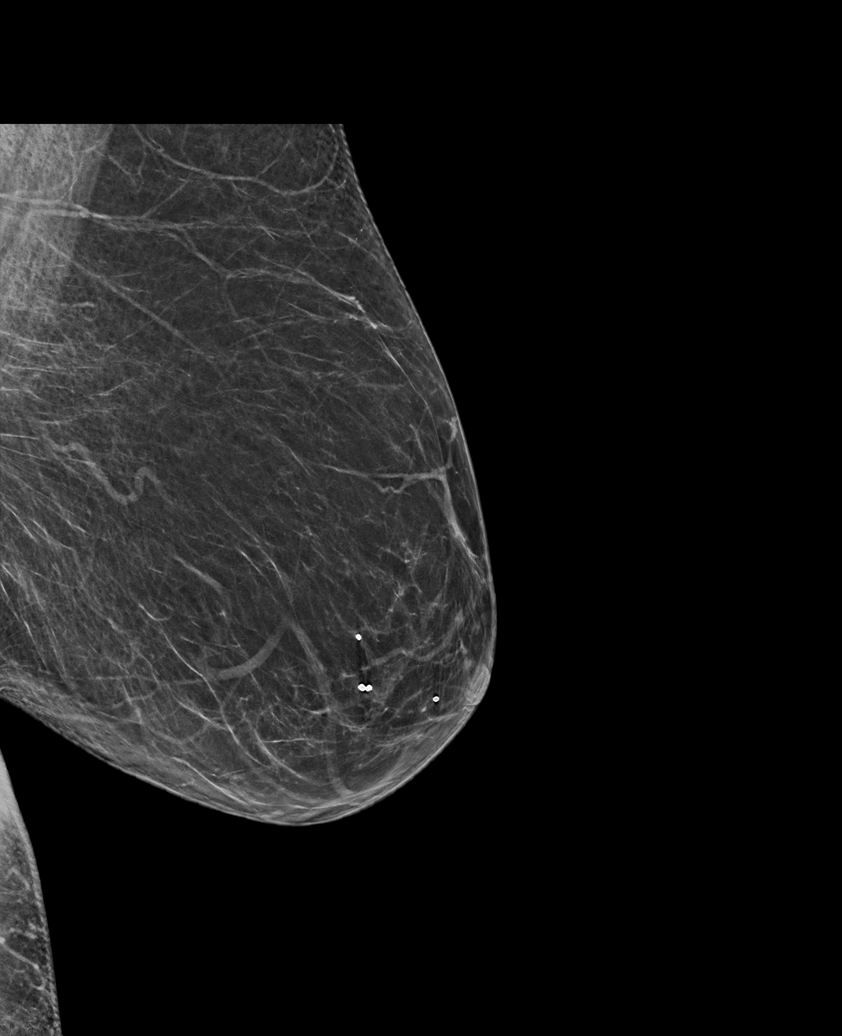

[L CC tomo (1 of 2) · tomo slice 23/45.0]
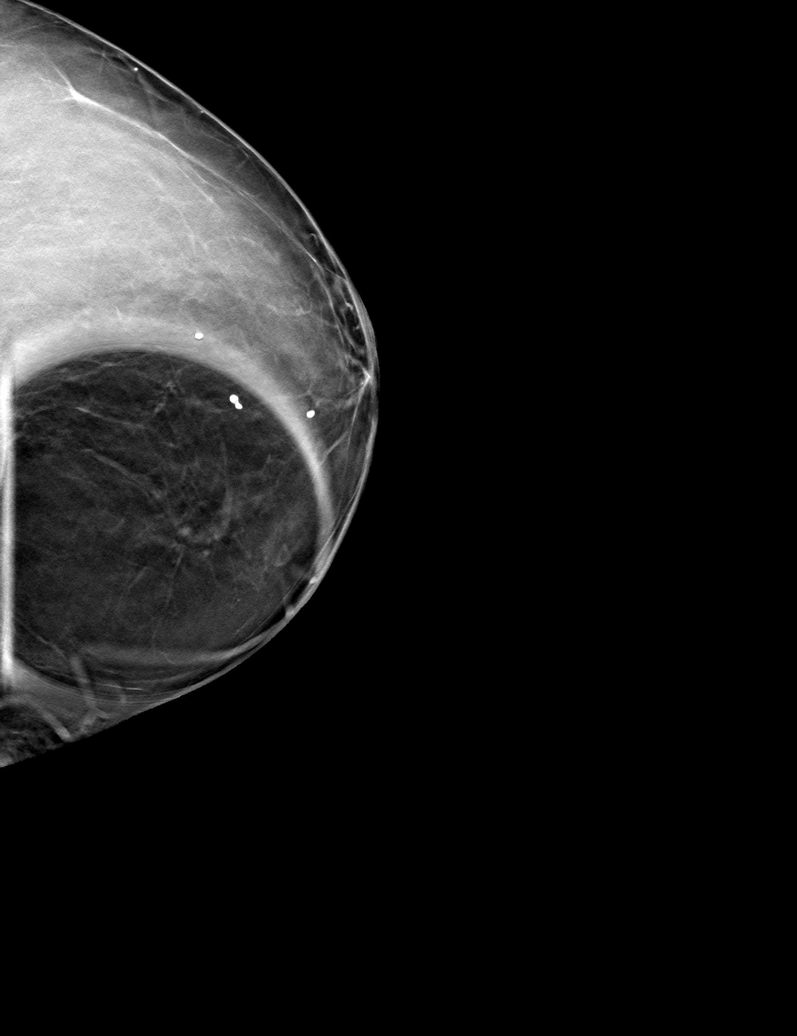

[L CC tomo (2 of 2) · tomo slice 26/51.0]
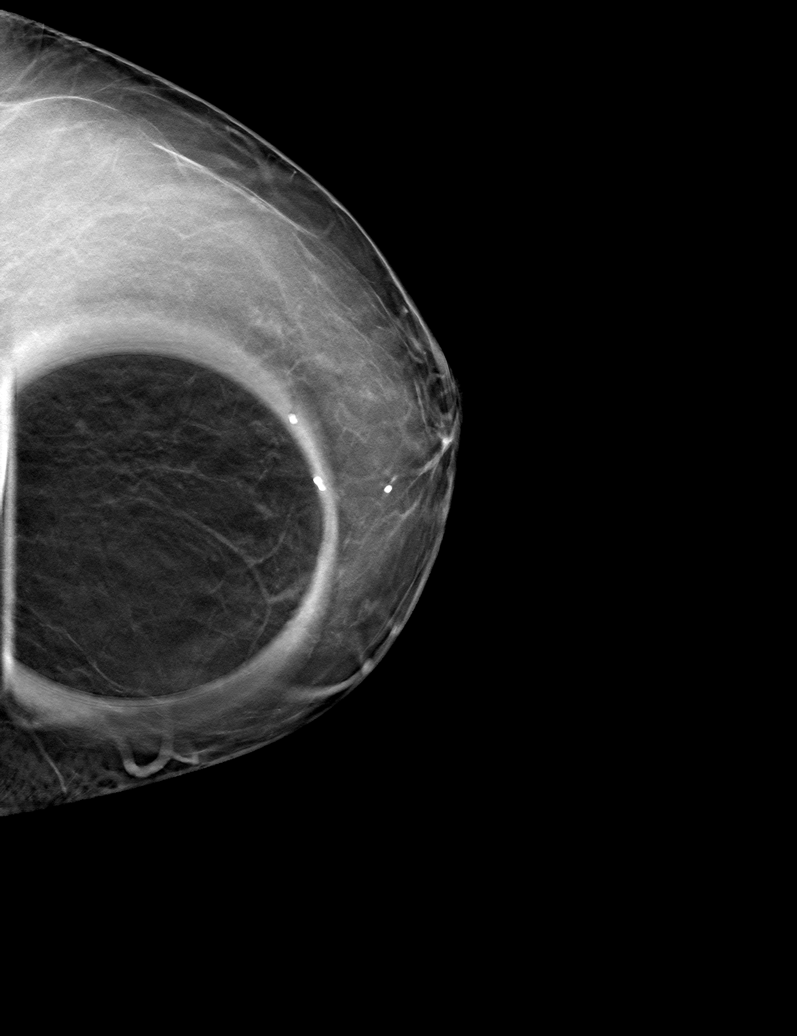

[L ML tomo · tomo slice 33/64.0]
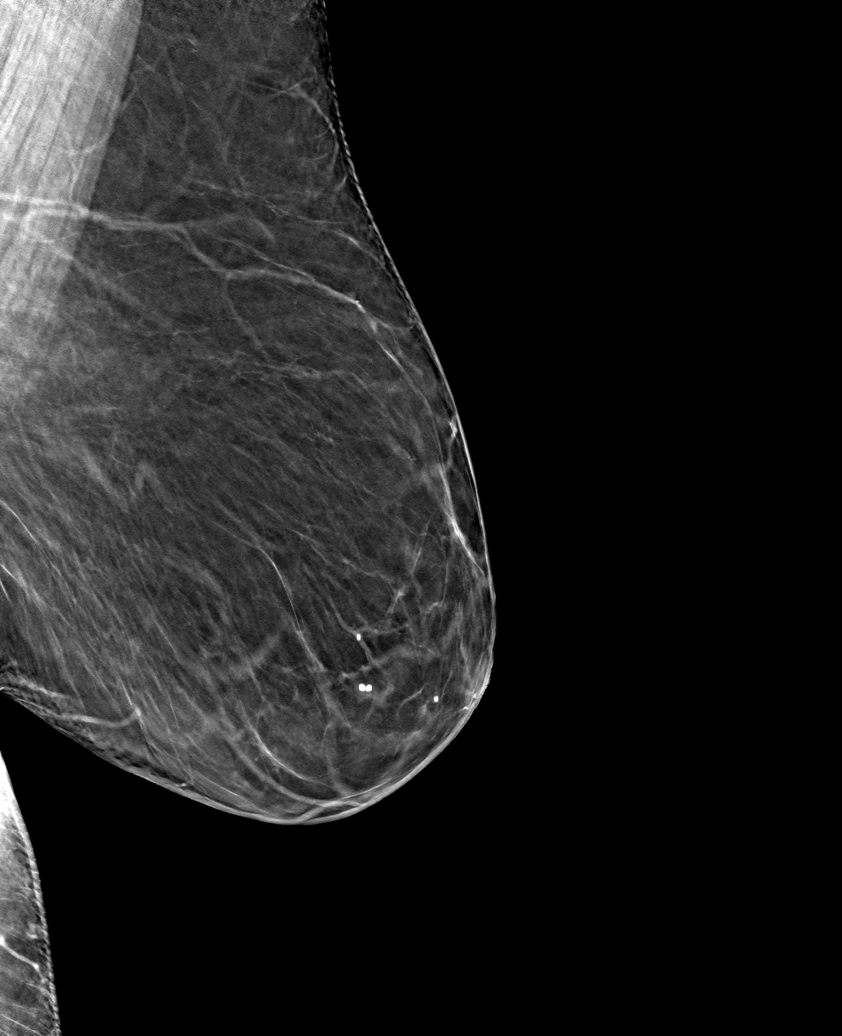

[6 of 18 positions shown; findings below may reference images not displayed]

FINDINGS: The asymmetry in the lower-inner quadrant of the left breast
resolves with spot compression tomosynthesis imaging, consistent
with overlapping fibroglandular tissue. No suspicious
calcifications, masses or areas of distortion are seen in the left
breast.
IMPRESSION: Resolution of the left breast asymmetry consistent with overlapping
fibroglandular tissue.

RECOMMENDATION:
Screening mammogram in one year.(Code:3F-B-BIY)

I have discussed the findings and recommendations with the patient.
If applicable, a reminder letter will be sent to the patient
regarding the next appointment.

BI-RADS CATEGORY  1: Negative.

## 2022-05-06 NOTE — Progress Notes (Signed)
Chief Complaint:   OBESITY [name] [name] is here to discuss her progress with her obesity treatment plan along with follow-up of her obesity related diagnoses. [name] [name] is on the Category 1 Plan +80 protein daily and states she is following her eating plan approximately 90% of the time. [name] [name] states she is walking 25 minutes 5 times per week.  Today's visit was #: 6 Starting weight: 261 lbs Starting date: 12/15/2021 Today's weight: 249 lbs Today's date: 05/04/2022 Total lbs lost to date: 12 lbs Total lbs lost since last in-office visit: 3 lbs  Interim History: Patient has felt better since PCP adjusted her levothyroxine dose up, TSH was too high.( I do not have access to these labs).  Constipation has improved.  She has been walking more outdoors.  She has been sticking to the meal plan.   Subjective:   1. Varicose veins of both lower extremities, unspecified whether complicated Patient has had good results with pain injections on the right leg and plans to start on the left leg.  She will pause water exercise, but has less pain and throbbing.  2. Pre-diabetes Last A1c was 5.7 on 12/15/2021.  Patient is actively working on reducing sugar intake.  3. Vitamin D deficiency Last vitamin D level was 29 on 12/15/2021.  Patient is taking OTC vitamin D 5000 IU daily.  Assessment/Plan:   1. Varicose veins of both lower extremities, unspecified whether complicated Follow-up with vein clinic and continue active plan for weight loss.  2. Pre-diabetes Increase walking time, recheck A1c next visit.  Consider metformin.  3. Vitamin D deficiency Continue OTC vitamin D 5000 IU daily.  Recheck in 1 to 2 months with a goal between 50-70.  4. Obesity,current BMI 42.8 1.  Okay to substitute one high fiber carbohydrate serving for  one serving of an apple or berries. 2.  Discussed A1c plan for exercise.  [name] [name] is currently in the action stage of change. As such, her goal is to continue with  weight loss efforts. She has agreed to the Category 1 Plan.   Exercise goals: All adults should avoid inactivity. Some physical activity is better than none, and adults who participate in any amount of physical activity gain some health benefits. Plans to check out [place].   Behavioral modification strategies: increasing lean protein intake, increasing vegetables, increasing water intake, meal planning and cooking strategies, keeping healthy foods in the home, planning for success, and decreasing junk food.  [name] [name] has agreed to follow-up with our clinic in 4 weeks. She was informed of the importance of frequent follow-up visits to maximize her success with intensive lifestyle modifications for her multiple health conditions.   Objective:   Blood pressure (!) 156/73, pulse (!) 55, temperature 98.1 F (36.7 C), height 5\' 4"  (1.626 m), weight 249 lb (112.9 kg), SpO2 100 %. Body mass index is 42.74 kg/m.  General: Cooperative, alert, well developed, in no acute distress. HEENT: Conjunctivae and lids unremarkable. Cardiovascular: Regular rhythm.  Lungs: Normal work of breathing. Neurologic: No focal deficits.   Lab Results  Component Value Date   CREATININE 0.98 07/30/2021   BUN 16 07/30/2021   NA 140 07/30/2021   K 4.7 07/30/2021   CL 102 07/30/2021   CO2 26 07/30/2021   Lab Results  Component Value Date   ALT 12 11/14/2018   AST 17 11/14/2018   ALKPHOS 47 11/14/2018   BILITOT 0.3 11/14/2018   Lab Results  Component Value Date   HGBA1C 5.7 (H)  12/15/2021   Lab Results  Component Value Date   INSULIN 9.0 12/15/2021   Lab Results  Component Value Date   TSH 2.750 12/15/2021   Lab Results  Component Value Date   CHOL 129 11/14/2018   HDL 49 11/14/2018   LDLCALC 63 11/14/2018   TRIG 83 11/14/2018   No results found for: "VD25OH" Lab Results  Component Value Date   WBC 10.2 02/14/2020   HGB 13.9 03/11/2020   HCT 41.0 03/11/2020   MCV 91 02/14/2020   PLT 196  02/14/2020   No results found for: "IRON", "TIBC", "FERRITIN"  Attestation Statements:   Reviewed by clinician on day of visit: allergies, medications, problem list, medical history, surgical history, family history, social history, and previous encounter notes.  I, Davy Pique, am acting as Location manager for Loyal Gambler, DO.  I have reviewed the above documentation for accuracy and completeness, and I agree with the above. Dell Ponto, DO

## 2022-05-14 ENCOUNTER — Ambulatory Visit: Payer: PPO | Admitting: Podiatry

## 2022-05-26 ENCOUNTER — Encounter: Payer: Self-pay | Admitting: Podiatry

## 2022-05-26 ENCOUNTER — Ambulatory Visit (INDEPENDENT_AMBULATORY_CARE_PROVIDER_SITE_OTHER): Payer: PPO | Admitting: Podiatry

## 2022-05-26 DIAGNOSIS — M722 Plantar fascial fibromatosis: Secondary | ICD-10-CM

## 2022-05-26 NOTE — Progress Notes (Signed)
Subjective:   Patient ID: [name] [name], female   DOB: 72 y.o.   MRN: [id number]   HPI Patient states she is doing very well after having pain at the last visit and states she is ecstatic that the pain is gone   ROS      Objective:  Physical Exam  Neurovascular status intact muscle strength adequate pain in the right plantar fascia significantly improved with only mild discomfort upon deep palpation     Assessment:  Acute fasciitis improved right with mild discomfort still noted     Plan:  H&P reviewed condition spent a great deal time going over the fact this has been present a long time we discussed treatment options possibly for the future we discussed anti-inflammatories physical therapy continued shoe gear modifications and brace usage.  I want her returning to all activities with all questions answered today

## 2022-05-27 DIAGNOSIS — G2581 Restless legs syndrome: Secondary | ICD-10-CM | POA: Diagnosis not present

## 2022-05-27 DIAGNOSIS — R600 Localized edema: Secondary | ICD-10-CM | POA: Diagnosis not present

## 2022-05-27 DIAGNOSIS — I83892 Varicose veins of left lower extremities with other complications: Secondary | ICD-10-CM | POA: Diagnosis not present

## 2022-06-01 ENCOUNTER — Encounter (INDEPENDENT_AMBULATORY_CARE_PROVIDER_SITE_OTHER): Payer: Self-pay | Admitting: Family Medicine

## 2022-06-01 ENCOUNTER — Ambulatory Visit (INDEPENDENT_AMBULATORY_CARE_PROVIDER_SITE_OTHER): Payer: PPO | Admitting: Family Medicine

## 2022-06-01 VITALS — BP 114/63 | HR 62 | Temp 98.1°F | Ht 64.0 in | Wt 247.0 lb

## 2022-06-01 DIAGNOSIS — E669 Obesity, unspecified: Secondary | ICD-10-CM | POA: Diagnosis not present

## 2022-06-01 DIAGNOSIS — Z6841 Body Mass Index (BMI) 40.0 and over, adult: Secondary | ICD-10-CM

## 2022-06-01 DIAGNOSIS — R632 Polyphagia: Secondary | ICD-10-CM | POA: Diagnosis not present

## 2022-06-01 DIAGNOSIS — R7303 Prediabetes: Secondary | ICD-10-CM | POA: Diagnosis not present

## 2022-06-01 DIAGNOSIS — M4800 Spinal stenosis, site unspecified: Secondary | ICD-10-CM

## 2022-06-01 LAB — LIPID PANEL
Cholesterol: 137 (ref 0–200)
HDL: 48 (ref 35–70)
LDL Cholesterol: 75
LDl/HDL Ratio: 1.6
Triglycerides: 68 (ref 40–160)

## 2022-06-01 LAB — BASIC METABOLIC PANEL
CO2: 25 — AB (ref 13–22)
Chloride: 105 (ref 99–108)
Creatinine: 1 (ref 0.5–1.1)
Glucose: 102
Potassium: 4.5 mEq/L (ref 3.5–5.1)
Sodium: 140 (ref 137–147)

## 2022-06-01 LAB — COMPREHENSIVE METABOLIC PANEL
Albumin: 3.5 (ref 3.5–5.0)
Calcium: 8.9 (ref 8.7–10.7)
eGFR: 54.5

## 2022-06-01 LAB — CBC: RBC: 4.3 (ref 3.87–5.11)

## 2022-06-01 LAB — HEPATIC FUNCTION PANEL
ALT: 8 U/L (ref 7–35)
AST: 14 (ref 13–35)
Alkaline Phosphatase: 43 (ref 25–125)
Bilirubin, Direct: 0 — AB
Bilirubin, Total: 0.6

## 2022-06-01 LAB — CBC AND DIFFERENTIAL
HCT: 41 (ref 36–46)
Hemoglobin: 13.3 (ref 12.0–16.0)
WBC: 6.4

## 2022-06-01 LAB — FECAL OCCULT BLOOD, GUAIAC: Fecal Occult Blood: NEGATIVE

## 2022-06-01 LAB — TSH: TSH: 4.93 (ref 0.41–5.90)

## 2022-06-14 DIAGNOSIS — N8182 Incompetence or weakening of pubocervical tissue: Secondary | ICD-10-CM | POA: Diagnosis not present

## 2022-06-14 NOTE — Progress Notes (Signed)
Chief Complaint:   OBESITY [name] [name] is here to discuss her progress with her obesity treatment plan along with follow-up of her obesity related diagnoses. [name] [name] is on the Category 1 Plan and states she is following her eating plan approximately 85-90% of the time. [name] [name] states she has not been exercising.  Today's visit was #: 7 Starting weight: 261 lbs Starting date: 12/15/21 Today's weight: 247 lbs Today's date: 06/01/22 Total lbs lost to date: 14 Total lbs lost since last in-office visit: -2  Interim History: Had COVID-19 infection earlier this month. Energy level is lower.  Was trying to walk more but has not been very active.  Having dizzy spells since having COVID.  Getting tired of some of the foods on the meal plan.  Subjective:   1. Polyphagia Improving with eating on a schedule including proteins and fiber with each meal.  2. Prediabetes Has lost 5.3% total body weight in 5 months. Last A1c 5.7. Had labs updated with PCP-not available for review.  3. Spinal stenosis, unspecified spinal region Walking limited due to leg weakness and back pain.   Assessment/Plan:   1. Polyphagia Reviewed additional breakfast options-handout given.  2. Prediabetes Continue a low sugar diet.  3. Spinal stenosis, unspecified spinal region Doing chair exercises at home.  4. Obesity, current BMI 42.4 1.  Resume walking and chair exercises once feeling better. 2.  Increase water intake. 3.  Bring in a copy of labs from PCP.  [name] [name] is currently in the action stage of change. As such, her goal is to continue with weight loss efforts. She has agreed to the Category 1 Plan.   Exercise goals: All adults should avoid inactivity. Some physical activity is better than none, and adults who participate in any amount of physical activity gain some health benefits.  Behavioral modification strategies: increasing lean protein intake, increasing vegetables, increasing water intake,  decreasing liquid calories, decreasing eating out, no skipping meals, meal planning and cooking strategies, keeping healthy foods in the home, and planning for success.  [name] [name] has agreed to follow-up with our clinic in 4 weeks. She was informed of the importance of frequent follow-up visits to maximize her success with intensive lifestyle modifications for her multiple health conditions.   Objective:   Blood pressure 114/63, pulse 62, temperature 98.1 F (36.7 C), height 5' 4"$  (1.626 m), weight 247 lb (112 kg), SpO2 97 %. Body mass index is 42.4 kg/m.  General: Cooperative, alert, well developed, in no acute distress. HEENT: Conjunctivae and lids unremarkable. Cardiovascular: Regular rhythm.  Lungs: Normal work of breathing. Neurologic: No focal deficits.   Lab Results  Component Value Date   CREATININE 0.98 07/30/2021   BUN 16 07/30/2021   NA 140 07/30/2021   K 4.7 07/30/2021   CL 102 07/30/2021   CO2 26 07/30/2021   Lab Results  Component Value Date   ALT 12 11/14/2018   AST 17 11/14/2018   ALKPHOS 47 11/14/2018   BILITOT 0.3 11/14/2018   Lab Results  Component Value Date   HGBA1C 5.7 (H) 12/15/2021   Lab Results  Component Value Date   INSULIN 9.0 12/15/2021   Lab Results  Component Value Date   TSH 2.750 12/15/2021   Lab Results  Component Value Date   CHOL 129 11/14/2018   HDL 49 11/14/2018   LDLCALC 63 11/14/2018   TRIG 83 11/14/2018   No results found for: "VD25OH" Lab Results  Component Value Date   WBC 10.2 02/14/2020  HGB 13.9 03/11/2020   HCT 41.0 03/11/2020   MCV 91 02/14/2020   PLT 196 02/14/2020   No results found for: "IRON", "TIBC", "FERRITIN"   Attestation Statements:   Reviewed by clinician on day of visit: allergies, medications, problem list, medical history, surgical history, family history, social history, and previous encounter notes.  I have personally spent 30 minutes total time today in preparation, patient care,  nutritional counseling and documentation for this visit, including the following: review of clinical lab tests; review of medical tests/procedures/services.    I, Georgianne Fick, FNP, am acting as transcriptionist for Dr. Loyal Gambler.  I have reviewed the above documentation for accuracy and completeness, and I agree with the above. Dell Ponto, DO

## 2022-06-17 DIAGNOSIS — I83892 Varicose veins of left lower extremities with other complications: Secondary | ICD-10-CM | POA: Diagnosis not present

## 2022-06-22 DIAGNOSIS — I83892 Varicose veins of left lower extremities with other complications: Secondary | ICD-10-CM | POA: Diagnosis not present

## 2022-06-22 DIAGNOSIS — Z09 Encounter for follow-up examination after completed treatment for conditions other than malignant neoplasm: Secondary | ICD-10-CM | POA: Diagnosis not present

## 2022-06-22 DIAGNOSIS — M7989 Other specified soft tissue disorders: Secondary | ICD-10-CM | POA: Diagnosis not present

## 2022-06-22 DIAGNOSIS — I83812 Varicose veins of left lower extremities with pain: Secondary | ICD-10-CM | POA: Diagnosis not present

## 2022-06-30 ENCOUNTER — Encounter (INDEPENDENT_AMBULATORY_CARE_PROVIDER_SITE_OTHER): Payer: Self-pay

## 2022-06-30 ENCOUNTER — Ambulatory Visit (INDEPENDENT_AMBULATORY_CARE_PROVIDER_SITE_OTHER): Payer: PPO | Admitting: Adult Health

## 2022-06-30 ENCOUNTER — Encounter (INDEPENDENT_AMBULATORY_CARE_PROVIDER_SITE_OTHER): Payer: Self-pay | Admitting: Adult Health

## 2022-06-30 VITALS — BP 135/71 | HR 62 | Temp 97.9°F | Ht 64.0 in | Wt 249.0 lb

## 2022-06-30 DIAGNOSIS — E669 Obesity, unspecified: Secondary | ICD-10-CM | POA: Diagnosis not present

## 2022-06-30 DIAGNOSIS — E559 Vitamin D deficiency, unspecified: Secondary | ICD-10-CM

## 2022-06-30 DIAGNOSIS — Z6841 Body Mass Index (BMI) 40.0 and over, adult: Secondary | ICD-10-CM

## 2022-06-30 DIAGNOSIS — R7303 Prediabetes: Secondary | ICD-10-CM

## 2022-06-30 NOTE — Progress Notes (Signed)
Chief Complaint:   OBESITY [name] [name] is here to discuss her progress with her obesity treatment plan along with follow-up of her obesity related diagnoses. [name] [name] is on the Category 1 Plan and states she is following her eating plan approximately 95% of the time.  [name] [name] states she is walking 15 minutes 5 times per week.  Today's visit was #: 8 Starting weight: 261 lbs Starting date: 12/15/2021 Today's weight: 249 lbs Today's date: 06/30/2022 Total lbs lost to date: 16 Total lbs lost since last in-office visit: - 2 lbs  Interim History:  Since last office visit she endorses increase in myalgia and fatigue. Both have been issues for years, however she feels that sx's have worsened in last 3 weeks. Per pt, she was evaluated and dx'd with Fibromyalgia "years ago" by a Rheumatologist.  She is not on SSNRI therapy and has not been back to see Rheum.  She also states when she is walking "I don't feel like I am fully picking up me feet". She does not use any assistive devices when walking and denies recent hx of falls.  She feels that she can easily follow breakfast and lunch on Cat 1, dinner more of challenge.  She reports that her husband is very sensitive to smells and he will often complain when she is cooking meals in their kitchen.   She has brought lab work from PCP/Dr. [place] completed 06/01/2022 Reviewed results at length. TSH was slightly elevated 4.93 Normal Free T4 1.7 PCP adjusted Levothyroxine 115mg-  she takes daily and takes 2 tabs on Wed only. She will f/u with PCP Spring 2024.  Subjective:   1. Prediabetes Lab Results  Component Value Date   HGBA1C 5.7 (H) 12/15/2021   She is not on any blood glucose lowering medications. She has been trying to increase protein at each meal. She is adopted and has limited family medical hx information.  2. Vitamin D deficiency  Latest Reference Range & Units 12/15/21 10:46  Vitamin B12 232 - 1,245  pg/mL 476  Vitamin D, 25-Hydroxy, Serum ng/mL 49  She is on daily OTC Vit D3 5,000 IU She endorses an increase in fatigue that last 3 weeks. She estimates to sleep 8-9 hrs per night with waking's to void 2-3 times per night.  Assessment/Plan:   1. Prediabetes Continue to reduce sugar/CHO and increase protein  2. Vitamin D deficiency Continue OTC supplement and check labs this Spring  3. Obesity, current BMI 42.8 [name] [name] currently in the action stage of change. As such, her goal is to continue with weight loss efforts. She has agreed to the Category 1 Plan.   Exercise goals: Older adults should do exercises that maintain or improve balance if they are at risk of falling.   Behavioral modification strategies: increasing lean protein intake, decreasing simple carbohydrates, increasing vegetables, increasing water intake, increasing high fiber foods, meal planning and cooking strategies, keeping healthy foods in the home, better snacking choices, dealing with family or coworker sabotage, and planning for success.  DSalayahhas agreed to follow-up with our clinic in 3 weeks. She was informed of the importance of frequent follow-up visits to maximize her success with intensive lifestyle modifications for her multiple health conditions.   Objective:   Blood pressure 135/71, pulse 62, temperature 97.9 F (36.6 C), height '5\' 4"'$  (1.626 m), weight 249 lb (112.9 kg), SpO2 98 %. Body mass index is 42.74 kg/m.  General: Cooperative, alert, well developed, in no  acute distress. HEENT: Conjunctivae and lids unremarkable. Cardiovascular: Regular rhythm.  Lungs: Normal work of breathing. Neurologic: No focal deficits.   Lab Results  Component Value Date   CREATININE 0.98 07/30/2021   BUN 16 07/30/2021   NA 140 07/30/2021   K 4.7 07/30/2021   CL 102 07/30/2021   CO2 26 07/30/2021   Lab Results  Component Value Date   ALT 12 11/14/2018   AST 17 11/14/2018   ALKPHOS 47 11/14/2018    BILITOT 0.3 11/14/2018   Lab Results  Component Value Date   HGBA1C 5.7 (H) 12/15/2021   Lab Results  Component Value Date   INSULIN 9.0 12/15/2021   Lab Results  Component Value Date   TSH 2.750 12/15/2021   Lab Results  Component Value Date   CHOL 129 11/14/2018   HDL 49 11/14/2018   LDLCALC 63 11/14/2018   TRIG 83 11/14/2018   No results found for: "VD25OH" Lab Results  Component Value Date   WBC 10.2 02/14/2020   HGB 13.9 03/11/2020   HCT 41.0 03/11/2020   MCV 91 02/14/2020   PLT 196 02/14/2020   No results found for: "IRON", "TIBC", "FERRITIN"  Attestation Statements:   Reviewed by clinician on day of visit: allergies, medications, problem list, medical history, surgical history, family history, social history, and previous encounter notes.  Time spent on visit including pre-visit chart review and post-visit care and charting was 35 minutes.   I have reviewed the above documentation for accuracy and completeness, and I agree with the above. -  Jacynda Brunke d. Iisha Soyars, NP-C

## 2022-06-30 NOTE — Assessment & Plan Note (Signed)
Lab Results  Component Value Date   HGBA1C 5.7 (H) 12/15/2021   She is not currently on any blood glucose lowering medications. She has been trying to increase protein at each meal.  She is adopted and has limited family medical hx.

## 2022-06-30 NOTE — Assessment & Plan Note (Signed)
Latest Reference Range & Units 12/15/21 10:46  Vitamin D, 25-Hydroxy, Serum ng/mL 49  She is on daily OTC Vit D 3 5,000 IU She endorses increase in fatigue the last  3 weeks. She reports sleeping 7-8 hrs/night with several early waking's to void.

## 2022-07-07 DIAGNOSIS — I83892 Varicose veins of left lower extremities with other complications: Secondary | ICD-10-CM | POA: Diagnosis not present

## 2022-07-21 DIAGNOSIS — I83892 Varicose veins of left lower extremities with other complications: Secondary | ICD-10-CM | POA: Diagnosis not present

## 2022-07-21 DIAGNOSIS — I83812 Varicose veins of left lower extremities with pain: Secondary | ICD-10-CM | POA: Diagnosis not present

## 2022-07-21 DIAGNOSIS — M7989 Other specified soft tissue disorders: Secondary | ICD-10-CM | POA: Diagnosis not present

## 2022-07-27 ENCOUNTER — Ambulatory Visit (INDEPENDENT_AMBULATORY_CARE_PROVIDER_SITE_OTHER): Payer: PPO | Admitting: Adult Health

## 2022-08-02 ENCOUNTER — Other Ambulatory Visit: Payer: Self-pay | Admitting: Family Medicine

## 2022-08-02 DIAGNOSIS — M791 Myalgia, unspecified site: Secondary | ICD-10-CM | POA: Diagnosis not present

## 2022-08-02 DIAGNOSIS — M4306 Spondylolysis, lumbar region: Secondary | ICD-10-CM

## 2022-08-02 DIAGNOSIS — M5416 Radiculopathy, lumbar region: Secondary | ICD-10-CM | POA: Diagnosis not present

## 2022-08-02 DIAGNOSIS — I872 Venous insufficiency (chronic) (peripheral): Secondary | ICD-10-CM | POA: Diagnosis not present

## 2022-08-02 DIAGNOSIS — E039 Hypothyroidism, unspecified: Secondary | ICD-10-CM | POA: Diagnosis not present

## 2022-08-02 DIAGNOSIS — M797 Fibromyalgia: Secondary | ICD-10-CM | POA: Diagnosis not present

## 2022-08-02 DIAGNOSIS — Z7689 Persons encountering health services in other specified circumstances: Secondary | ICD-10-CM | POA: Diagnosis not present

## 2022-08-02 DIAGNOSIS — I480 Paroxysmal atrial fibrillation: Secondary | ICD-10-CM | POA: Diagnosis not present

## 2022-08-02 DIAGNOSIS — M2550 Pain in unspecified joint: Secondary | ICD-10-CM | POA: Diagnosis not present

## 2022-08-04 DIAGNOSIS — I87392 Chronic venous hypertension (idiopathic) with other complications of left lower extremity: Secondary | ICD-10-CM | POA: Diagnosis not present

## 2022-08-04 DIAGNOSIS — I83892 Varicose veins of left lower extremities with other complications: Secondary | ICD-10-CM | POA: Diagnosis not present

## 2022-08-18 DIAGNOSIS — I83892 Varicose veins of left lower extremities with other complications: Secondary | ICD-10-CM | POA: Diagnosis not present

## 2022-08-18 DIAGNOSIS — M7989 Other specified soft tissue disorders: Secondary | ICD-10-CM | POA: Diagnosis not present

## 2022-08-18 DIAGNOSIS — I83812 Varicose veins of left lower extremities with pain: Secondary | ICD-10-CM | POA: Diagnosis not present

## 2022-08-19 DIAGNOSIS — N8182 Incompetence or weakening of pubocervical tissue: Secondary | ICD-10-CM | POA: Diagnosis not present

## 2022-08-30 ENCOUNTER — Ambulatory Visit
Admission: RE | Admit: 2022-08-30 | Discharge: 2022-08-30 | Disposition: A | Discharge status: Home / Self Care | Payer: PPO | Source: Ambulatory Visit | Attending: Family Medicine | Admitting: Family Medicine

## 2022-08-30 DIAGNOSIS — M4306 Spondylolysis, lumbar region: Secondary | ICD-10-CM | POA: Diagnosis not present

## 2022-09-01 DIAGNOSIS — M4316 Spondylolisthesis, lumbar region: Secondary | ICD-10-CM | POA: Diagnosis not present

## 2022-09-01 DIAGNOSIS — Z6841 Body Mass Index (BMI) 40.0 and over, adult: Secondary | ICD-10-CM | POA: Diagnosis not present

## 2022-09-01 DIAGNOSIS — M48062 Spinal stenosis, lumbar region with neurogenic claudication: Secondary | ICD-10-CM | POA: Diagnosis not present

## 2022-09-08 ENCOUNTER — Encounter: Payer: Self-pay | Admitting: Cardiology

## 2022-09-13 DIAGNOSIS — R5383 Other fatigue: Secondary | ICD-10-CM | POA: Diagnosis not present

## 2022-09-13 DIAGNOSIS — R768 Other specified abnormal immunological findings in serum: Secondary | ICD-10-CM | POA: Diagnosis not present

## 2022-09-13 DIAGNOSIS — Z6841 Body Mass Index (BMI) 40.0 and over, adult: Secondary | ICD-10-CM | POA: Diagnosis not present

## 2022-09-13 DIAGNOSIS — M797 Fibromyalgia: Secondary | ICD-10-CM | POA: Diagnosis not present

## 2022-09-13 DIAGNOSIS — M791 Myalgia, unspecified site: Secondary | ICD-10-CM | POA: Diagnosis not present

## 2022-09-20 DIAGNOSIS — M48062 Spinal stenosis, lumbar region with neurogenic claudication: Secondary | ICD-10-CM | POA: Diagnosis not present

## 2022-09-22 ENCOUNTER — Other Ambulatory Visit: Payer: Self-pay | Admitting: Internal Medicine

## 2022-10-19 DIAGNOSIS — N8182 Incompetence or weakening of pubocervical tissue: Secondary | ICD-10-CM | POA: Diagnosis not present

## 2022-10-27 DIAGNOSIS — B028 Zoster with other complications: Secondary | ICD-10-CM | POA: Diagnosis not present

## 2022-10-27 DIAGNOSIS — I480 Paroxysmal atrial fibrillation: Secondary | ICD-10-CM | POA: Diagnosis not present

## 2022-11-11 ENCOUNTER — Ambulatory Visit: Payer: PPO | Admitting: Urology

## 2022-11-11 DIAGNOSIS — Z6841 Body Mass Index (BMI) 40.0 and over, adult: Secondary | ICD-10-CM | POA: Diagnosis not present

## 2022-11-11 DIAGNOSIS — M4316 Spondylolisthesis, lumbar region: Secondary | ICD-10-CM | POA: Diagnosis not present

## 2022-11-11 DIAGNOSIS — M48062 Spinal stenosis, lumbar region with neurogenic claudication: Secondary | ICD-10-CM | POA: Diagnosis not present

## 2022-12-29 DIAGNOSIS — M4306 Spondylolysis, lumbar region: Secondary | ICD-10-CM | POA: Diagnosis not present

## 2022-12-29 DIAGNOSIS — R5383 Other fatigue: Secondary | ICD-10-CM | POA: Diagnosis not present

## 2022-12-29 DIAGNOSIS — E039 Hypothyroidism, unspecified: Secondary | ICD-10-CM | POA: Diagnosis not present

## 2022-12-30 DIAGNOSIS — D649 Anemia, unspecified: Secondary | ICD-10-CM | POA: Diagnosis not present

## 2023-01-06 ENCOUNTER — Other Ambulatory Visit: Payer: Self-pay | Admitting: Internal Medicine

## 2023-01-06 ENCOUNTER — Other Ambulatory Visit: Payer: Self-pay

## 2023-01-06 MED ORDER — METOPROLOL TARTRATE 25 MG PO TABS: 25.0000 mg | ORAL_TABLET | Freq: Three times a day (TID) | ORAL | 3 refills | Status: DC

## 2023-01-18 DIAGNOSIS — N8182 Incompetence or weakening of pubocervical tissue: Secondary | ICD-10-CM | POA: Diagnosis not present

## 2023-01-22 ENCOUNTER — Other Ambulatory Visit: Payer: Self-pay | Admitting: Internal Medicine

## 2023-01-22 DIAGNOSIS — I480 Paroxysmal atrial fibrillation: Secondary | ICD-10-CM

## 2023-02-14 ENCOUNTER — Ambulatory Visit: Payer: Self-pay | Admitting: Cardiology

## 2023-02-15 DIAGNOSIS — I87392 Chronic venous hypertension (idiopathic) with other complications of left lower extremity: Secondary | ICD-10-CM | POA: Diagnosis not present

## 2023-02-15 DIAGNOSIS — I872 Venous insufficiency (chronic) (peripheral): Secondary | ICD-10-CM | POA: Diagnosis not present

## 2023-02-17 DIAGNOSIS — R051 Acute cough: Secondary | ICD-10-CM | POA: Diagnosis not present

## 2023-02-17 DIAGNOSIS — J069 Acute upper respiratory infection, unspecified: Secondary | ICD-10-CM | POA: Diagnosis not present

## 2023-02-26 DIAGNOSIS — Z23 Encounter for immunization: Secondary | ICD-10-CM | POA: Diagnosis not present

## 2023-03-07 DIAGNOSIS — Z01419 Encounter for gynecological examination (general) (routine) without abnormal findings: Secondary | ICD-10-CM | POA: Diagnosis not present

## 2023-03-07 DIAGNOSIS — Z1231 Encounter for screening mammogram for malignant neoplasm of breast: Secondary | ICD-10-CM | POA: Diagnosis not present

## 2023-03-07 DIAGNOSIS — Z6841 Body Mass Index (BMI) 40.0 and over, adult: Secondary | ICD-10-CM | POA: Diagnosis not present

## 2023-03-21 ENCOUNTER — Other Ambulatory Visit: Payer: Self-pay

## 2023-03-23 DIAGNOSIS — Z1211 Encounter for screening for malignant neoplasm of colon: Secondary | ICD-10-CM | POA: Diagnosis not present

## 2023-03-23 DIAGNOSIS — I4891 Unspecified atrial fibrillation: Secondary | ICD-10-CM | POA: Diagnosis not present

## 2023-03-23 DIAGNOSIS — R143 Flatulence: Secondary | ICD-10-CM | POA: Diagnosis not present

## 2023-03-24 ENCOUNTER — Telehealth: Payer: Self-pay | Admitting: *Deleted

## 2023-03-24 NOTE — Telephone Encounter (Signed)
   Pre-operative Risk Assessment    Patient Name: [name] [name]  DOB: 04-10-1951 MRN: [id number]   Last OV: Dr. Melton Alar 02/15/2022 Upcoming OV: Dr. Jacinto Halim 04/21/2023, pre op added to upcoming appt notes.   Request for Surgical Clearance    Procedure:   Colonoscopy  Date of Surgery:  Clearance 05/10/23                                 Surgeon:  Dr. Bernette Redbird Surgeon's Group or Practice Name:  [facility] Phone number:  [phone] Fax number:  [phone]   Type of Clearance Requested:   - Medical  - Pharmacy:  Hold Rivaroxaban (Xarelto) 3 days prior.   Type of Anesthesia:   Propofol   Additional requests/questions:    Signed, Emmit Pomfret   03/24/2023, 11:20 AM

## 2023-04-02 NOTE — Telephone Encounter (Signed)
Patient with diagnosis of atrial fibrillation on Xarelto for anticoagulation.    Procedure:   Colonoscopy   Date of Surgery:  Clearance 05/10/23     CHA2DS2-VASc Score = 3   This indicates a 3.2% annual risk of stroke. The patient's score is based upon: CHF History: 0 HTN History: 1 Diabetes History: 0 Stroke History: 0 Vascular Disease History: 0 Age Score: 1 Gender Score: 1   CrCl 98 Platelet count 184  Per office protocol, patient can hold Xarelto for 3 days prior to procedure.   Patient will not need bridging with Lovenox (enoxaparin) around procedure.  **This guidance is not considered finalized until pre-operative APP has relayed final recommendations.**

## 2023-04-15 ENCOUNTER — Telehealth: Payer: Self-pay | Admitting: Cardiology

## 2023-04-15 ENCOUNTER — Other Ambulatory Visit: Payer: Self-pay

## 2023-04-15 DIAGNOSIS — I480 Paroxysmal atrial fibrillation: Secondary | ICD-10-CM

## 2023-04-15 MED ORDER — FLECAINIDE ACETATE 100 MG PO TABS: 100.0000 mg | ORAL_TABLET | Freq: Two times a day (BID) | ORAL | 0 refills | Status: DC

## 2023-04-15 NOTE — Telephone Encounter (Signed)
*  STAT* If patient is at the pharmacy, call can be transferred to refill team.   1. Which medications need to be refilled? (please list name of each medication and dose if known)   flecainide (TAMBOCOR) 100 MG tablet    2. Which pharmacy/location (including street and city if local pharmacy) is medication to be sent to? [id number] - [place], [place] - [address]   3. Do they need a 30 day or 90 day supply? 90

## 2023-04-21 ENCOUNTER — Other Ambulatory Visit: Payer: Self-pay | Admitting: *Deleted

## 2023-04-21 ENCOUNTER — Ambulatory Visit: Payer: PPO | Attending: Internal Medicine | Admitting: Cardiology

## 2023-04-21 ENCOUNTER — Encounter: Payer: Self-pay | Admitting: Cardiology

## 2023-04-21 VITALS — BP 142/70 | HR 54 | Resp 12 | Ht 66.0 in | Wt 253.0 lb

## 2023-04-21 DIAGNOSIS — N1831 Chronic kidney disease, stage 3a: Secondary | ICD-10-CM

## 2023-04-21 DIAGNOSIS — Z01818 Encounter for other preprocedural examination: Secondary | ICD-10-CM | POA: Diagnosis not present

## 2023-04-21 DIAGNOSIS — I10 Essential (primary) hypertension: Secondary | ICD-10-CM | POA: Diagnosis not present

## 2023-04-21 DIAGNOSIS — I480 Paroxysmal atrial fibrillation: Secondary | ICD-10-CM

## 2023-04-21 MED ORDER — FLECAINIDE ACETATE 100 MG PO TABS: 100.0000 mg | ORAL_TABLET | Freq: Two times a day (BID) | ORAL | 3 refills | Status: DC

## 2023-04-21 MED ORDER — LOSARTAN POTASSIUM-HCTZ 50-12.5 MG PO TABS: 1.0000 | ORAL_TABLET | Freq: Every day | ORAL | 0 refills | Status: DC

## 2023-04-21 NOTE — Patient Instructions (Signed)
Medication Instructions:  Your physician has recommended you make the following change in your medication:  Start losartan/hydrochlorothiazide 50/12.5 mg by mouth daily in the morning   *If you need a refill on your cardiac medications before your next appointment, please call your pharmacy*   Lab Work: none If you have labs (blood work) drawn today and your tests are completely normal, you will receive your results only by: MyChart Message (if you have MyChart) OR A paper copy in the mail If you have any lab test that is abnormal or we need to change your treatment, we will call you to review the results.   Testing/Procedures: none   Follow-Up: At [facility], you and your health needs are our priority.  As part of our continuing mission to provide you with exceptional heart care, we have created designated Provider Care Teams.  These Care Teams include your primary Cardiologist (physician) and Advanced Practice Providers (APPs -  Physician Assistants and Nurse Practitioners) who all work together to provide you with the care you need, when you need it.  We recommend signing up for the patient portal called "MyChart".  Sign up information is provided on this After Visit Summary.  MyChart is used to connect with patients for Virtual Visits (Telemedicine).  Patients are able to view lab/test results, encounter notes, upcoming appointments, etc.  Non-urgent messages can be sent to your provider as well.   To learn more about what you can do with MyChart, go to [email or web address].    Your next appointment:   12 month(s)  Provider:   Yates Decamp, MD     Other Instructions

## 2023-04-21 NOTE — Progress Notes (Signed)
Cardiology Office Note:  .   Date:  04/21/2023  ID:  [name], DOB 1951/02/18, MRN [id number] PCP: Geoffry Paradise, MD  [facility] Providers Cardiologist:  Yates Decamp, MD   History of Present Illness: .   [name] [name] is a 72 y.o.  female  with paroxysmal atypical atrial flutter, hyperlipidemia, hyperglycemia, morbid obesity and hypertension.    Discussed the use of AI scribe software for clinical note transcription with the patient, who gave verbal consent to proceed.  History of Present Illness   The patient presents for preoperative clearance for a colonoscopy, which is due after a 12-year interval since the last one. The patient expresses reluctance to undergo the procedure, citing a previous assurance that she would not need another if the last one was clear, which it was.  The patient also reports no concerns about blood pressure. She has been monitoring it at home and reports readings around 132/70. She is currently on amlodipine and hydralazine for hypertension and has noticed some constipation and ankle swelling, which she attributes to the medication.  The patient also discusses a potential hysterectomy due to a prolapse issue that is currently managed with a pessary. She expresses frustration with the suggestion that a hysterectomy is necessary to tack the bladder, as she was previously told she would likely not need a hysterectomy.  Lastly, the patient's recent lab results indicate mild stage three chronic kidney disease, which has been stable for the past two years. The patient was not previously aware of this diagnosis.   Review of Systems  Cardiovascular:  Negative for chest pain, dyspnea on exertion and leg swelling.   Labs   Lab Results  Component Value Date   CHOL 137 06/01/2022   HDL 48 06/01/2022   LDLCALC 75 06/01/2022   TRIG 68 06/01/2022   Lab Results  Component Value Date   NA 140 06/01/2022   K 4.5 06/01/2022   CO2 25 (A) 06/01/2022    GLUCOSE 105 (H) 07/30/2021   BUN 16 07/30/2021   CREATININE 1.0 06/01/2022   CALCIUM 8.9 06/01/2022   EGFR 54.5 06/01/2022   GFRNONAA 48 (L) 05/28/2020      Latest Ref Rng & Units 06/01/2022   12:00 AM 07/30/2021   12:41 PM 05/28/2020   11:27 AM  BMP  Glucose 70 - 99 mg/dL  401  86   BUN 8 - 27 mg/dL  16  20   Creatinine 0.5 - 1.1 1.0     0.98  1.16   BUN/Creat Ratio 12 - 28  16  17    Sodium 137 - 147 140     140  139   Potassium 3.5 - 5.1 mEq/L 4.5     4.7  4.3   Chloride 99 - 108 105     102  100   CO2 13 - 22 25     26  26    Calcium 8.7 - 10.7 8.9     9.7  9.0      This result is from an external source.      Latest Ref Rng & Units 06/01/2022   12:00 AM 03/11/2020   10:18 AM 03/11/2020   10:06 AM  CBC  WBC  6.4        Hemoglobin 12.0 - 16.0 13.3     13.9  13.3   Hematocrit 36 - 46 41     41.0  39.0      This result is from  an external source.   External Labs:  Lab 09/13/2022:  Hb 13.0.  Platelets 184.  Serum creatinine 0.90.  eGFR 54 mL.  Physical Exam:   VS:  BP (!) 142/70 (BP Location: Left Arm, Patient Position: Sitting, Cuff Size: Large)   Pulse (!) 54   Resp 12   Ht 5\' 6"  (1.676 m)   Wt 253 lb (114.8 kg)   SpO2 98%   BMI 40.84 kg/m    Wt Readings from Last 3 Encounters:  04/21/23 253 lb (114.8 kg)  06/30/22 249 lb (112.9 kg)  06/01/22 247 lb (112 kg)     Physical Exam Constitutional:      Appearance: She is obese.  Neck:     Vascular: No carotid bruit or JVD.  Cardiovascular:     Rate and Rhythm: Normal rate and regular rhythm.     Pulses: Intact distal pulses.     Heart sounds: Normal heart sounds. No murmur heard.    No gallop.  Pulmonary:     Effort: Pulmonary effort is normal.     Breath sounds: Normal breath sounds.  Abdominal:     General: Bowel sounds are normal.     Palpations: Abdomen is soft.  Musculoskeletal:     Right lower leg: No edema.     Left lower leg: No edema.    Studies Reviewed: Marland Kitchen    Sleep study. 05/28/2015 by  Dr. Graciela Husbands: No e/o sleep apnea.   Echocardiogram 02/19/2020: Normal LV systolic function with EF 58%. Left ventricle cavity is normal in size. Normal global wall motion. Indeterminate diastolic filling pattern. Calculated EF 58%. Left atrial cavity is mildly dilated. Structurally normal mitral valve.  Mild (Grade I) mitral regurgitation. Structurally normal tricuspid valve with trace regurgitation. No evidence of pulmonary hypertension. Patient appeared to be in atrial flutter during examination. Compared to 03/19/2015, rhythm abnormal now. EKG:    EKG Interpretation Date/Time:  Thursday April 21 2023 11:25:37 EST Ventricular Rate:  57 PR Interval:  258 QRS Duration:  100 QT Interval:  448 QTC Calculation: 436 R Axis:   0  Text Interpretation: EKG 04/21/2023: Normal scratch that sinus rhythm with first-degree block at the rate of 57 bpm otherwise normal EKG.  No significant change from 02/15/2022. Confirmed by Delrae Rend [phone]) on 04/21/2023 12:08:09 PM    Medications and allergies    Allergies  Allergen Reactions   Aspirin Other (See Comments)   Azithromycin Other (See Comments)    unknown   Loratadine Other (See Comments)   Macrobid [Nitrofurantoin] Hives   Sulfa Antibiotics     Other reaction(s): Unknown   Ceftin [Cefuroxime] Itching and Rash   Codeine Nausea And Vomiting   Morphine Nausea And Vomiting    Current Outpatient Medications:    acetaminophen (TYLENOL) 500 MG tablet, Take 500-1,000 mg by mouth every 6 (six) hours as needed (for pain.)., Disp: , Rfl:    amLODipine (NORVASC) 10 MG tablet, Take 5 mg by mouth daily., Disp: , Rfl:    Cholecalciferol (VITAMIN D-3) 125 MCG (5000 UT) TABS, Take 5,000 mg by mouth daily., Disp: , Rfl:    hydrALAZINE (APRESOLINE) 25 MG tablet, TAKE 1 TABLET (25 MG TOTAL) BY MOUTH 3 (THREE) TIMES DAILY AS NEEDED (FOR SYSTOLIC BP >145 MMHG). (Patient taking differently: Take 25 mg by mouth 3 (three) times daily.), Disp: 270 tablet,  Rfl: 1   levothyroxine (SYNTHROID) 175 MCG tablet, Take 175 mcg by mouth daily., Disp: , Rfl:    losartan-hydrochlorothiazide (HYZAAR) 50-12.5  MG tablet, Take 1 tablet by mouth daily., Disp: 30 tablet, Rfl: 0   metoprolol tartrate (LOPRESSOR) 25 MG tablet, Take 1 tablet (25 mg total) by mouth 3 (three) times daily., Disp: 270 tablet, Rfl: 3   rivaroxaban (XARELTO) 20 MG TABS tablet, Take 1 tablet (20 mg total) by mouth every evening., Disp: 90 tablet, Rfl: 3   rosuvastatin (CRESTOR) 10 MG tablet, TAKE ONE HALF TABLET BY MOUTH ONCE A DAY. (Patient taking differently: Take 5 tablets by mouth daily. TAKE ONE HALF TABLET BY MOUTH ONCE A DAY.), Disp: 45 tablet, Rfl: 3   Ascorbic Acid (VITAMIN C PO), Take by mouth. (Patient not taking: Reported on 04/21/2023), Disp: , Rfl:    flecainide (TAMBOCOR) 100 MG tablet, Take 1 tablet (100 mg total) by mouth 2 (two) times daily., Disp: 180 tablet, Rfl: 3   VITAMIN B COMPLEX-C PO, Take 1 capsule by mouth daily., Disp: , Rfl:    ASSESSMENT AND PLAN: .      ICD-10-CM   1. Paroxysmal atrial fibrillation (HCC)  I48.0 flecainide (TAMBOCOR) 100 MG tablet    2. Essential hypertension  I10 amLODipine (NORVASC) 10 MG tablet    losartan-hydrochlorothiazide (HYZAAR) 50-12.5 MG tablet    3. Pre-op evaluation  Z01.818 EKG 12-Lead    4. Stage 3a chronic kidney disease (CKD) (HCC)  N18.31       Assessment and Plan    Hypertension Blood pressure elevated at 140/78 during visit, patient reports home readings around 130/70. Mild ankle edema and constipation possibly related to current medications (amlodipine and hydralazine). -Add Losartan HCT 50/12.5mg  daily in the morning to current regimen. -Primary care physician to monitor kidney function and adjust medication as needed.  She has an appointment next month for routine annual visit, in view of underlying stage IIIa chronic kidney disease would need to follow-up on the same.  Paroxysmal Atrial  Fibrillation Maintaining sinus rhythm on current regimen (Flecainide 100mg  BID, Metoprolol 25mg  BID, Rivaroxaban). -Continue current regimen.  Stage 3 Chronic Kidney Disease Stable over the past two years, patient was unaware of diagnosis. -Continue monitoring kidney function.  Preoperative Evaluation for Colonoscopy Patient is due for colonoscopy, last one was 12 years ago with no findings.  After discussions, she also wants to probably proceed with hysterectomy and bladder tack -Cleared from a cardiac standpoint for the procedure.  Potential Hysterectomy Patient considering hysterectomy due to uterine prolapse and discomfort from pessary. -Cleared from a cardiac standpoint for the procedure if patient decides to proceed.  Follow-up in 1 year.     Signed,  Yates Decamp, MD, [facility] 04/21/2023, 5:10 PM [facility] [address] [place], [place] [address] Phone: [phone]. Fax:  [phone]

## 2023-05-02 ENCOUNTER — Encounter: Payer: Self-pay | Admitting: Cardiology

## 2023-05-02 ENCOUNTER — Telehealth: Payer: Self-pay | Admitting: Cardiology

## 2023-05-02 DIAGNOSIS — N8182 Incompetence or weakening of pubocervical tissue: Secondary | ICD-10-CM | POA: Diagnosis not present

## 2023-05-02 NOTE — Telephone Encounter (Signed)
Pt c/o medication issue:  1. Name of Medication: losartan-hydrochlorothiazide (HYZAAR) 50-12.5 MG tablet   2. How are you currently taking this medication (dosage and times per day)? As prescribed   3. Are you having a reaction (difficulty breathing--STAT)? Yes   4. What is your medication issue? Patient is having stomach issues. Cramping and diarrhea after eating since starting this medication.

## 2023-05-02 NOTE — Telephone Encounter (Signed)
Spoke with pt and since starting this med Losartan hydrochlorothiazide 50/12.5 mg on 04/22/23 day one  Pt has noted extreme diarrhea with cramping is not tolerating Will forward to Dr Joan Flores for review and recommendations

## 2023-05-03 NOTE — Telephone Encounter (Signed)
 Can stop Losartan  HCT and diarrhea is a complication of Losartan , but she was also having constipation previously. We can stop her metoprolol  tartarate 25 mg TID and start labetalol 200 mg BID and see how she does. 60  tabs with 2 refills and not to pharmacy to discontinue Metoprolol . Probably good idea to have her come in for BP check and repeat EKG in 6 weeks in view of her age and 1st degree AV Block

## 2023-05-03 NOTE — Telephone Encounter (Signed)
 Called and spoke with patient. She would prefer to not change her metoprolol  prescription, stating she didn't any issue prior to beginning the Losartan . Per OV note by JG on 04/21/23: Hypertension Blood pressure elevated at 140/78 during visit, patient reports home readings around 130/70. Mild ankle edema and constipation possibly related to current medications (amlodipine  and hydralazine ). -Add Losartan  HCT 50/12.5mg  daily in the morning to current regimen. Paroxysmal Atrial Fibrillation Maintaining sinus rhythm on current regimen (Flecainide  100mg  BID, Metoprolol  25mg  BID, Rivaroxaban ). -Continue current regimen. She states she will stop the Losartan  HCT today and will wait to hear back from us  on an alternate bp medication she can use to replace Losartan . Routing to MD.

## 2023-05-06 ENCOUNTER — Other Ambulatory Visit: Payer: Self-pay | Admitting: Cardiology

## 2023-05-06 DIAGNOSIS — I10 Essential (primary) hypertension: Secondary | ICD-10-CM

## 2023-05-10 DIAGNOSIS — K648 Other hemorrhoids: Secondary | ICD-10-CM | POA: Diagnosis not present

## 2023-05-10 DIAGNOSIS — K635 Polyp of colon: Secondary | ICD-10-CM | POA: Diagnosis not present

## 2023-05-10 DIAGNOSIS — Z1211 Encounter for screening for malignant neoplasm of colon: Secondary | ICD-10-CM | POA: Diagnosis not present

## 2023-05-10 DIAGNOSIS — K5730 Diverticulosis of large intestine without perforation or abscess without bleeding: Secondary | ICD-10-CM | POA: Diagnosis not present

## 2023-05-11 DIAGNOSIS — I480 Paroxysmal atrial fibrillation: Secondary | ICD-10-CM | POA: Diagnosis not present

## 2023-05-11 DIAGNOSIS — R531 Weakness: Secondary | ICD-10-CM | POA: Diagnosis not present

## 2023-05-11 DIAGNOSIS — E785 Hyperlipidemia, unspecified: Secondary | ICD-10-CM | POA: Diagnosis not present

## 2023-05-11 DIAGNOSIS — E039 Hypothyroidism, unspecified: Secondary | ICD-10-CM | POA: Diagnosis not present

## 2023-05-12 DIAGNOSIS — K635 Polyp of colon: Secondary | ICD-10-CM | POA: Diagnosis not present

## 2023-05-13 ENCOUNTER — Other Ambulatory Visit: Payer: Self-pay | Admitting: Internal Medicine

## 2023-05-16 DIAGNOSIS — R82998 Other abnormal findings in urine: Secondary | ICD-10-CM | POA: Diagnosis not present

## 2023-05-18 DIAGNOSIS — I480 Paroxysmal atrial fibrillation: Secondary | ICD-10-CM | POA: Diagnosis not present

## 2023-05-18 DIAGNOSIS — E039 Hypothyroidism, unspecified: Secondary | ICD-10-CM | POA: Diagnosis not present

## 2023-05-18 DIAGNOSIS — Z23 Encounter for immunization: Secondary | ICD-10-CM | POA: Diagnosis not present

## 2023-05-18 DIAGNOSIS — Z1339 Encounter for screening examination for other mental health and behavioral disorders: Secondary | ICD-10-CM | POA: Diagnosis not present

## 2023-05-18 DIAGNOSIS — M4697 Unspecified inflammatory spondylopathy, lumbosacral region: Secondary | ICD-10-CM | POA: Diagnosis not present

## 2023-05-18 DIAGNOSIS — Z1331 Encounter for screening for depression: Secondary | ICD-10-CM | POA: Diagnosis not present

## 2023-05-18 DIAGNOSIS — G4733 Obstructive sleep apnea (adult) (pediatric): Secondary | ICD-10-CM | POA: Diagnosis not present

## 2023-05-18 DIAGNOSIS — E785 Hyperlipidemia, unspecified: Secondary | ICD-10-CM | POA: Diagnosis not present

## 2023-05-18 DIAGNOSIS — I10 Essential (primary) hypertension: Secondary | ICD-10-CM | POA: Diagnosis not present

## 2023-05-18 DIAGNOSIS — Z0000 Encounter for general adult medical examination without abnormal findings: Secondary | ICD-10-CM | POA: Diagnosis not present

## 2023-05-27 ENCOUNTER — Telehealth: Payer: Self-pay | Admitting: Cardiology

## 2023-05-27 ENCOUNTER — Other Ambulatory Visit: Payer: Self-pay

## 2023-05-27 MED ORDER — RIVAROXABAN 20 MG PO TABS: 20.0000 mg | ORAL_TABLET | Freq: Every evening | ORAL | 3 refills | Status: DC

## 2023-05-27 NOTE — Telephone Encounter (Signed)
*  STAT* If patient is at the pharmacy, call can be transferred to refill team.   1. Which medications need to be refilled? (please list name of each medication and dose if known)   rivaroxaban (XARELTO) 20 MG TABS tablet   2. Which pharmacy/location (including street and city if local pharmacy) is medication to be sent to? [id number] [place], [place] - [address] Phone: [phone]  Fax: [phone]     3. Do they need a 30 day or 90 day supply? [id number] Pharmacy has been trying to send this to [facility]

## 2023-06-27 DIAGNOSIS — N8182 Incompetence or weakening of pubocervical tissue: Secondary | ICD-10-CM | POA: Diagnosis not present

## 2023-07-21 DIAGNOSIS — J302 Other seasonal allergic rhinitis: Secondary | ICD-10-CM | POA: Diagnosis not present

## 2023-07-21 DIAGNOSIS — R051 Acute cough: Secondary | ICD-10-CM | POA: Diagnosis not present

## 2023-07-22 ENCOUNTER — Ambulatory Visit: Payer: Self-pay

## 2023-07-22 ENCOUNTER — Emergency Department (HOSPITAL_COMMUNITY)
Admission: EM | Admit: 2023-07-22 | Discharge: 2023-07-22 | Disposition: A | Discharge status: Home / Self Care | Attending: Emergency Medicine | Admitting: Emergency Medicine

## 2023-07-22 ENCOUNTER — Encounter (HOSPITAL_COMMUNITY): Payer: Self-pay

## 2023-07-22 ENCOUNTER — Other Ambulatory Visit: Payer: Self-pay

## 2023-07-22 ENCOUNTER — Emergency Department (HOSPITAL_COMMUNITY)

## 2023-07-22 DIAGNOSIS — R059 Cough, unspecified: Secondary | ICD-10-CM | POA: Insufficient documentation

## 2023-07-22 DIAGNOSIS — D72829 Elevated white blood cell count, unspecified: Secondary | ICD-10-CM | POA: Insufficient documentation

## 2023-07-22 DIAGNOSIS — E039 Hypothyroidism, unspecified: Secondary | ICD-10-CM | POA: Diagnosis not present

## 2023-07-22 DIAGNOSIS — I10 Essential (primary) hypertension: Secondary | ICD-10-CM | POA: Insufficient documentation

## 2023-07-22 DIAGNOSIS — R0602 Shortness of breath: Secondary | ICD-10-CM | POA: Diagnosis not present

## 2023-07-22 DIAGNOSIS — Z79899 Other long term (current) drug therapy: Secondary | ICD-10-CM | POA: Insufficient documentation

## 2023-07-22 DIAGNOSIS — R2243 Localized swelling, mass and lump, lower limb, bilateral: Secondary | ICD-10-CM | POA: Insufficient documentation

## 2023-07-22 DIAGNOSIS — R0989 Other specified symptoms and signs involving the circulatory and respiratory systems: Secondary | ICD-10-CM | POA: Diagnosis not present

## 2023-07-22 DIAGNOSIS — I517 Cardiomegaly: Secondary | ICD-10-CM | POA: Diagnosis not present

## 2023-07-22 DIAGNOSIS — J40 Bronchitis, not specified as acute or chronic: Secondary | ICD-10-CM | POA: Diagnosis not present

## 2023-07-22 DIAGNOSIS — Z7901 Long term (current) use of anticoagulants: Secondary | ICD-10-CM | POA: Diagnosis not present

## 2023-07-22 LAB — COMPREHENSIVE METABOLIC PANEL
ALT: 30 U/L (ref 0–44)
AST: 31 U/L (ref 15–41)
Albumin: 3.7 g/dL (ref 3.5–5.0)
Alkaline Phosphatase: 50 U/L (ref 38–126)
Anion gap: 9 (ref 5–15)
BUN: 17 mg/dL (ref 8–23)
CO2: 25 mmol/L (ref 22–32)
Calcium: 9.1 mg/dL (ref 8.9–10.3)
Chloride: 102 mmol/L (ref 98–111)
Creatinine, Ser: 0.78 mg/dL (ref 0.44–1.00)
GFR, Estimated: >60 mL/min (ref >60)
Glucose, Bld: 128 mg/dL — ABNORMAL HIGH (ref 70–99)
Potassium: 3.9 mmol/L (ref 3.5–5.1)
Sodium: 136 mmol/L (ref 135–145)
Total Bilirubin: 0.9 mg/dL (ref 0.0–1.2)
Total Protein: 7.4 g/dL (ref 6.5–8.1)

## 2023-07-22 LAB — CBC WITH DIFFERENTIAL/PLATELET
Abs Immature Granulocytes: 0.06 10*3/uL (ref 0.00–0.07)
Basophils Absolute: 0.1 10*3/uL (ref 0.0–0.1)
Basophils Relative: 1 %
Eosinophils Absolute: 0.2 10*3/uL (ref 0.0–0.5)
Eosinophils Relative: 2 %
HCT: 42 % (ref 36.0–46.0)
Hemoglobin: 13.4 g/dL (ref 12.0–15.0)
Immature Granulocytes: 1 %
Lymphocytes Relative: 10 %
Lymphs Abs: 1 10*3/uL (ref 0.7–4.0)
MCH: 30.9 pg (ref 26.0–34.0)
MCHC: 31.9 g/dL (ref 30.0–36.0)
MCV: 97 fL (ref 80.0–100.0)
Monocytes Absolute: 0.8 10*3/uL (ref 0.1–1.0)
Monocytes Relative: 8 %
Neutro Abs: 8.7 10*3/uL — ABNORMAL HIGH (ref 1.7–7.7)
Neutrophils Relative %: 78 %
Platelets: 163 10*3/uL (ref 150–400)
RBC: 4.33 MIL/uL (ref 3.87–5.11)
RDW: 13.2 % (ref 11.5–15.5)
WBC: 10.9 10*3/uL — ABNORMAL HIGH (ref 4.0–10.5)
nRBC: 0 % (ref 0.0–0.2)

## 2023-07-22 LAB — TROPONIN I (HIGH SENSITIVITY): Troponin I (High Sensitivity): 11 ng/L (ref <18)

## 2023-07-22 LAB — RESP PANEL BY RT-PCR (RSV, FLU A&B, COVID)  RVPGX2
Influenza A by PCR: NEGATIVE
Influenza B by PCR: NEGATIVE
Resp Syncytial Virus by PCR: NEGATIVE
SARS Coronavirus 2 by RT PCR: NEGATIVE

## 2023-07-22 LAB — BRAIN NATRIURETIC PEPTIDE: B Natriuretic Peptide: 321.5 pg/mL — ABNORMAL HIGH (ref 0.0–100.0)

## 2023-07-22 MED ORDER — ALBUTEROL SULFATE HFA 108 (90 BASE) MCG/ACT IN AERS: 1.0000 | INHALATION_SPRAY | Freq: Four times a day (QID) | RESPIRATORY_TRACT | 0 refills | Status: DC | PRN

## 2023-07-22 MED ORDER — PREDNISONE 50 MG PO TABS: 50.0000 mg | ORAL_TABLET | Freq: Every day | ORAL | 0 refills | Status: AC

## 2023-07-22 MED ORDER — IPRATROPIUM-ALBUTEROL 0.5-2.5 (3) MG/3ML IN SOLN
3.0000 mL | Freq: Once | RESPIRATORY_TRACT | Status: AC
  Administered 2023-07-22: 3 mL via RESPIRATORY_TRACT
  Filled 2023-07-22: qty 3

## 2023-07-22 NOTE — Discharge Instructions (Signed)
 We evaluated you for your cough and shortness of breath.  Your symptoms are most likely due to bronchitis.  Your symptoms improved in the emergency department with a breathing treatment.  We have prescribed you an inhaler and a short course of steroids.  Please be sure to follow-up closely with your primary doctor.  If you have any new or worsening symptoms such as worsening shortness of breath, fevers or chills, cough, chest pain, lightheadedness or dizziness, fainting, or any other new symptoms, please return to the emergency department for reassessment.

## 2023-07-22 NOTE — ED Provider Notes (Signed)
 [place] EMERGENCY DEPARTMENT AT [facility] Provider Note  CSN: [id number] Arrival date & time: 07/22/23 7829  Chief Complaint(s) Shortness of Breath  HPI [name] [name] is a 73 y.o. female history of atrial flutter on Xarelto, hypertension, hypothyroidism presents to the emergency department shortness of breath.  Patient reports cough starting around 2 weeks ago, past couple days having worsening cough, slight phlegm production, no fevers or chills.  Reports today having shortness of breath and wheezing.  No history of asthma or COPD.  Saw primary doctor yesterday, had prescriptions for Mucinex, Flonase without significant improvement.  Reports some chronic unchanged leg swelling.  No recent travel or surgeries.  No chest pain, abdominal pain, nausea, vomiting.  No back pain.   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   Exogenous obesity    Fibromyalgia    Hyperlipidemia    Hypothyroidism    Morbid obesity (HCC)    OSA (obstructive sleep apnea)    uses CPAP   Palpitations 07/13/2018   Paroxysmal atrial flutter (HCC)    Periodic limb movement disorder    REM sleep behavior disorder    Rhinitis    SOB (shortness of breath)    Spinal stenosis    Unspecified hypothyroidism    Patient Active Problem List   Diagnosis Date Noted   Varicose veins of both lower extremities 05/04/2022   Vitamin D deficiency 05/04/2022   Essential hypertension 02/15/2022   Mixed hyperlipidemia 02/15/2022   Female bladder prolapse 01/28/2022   Pre-diabetes 01/28/2022   Prediabetes 12/29/2021   Atrial flutter (HCC) 12/29/2021   Spinal stenosis 12/29/2021   Osteoarthritis of left knee 12/29/2021   Atypical atrial flutter (HCC) 02/25/2020   Palpitations 07/13/2018   Laboratory examination 07/13/2018   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 Authorizing Provider  albuterol (VENTOLIN HFA) 108 (90 Base) MCG/ACT inhaler Inhale 1 puff into the lungs every 6 (six) hours as needed for wheezing or shortness of breath. 07/22/23  Yes Lonell Grandchild, MD  predniSONE (DELTASONE) 50 MG tablet Take 1 tablet (50 mg total) by mouth daily for 5 days. 07/22/23 07/27/23 Yes Lonell Grandchild, MD  acetaminophen (TYLENOL) 500 MG tablet Take 500-1,000 mg by mouth every 6 (six) hours as needed (for pain.).    [provider]  amLODipine (NORVASC) 10 MG tablet Take 5 mg by mouth daily.    [provider]  Ascorbic Acid (VITAMIN C PO) Take by mouth. Patient not taking: Reported on 04/21/2023    [provider]  Cholecalciferol (VITAMIN D-3) 125 MCG (5000 UT) TABS Take 5,000 mg by mouth daily.    [provider]  flecainide (TAMBOCOR) 100 MG tablet Take 1 tablet (100 mg total) by mouth 2 (two) times daily. 04/21/23   Yates Decamp, MD  hydrALAZINE (APRESOLINE) 25 MG tablet TAKE 1 TABLET (25 MG TOTAL) BY MOUTH 3 (THREE) TIMES DAILY AS NEEDED (FOR SYSTOLIC BP >145 MMHG). Patient taking differently: Take 25 mg  by mouth 3 (three) times daily. 01/07/23   Yates Decamp, MD  levothyroxine (SYNTHROID) 175 MCG tablet Take 175 mcg by mouth daily. 09/17/18   [provider]  losartan-hydrochlorothiazide (HYZAAR) 50-12.5 MG tablet TAKE 1 TABLET BY MOUTH EVERY DAY 05/06/23   Yates Decamp, MD  metoprolol tartrate (LOPRESSOR) 25 MG tablet Take 1 tablet (25 mg total) by mouth 3 (three) times daily. 01/06/23   Yates Decamp, MD  rivaroxaban (XARELTO) 20 MG TABS tablet Take 1  tablet (20 mg total) by mouth every evening. 05/27/23   Yates Decamp, MD  rosuvastatin (CRESTOR) 10 MG tablet TAKE ONE HALF TABLET BY MOUTH ONCE A DAY. Patient taking differently: Take 5 tablets by mouth daily. TAKE ONE HALF TABLET BY MOUTH ONCE A DAY. 07/22/21   Cantwell, Celeste C, PA-C  VITAMIN B COMPLEX-C PO Take 1 capsule by mouth daily.    [provider]                                                                                                                                    Past Surgical History Past Surgical History:  Procedure Laterality Date   CARDIOVERSION N/A 04/29/2015   Procedure: CARDIOVERSION;  Surgeon: Yates Decamp, MD;  Location: [facility] ENDOSCOPY;  Service: Cardiovascular;  Laterality: N/A;   CARDIOVERSION N/A 02/26/2020   Procedure: CARDIOVERSION;  Surgeon: Yates Decamp, MD;  Location: [facility] ENDOSCOPY;  Service: Cardiovascular;  Laterality: N/A;   CARDIOVERSION N/A 03/11/2020   Procedure: CARDIOVERSION;  Surgeon: Yates Decamp, MD;  Location: [facility] ENDOSCOPY;  Service: Cardiovascular;  Laterality: N/A;   CARPAL TUNNEL RELEASE     Bilateral   DILATION AND CURETTAGE OF UTERUS     left rotator cuff     resection uterine polyp  2011   Family History Family History  Adopted: Yes  Problem Relation Age of Onset   Cervical cancer Mother     Social History Social History   Tobacco Use   Smoking status: Never   Smokeless tobacco: Never  Vaping Use   Vaping status: Never Used  Substance Use Topics   Alcohol use: No   Drug use: No   Allergies Aspirin, Azithromycin, Loratadine, Macrobid [nitrofurantoin], Sulfa antibiotics, Ceftin [cefuroxime], Codeine, and Morphine  Review of Systems Review of Systems  All other systems reviewed and are negative.   Physical Exam Vital Signs  I have reviewed the triage vital signs BP (!) 143/83   Pulse 63   Temp (!) 97.5 F (36.4 C) (Oral)   Resp 19   Ht 5\' 6"  (1.676 m)   SpO2 93%   BMI 40.84 kg/m  Physical Exam Vitals and  nursing note reviewed.  Constitutional:      General: She is not in acute distress.    Appearance: She is well-developed.  HENT:     Head: Normocephalic and atraumatic.     Mouth/Throat:     Mouth: Mucous membranes are moist.  Eyes:     Pupils: Pupils are equal, round, and reactive to light.  Neck:     Vascular: No JVD.  Cardiovascular:     Rate and Rhythm: Normal rate and regular rhythm.     Heart sounds: No murmur heard. Pulmonary:     Effort: Pulmonary effort is normal. No respiratory distress.     Breath sounds: Wheezing (diffuse mild wheezing) present. No rales.  Abdominal:     General: Abdomen is flat.     Palpations: Abdomen is soft.     Tenderness: There is no abdominal tenderness.  Musculoskeletal:        General: No tenderness.     Right lower leg: Edema present.     Left lower leg: Edema present.  Skin:    General: Skin is warm and dry.  Neurological:     General: No focal deficit present.     Mental Status: She is alert. Mental status is at baseline.  Psychiatric:        Mood and Affect: Mood normal.        Behavior: Behavior normal.     ED Results and Treatments Labs (all labs ordered are listed, but only abnormal results are displayed) Labs Reviewed  COMPREHENSIVE METABOLIC PANEL - Abnormal; Notable for the following components:      Result Value   Glucose, Bld 128 (*)    All other components within normal limits  CBC WITH DIFFERENTIAL/PLATELET - Abnormal; Notable for the following components:   WBC 10.9 (*)    Neutro Abs 8.7 (*)    All other components within normal limits  BRAIN NATRIURETIC PEPTIDE - Abnormal; Notable for the following components:   B Natriuretic Peptide 321.5 (*)    All other components within normal limits  RESP PANEL BY RT-PCR (RSV, FLU A&B, COVID)  RVPGX2  TROPONIN I (HIGH SENSITIVITY)                                                                                                                          Radiology DG Chest  Port 1 View Result Date: 07/22/2023 CLINICAL DATA:  Shortness of breath. EXAM: PORTABLE CHEST 1 VIEW COMPARISON:  April 30, 2015. FINDINGS: Stable cardiomegaly with mild central pulmonary vascular congestion. No consolidative process is noted. The visualized skeletal structures are unremarkable. IMPRESSION: Stable cardiomegaly with mild central pulmonary vascular congestion. Electronically Signed   By: Lupita Raider M.D.   On: 07/22/2023 09:57    Pertinent labs & imaging results that were available during my care of the patient were reviewed by me and considered in my medical decision making (see MDM for details).  Medications Ordered in ED Medications  ipratropium-albuterol (DUONEB) 0.5-2.5 (3) MG/3ML nebulizer solution 3 mL (3 mLs Nebulization Given 07/22/23 1001)  Procedures Procedures  (including critical care time)  Medical Decision Making / ED Course   MDM:  73 year old presenting to the emergency department with shortness of breath.  Patient overall well-appearing, physical examination with some scattered wheezes diffusely, no respiratory distress.  Suspect most likely causes bronchitis given history of cough, wheezing, will trial breathing treatment and reassess.  Lower concern for pneumonia without focal findings, will check chest x-ray.  Patient does have some lower extremity swelling which she reports is chronic, no JVD, no crackles, lower concern for CHF but will check BNP and chest x-ray.  Will check viral swab although patient out of window for any antiviral given 2 weeks of cough.  Patient not having any chest pain but reported chest tightness in triage so we will check troponin and EKG very low concern for ACS.  Doubt pulmonary embolism given chronic anticoagulation and compliance with therapy.  Clinical Course as of 07/22/23 1252  Fri Jul 22, 2023  1248 Patient feels much better after receiving breathing treatment.  Does have mild elevation of BNP however clinically without crackles or JVD to suggest volume overload and symptoms have improved just with DuoNeb give improvement with breathing treatment will treat with steroids, inhaler.  Advise close follow-up with her primary physician. Will discharge patient to home. All questions answered. Patient comfortable with plan of discharge. Return precautions discussed with patient and specified on the after visit summary.  [WS]    Clinical Course User Index [WS] Suezanne Jacquet, Jerilee Field, MD     Additional history obtained: -Additional history obtained from family -External records from outside source obtained and reviewed including: Chart review including previous notes, labs, imaging, consultation notes including prior cardiology notes    Lab Tests: -I ordered, reviewed, and interpreted labs.   The pertinent results include:   Labs Reviewed  COMPREHENSIVE METABOLIC PANEL - Abnormal; Notable for the following components:      Result Value   Glucose, Bld 128 (*)    All other components within normal limits  CBC WITH DIFFERENTIAL/PLATELET - Abnormal; Notable for the following components:   WBC 10.9 (*)    Neutro Abs 8.7 (*)    All other components within normal limits  BRAIN NATRIURETIC PEPTIDE - Abnormal; Notable for the following components:   B Natriuretic Peptide 321.5 (*)    All other components within normal limits  RESP PANEL BY RT-PCR (RSV, FLU A&B, COVID)  RVPGX2  TROPONIN I (HIGH SENSITIVITY)    Notable for mild nonspecific BNP elevation  EKG   EKG Interpretation Date/Time:  Friday July 22 2023 12:31:30 EDT Ventricular Rate:  67 PR Interval:    QRS Duration:  101 QT Interval:  441 QTC Calculation: 466 R Axis:   65  Text Interpretation: Normal sinus rhythm Nonspecific T abnrm, anterolateral leads Confirmed by Alvino Blood ([id number]) on 07/22/2023 12:44:02  PM         Imaging Studies ordered: I ordered imaging studies including CXR On my interpretation imaging demonstrates no pneumonia I independently visualized and interpreted imaging. I agree with the radiologist interpretation   Medicines ordered and prescription drug management: Meds ordered this encounter  Medications   ipratropium-albuterol (DUONEB) 0.5-2.5 (3) MG/3ML nebulizer solution 3 mL   predniSONE (DELTASONE) 50 MG tablet    Sig: Take 1 tablet (50 mg total) by mouth daily for 5 days.    Dispense:  5 tablet    Refill:  0   albuterol (VENTOLIN HFA) 108 (90 Base) MCG/ACT inhaler  Sig: Inhale 1 puff into the lungs every 6 (six) hours as needed for wheezing or shortness of breath.    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    Final Clinical Impression(s) / ED Diagnoses Final diagnoses:  Bronchitis     This chart was dictated using voice recognition software.  Despite best efforts  to proofread,  errors can occur which can change the documentation meaning.    Lonell Grandchild, MD 07/22/23 [id number]

## 2023-07-22 NOTE — ED Triage Notes (Signed)
 Patient presented to ER for shortness of breath and tightness in chest. Went to PCP yesterday where they recommended Zyrtec, mucinex, and flonase which has not helped. Patient reports feeling much worse today.

## 2023-07-26 DIAGNOSIS — J209 Acute bronchitis, unspecified: Secondary | ICD-10-CM | POA: Diagnosis not present

## 2023-07-26 DIAGNOSIS — R051 Acute cough: Secondary | ICD-10-CM | POA: Diagnosis not present

## 2023-07-26 DIAGNOSIS — J302 Other seasonal allergic rhinitis: Secondary | ICD-10-CM | POA: Diagnosis not present

## 2023-08-23 ENCOUNTER — Other Ambulatory Visit: Payer: Self-pay

## 2023-08-23 MED ORDER — HYDRALAZINE HCL 25 MG PO TABS: ORAL_TABLET | ORAL | 2 refills | Status: AC

## 2023-08-29 ENCOUNTER — Other Ambulatory Visit: Payer: Self-pay

## 2023-08-29 DIAGNOSIS — I10 Essential (primary) hypertension: Secondary | ICD-10-CM

## 2023-08-29 MED ORDER — AMLODIPINE BESYLATE 10 MG PO TABS: 5.0000 mg | ORAL_TABLET | Freq: Every day | ORAL | 2 refills | Status: DC

## 2023-09-05 ENCOUNTER — Telehealth: Payer: Self-pay | Admitting: Cardiology

## 2023-09-05 MED ORDER — ROSUVASTATIN CALCIUM 10 MG PO TABS: ORAL_TABLET | ORAL | 2 refills | Status: DC

## 2023-09-05 NOTE — Telephone Encounter (Signed)
 RX sent to requested Pharmacy

## 2023-09-05 NOTE — Telephone Encounter (Signed)
 New message    *STAT* If patient is at the pharmacy, call can be transferred to refill team.   1. Which medications need to be refilled? (please list name of each medication and dose if known) rosuvastatin  10mg    2. Would you like to learn more about the convenience, safety, & potential cost savings by using the [facility]?    3. Are you open to using the [facility] (Type [facility]. ).   4. Which pharmacy/location (including street and city if local pharmacy) is medication to be sent to?[facility] [place]   )[address])   5. Do they need a 30 day or 90 day supply? 90

## 2023-09-07 DIAGNOSIS — N8182 Incompetence or weakening of pubocervical tissue: Secondary | ICD-10-CM | POA: Diagnosis not present

## 2023-09-19 DIAGNOSIS — H40033 Anatomical narrow angle, bilateral: Secondary | ICD-10-CM | POA: Diagnosis not present

## 2023-09-19 DIAGNOSIS — H2513 Age-related nuclear cataract, bilateral: Secondary | ICD-10-CM | POA: Diagnosis not present

## 2023-09-28 ENCOUNTER — Telehealth: Payer: Self-pay | Admitting: Cardiology

## 2023-09-28 ENCOUNTER — Other Ambulatory Visit: Payer: Self-pay | Admitting: *Deleted

## 2023-09-28 DIAGNOSIS — M7989 Other specified soft tissue disorders: Secondary | ICD-10-CM

## 2023-09-28 DIAGNOSIS — N762 Acute vulvitis: Secondary | ICD-10-CM | POA: Diagnosis not present

## 2023-09-28 DIAGNOSIS — R350 Frequency of micturition: Secondary | ICD-10-CM | POA: Diagnosis not present

## 2023-09-28 NOTE — Telephone Encounter (Signed)
 Spoke with Abe Abed at PCP office and she states patient has swelling in her hands, feet and ankles. No chest or SOB. She states she is just puffy. Patient was offered appointment next week with APP. She wants her to be seen sooner. Did inform her that is the first available. She would like to know if you can see her sooner. You are booked up

## 2023-09-28 NOTE — Telephone Encounter (Signed)
 Pt c/o swelling/edema: STAT if pt has developed SOB within 24 hours  If swelling, where is the swelling located?   Hands, feet, ankles  How much weight have you gained and in what time span?   Yes  Have you gained 2 pounds in a day or 5 pounds in a week?   Yes  Do you have a log of your daily weights (if so, list)?   No  Are you currently taking a fluid pill?   No  Are you currently SOB?   No  Have you traveled recently in a car or plane for an extended period of time?   Caller Abe Abed) stated patient is in their office and is having significant swelling in her hands, feet and ankles.

## 2023-09-28 NOTE — Telephone Encounter (Signed)
 Please call patient and see if she can wait. If her symptoms are ongoing and does not look emergent to me, we could wait until the next appointment in a week. Please order BNP and will need EKG  when she comes

## 2023-09-28 NOTE — Addendum Note (Signed)
 Addended by: Irine Heminger L on: 09/28/2023 04:27 PM   Modules accepted: Orders

## 2023-09-28 NOTE — Telephone Encounter (Signed)
 Dr Berry Bristol would like patient to have BNP prior to office visit.  Patient notified and will have lab work done

## 2023-09-28 NOTE — Telephone Encounter (Signed)
 Spoke with patient and she is aware per provider its ok to wait for next available appointment. She is scheduled for 6/9

## 2023-09-29 DIAGNOSIS — M7989 Other specified soft tissue disorders: Secondary | ICD-10-CM | POA: Diagnosis not present

## 2023-09-30 ENCOUNTER — Ambulatory Visit: Payer: Self-pay | Admitting: Cardiology

## 2023-09-30 LAB — PRO B NATRIURETIC PEPTIDE: NT-Pro BNP: 408 pg/mL — ABNORMAL HIGH (ref 0–301)

## 2023-09-30 MED ORDER — FUROSEMIDE 20 MG PO TABS: ORAL_TABLET | ORAL | 3 refills | Status: DC

## 2023-09-30 MED ORDER — POTASSIUM CHLORIDE ER 10 MEQ PO CPCR: ORAL_CAPSULE | ORAL | 3 refills | Status: DC

## 2023-09-30 NOTE — Progress Notes (Signed)
 BNP elevated, symptoms suggest acute on chronic diastolic heart failure. Please Rx Lasix  20 mg am and 3 pm for [redacted] week along with micro K 10 mEq BID .  You are showing signs of fluid retention (Congestive heart failure). Please be watchful of your diet. If weight gain > 3 Lbs, take extra dose of water pill (diuretic) for 1-2 days and if not corrected or having symptoms of shortness of breath or leg swelling, please call to be seen.  Dr. Berry Bristol

## 2023-09-30 NOTE — Progress Notes (Signed)
 BNP elevated, symptoms suggest acute on chronic diastolic heart failure. Please Rx Lasix  20 mg am and 3 pm for [redacted] week along with micro K 10 mEq BID.  If your weight is back to baseline after 5-6 days and you feel good, can reduce Lasix  to 1/day along with reducing potassium pill also to 1/day.

## 2023-10-05 ENCOUNTER — Telehealth: Payer: Self-pay | Admitting: Cardiology

## 2023-10-05 NOTE — Telephone Encounter (Signed)
 Will forward to Dr Berry Bristol so he will be aware.

## 2023-10-05 NOTE — Telephone Encounter (Signed)
 Patient called to report to Dr. Berry Bristol that she has lost 7-8 pounds of fluid weight and she has not had to take additional medication.  Patient canceled 6/9 visit.  Patient noted she will be seeing Dr. Cloretta Danes  tomorrow and he did removed the pessary and she has been urinating better.

## 2023-10-06 DIAGNOSIS — N8182 Incompetence or weakening of pubocervical tissue: Secondary | ICD-10-CM | POA: Diagnosis not present

## 2023-10-10 ENCOUNTER — Ambulatory Visit: Admitting: Cardiology

## 2023-11-03 DIAGNOSIS — N8182 Incompetence or weakening of pubocervical tissue: Secondary | ICD-10-CM | POA: Diagnosis not present

## 2023-11-09 DIAGNOSIS — I480 Paroxysmal atrial fibrillation: Secondary | ICD-10-CM | POA: Diagnosis not present

## 2023-11-09 DIAGNOSIS — G4733 Obstructive sleep apnea (adult) (pediatric): Secondary | ICD-10-CM | POA: Diagnosis not present

## 2023-11-09 DIAGNOSIS — Z6841 Body Mass Index (BMI) 40.0 and over, adult: Secondary | ICD-10-CM | POA: Diagnosis not present

## 2023-11-09 DIAGNOSIS — I872 Venous insufficiency (chronic) (peripheral): Secondary | ICD-10-CM | POA: Diagnosis not present

## 2023-11-09 DIAGNOSIS — I10 Essential (primary) hypertension: Secondary | ICD-10-CM | POA: Diagnosis not present

## 2023-11-09 DIAGNOSIS — E785 Hyperlipidemia, unspecified: Secondary | ICD-10-CM | POA: Diagnosis not present

## 2023-11-09 DIAGNOSIS — E039 Hypothyroidism, unspecified: Secondary | ICD-10-CM | POA: Diagnosis not present

## 2023-11-09 DIAGNOSIS — M4306 Spondylolysis, lumbar region: Secondary | ICD-10-CM | POA: Diagnosis not present

## 2023-12-02 ENCOUNTER — Telehealth: Payer: Self-pay | Admitting: Cardiology

## 2023-12-02 NOTE — Telephone Encounter (Signed)
 Pt c/o medication issue:  1. Name of Medication: furosemide  (LASIX ) 20 MG tablet   2. How are you currently taking this medication (dosage and times per day)? As  written   3. Are you having a reaction (difficulty breathing--STAT)? N/A  4. What is your medication issue? Pt was told to take meds as needed but she would like to speak to a nurse to get a better understanding.

## 2023-12-02 NOTE — Telephone Encounter (Signed)
 Patient states she has had swelling in her legs and feet since June, for which she started taking Lasix  20 mg as needed as was recommended at that time.  Patient states she has been taking Lasix  20 mg daily for the past few weeks because taking it once a week was not helping with the swelling. She reports the swelling has improved but she still have some puffiness in her ankles.  Patient denies any SOB. She reports her PCP recommended she wear knee-high compression stockings to help with swelling.  Patient would like to know if she needs to take something stronger to get rid of this remaining puffiness. Patient does report swelling goes away overnight and comes on throughout the day.  Will forward to Dr. Ladona to review and advise.

## 2023-12-02 NOTE — Telephone Encounter (Signed)
 Increase furosemide  to 40 mg daily for 1 week.  I would recommend that she continue with furosemide  20 mg on a daily basis instead of taking it once a week.  She needs to adjust the dosage depending upon her response may be she needs it on a long-term basis or every other day basis.  If this does not work then she can contact us .  She needs to understand that taking furosemide  for leg edema on a long-term basis would affect her kidney function, diet management, salt restriction, raising her legs when she is resting above her heart level would all help and also support stockings will help with leg edema.

## 2023-12-05 NOTE — Telephone Encounter (Signed)
 Patient notified

## 2023-12-07 NOTE — Telephone Encounter (Signed)
 Patient started taking Lasix  40 mg daily on Monday as recommended. She states when she took both tablets together she felt lightheaded. Now she takes 20 mg in the morning and 20 mg midday, and this has worked much better for her.  Patient is asking how much potassium should she be taking. She currently takes potassium 10 meq once daily. She is asking if this needs to be increased while taking increased dose of Lasix  over the next week.  Will forward to Dr. Ladona to review and advise.

## 2023-12-07 NOTE — Telephone Encounter (Signed)
 Spoke with patient and shared response from Dr. Ladona to increase potassium to 10 meq BID while taking increase dose of Lasix  for the week, then resume Lasix  20 mg daily and potassium 10 meq once daily.  Patient verbalized understanding and expressed appreciation for call.

## 2023-12-07 NOTE — Telephone Encounter (Signed)
 Pt called in to ask how should she be taking her potassium now that furosemide  has been increased. Please advise.

## 2023-12-26 ENCOUNTER — Telehealth: Payer: Self-pay | Admitting: Cardiology

## 2023-12-26 ENCOUNTER — Other Ambulatory Visit: Payer: Self-pay | Admitting: Cardiology

## 2023-12-26 NOTE — Telephone Encounter (Signed)
 Called and spoke to patient and patient verbalized she is ok. Patient reports shortness of breath during midnight. Patient reports fatigue without activities. Denies chest pain and reports not checking blood pressure. [id number]. 58 today. Denies dizziness, lightheadedness. Patient request appt and appt made to see Dr. . Ladona 8/27 @ 9:00. Ed precautions reviewed and patient made aware. Understanding verbalized.

## 2023-12-26 NOTE — Telephone Encounter (Signed)
 Patient c/o Palpitations:  STAT if patient reporting lightheadedness, shortness of breath, or chest pain  How long have you had palpitations/irregular HR/ Afib? Are you having the symptoms now? 2-3 days, yes  Are you currently experiencing lightheadedness, SOB or CP? no  Do you have a history of afib (atrial fibrillation) or irregular heart rhythm? Atrial Flutter  Have you checked your BP or HR? (document readings if available): has not checked it  Are you experiencing any other symptoms? Fatigue,sob

## 2023-12-27 ENCOUNTER — Other Ambulatory Visit: Payer: Self-pay | Admitting: Cardiology

## 2023-12-28 ENCOUNTER — Ambulatory Visit: Attending: Cardiology | Admitting: Cardiology

## 2023-12-28 ENCOUNTER — Encounter: Payer: Self-pay | Admitting: Cardiology

## 2023-12-28 VITALS — BP 138/62 | HR 55 | Resp 16 | Ht 66.0 in | Wt 258.0 lb

## 2023-12-28 DIAGNOSIS — I5032 Chronic diastolic (congestive) heart failure: Secondary | ICD-10-CM

## 2023-12-28 DIAGNOSIS — Z6841 Body Mass Index (BMI) 40.0 and over, adult: Secondary | ICD-10-CM | POA: Diagnosis not present

## 2023-12-28 DIAGNOSIS — I4892 Unspecified atrial flutter: Secondary | ICD-10-CM

## 2023-12-28 DIAGNOSIS — E66813 Obesity, class 3: Secondary | ICD-10-CM | POA: Diagnosis not present

## 2023-12-28 DIAGNOSIS — I10 Essential (primary) hypertension: Secondary | ICD-10-CM

## 2023-12-28 LAB — CBC

## 2023-12-28 NOTE — Patient Instructions (Signed)
 Medication Instructions:  Your physician recommends that you continue on your current medications as directed. Please refer to the Current Medication list given to you today.  *If you need a refill on your cardiac medications before your next appointment, please call your pharmacy*  Lab Work: none If you have labs (blood work) drawn today and your tests are completely normal, you will receive your results only by: MyChart Message (if you have MyChart) OR A paper copy in the mail If you have any lab test that is abnormal or we need to change your treatment, we will call you to review the results.  Testing/Procedures: none  Follow-Up: At [facility], you and your health needs are our priority.  As part of our continuing mission to provide you with exceptional heart care, our providers are all part of one team.  This team includes your primary Cardiologist (physician) and Advanced Practice Providers or APPs (Physician Assistants and Nurse Practitioners) who all work together to provide you with the care you need, when you need it.  Your next appointment:   12 month(s)  Provider:   Gordy Bergamo, MD    We recommend signing up for the patient portal called MyChart.  Sign up information is provided on this After Visit Summary.  MyChart is used to connect with patients for Virtual Visits (Telemedicine).  Patients are able to view lab/test results, encounter notes, upcoming appointments, etc.  Non-urgent messages can be sent to your provider as well.   To learn more about what you can do with MyChart, go to [email or web address].   Other Instructions

## 2023-12-28 NOTE — Progress Notes (Signed)
 Cardiology Office Note:  .   Date:  12/28/2023  ID:  [name] [name], DOB Mar 25, 1951, MRN [id number] PCP: Shepard Ade, MD  [facility] Providers Cardiologist:  Gordy Bergamo, MD   History of Present Illness: .   [name] [name] is a 73 y.o.   female  with paroxysmal atypical atrial flutter on long-term flecainide , hyperlipidemia, hyperglycemia, morbid obesity and hypertension with stage IIIa chronic kidney disease made appointment to see me due to worsening dyspnea and fatigue.  I had advised her to increase her furosemide  to be taken twice daily dose for a week and then to reduce it to as needed basis that she normally does.  Patient states that since telephone call 3 weeks ago, symptoms have resolved and she is back to baseline.  Denies any further leg edema, dyspnea stable and back to baseline.  Cardiac Studies relevent.    Sleep study. 05/28/2015 No e/o sleep apnea.    Echocardiogram 02/19/2020: Normal LV systolic function with EF 58%. Left ventricle cavity is normal in size. Normal global wall motion. Indeterminate diastolic filling pattern. Calculated EF 58%. Left atrial cavity is mildly dilated.    Discussed the use of AI scribe software for clinical note transcription with the patient, who gave verbal consent to proceed.  History of Present Illness [name] [name] is a 73 year old female with heart failure who presents with shortness of breath and leg swelling.  She experiences shortness of breath and swelling in her feet and legs, which have improved since the initial onset. There is an associated increase in weight, likely due to the swelling. She has been taking furosemide  as needed, initially twice daily for a week and a half, then reducing to once a week, with no recent doses. She also takes a potassium supplement with furosemide .  Her past medical history includes heart failure with a previously elevated BNP level of 408 in May. She is on blood thinners and medications  including amlodipine , losartan  HCT, and metoprolol . Her ankles remain puffy, and she does not weigh herself daily but does so regularly. She maintains an active lifestyle with walking five days a week and water aerobics on Saturdays.   Labs   Lab Results  Component Value Date   CHOL 137 06/01/2022   HDL 48 06/01/2022   LDLCALC 75 06/01/2022   TRIG 68 06/01/2022    Recent Labs    07/22/23 1033  NA 136  K 3.9  CL 102  CO2 25  GLUCOSE 128*  BUN 17  CREATININE 0.78  CALCIUM  9.1  GFRNONAA >60    Lab Results  Component Value Date   ALT 30 07/22/2023   AST 31 07/22/2023   ALKPHOS 50 07/22/2023   BILITOT 0.9 07/22/2023      Latest Ref Rng & Units 07/22/2023   10:33 AM 06/01/2022   12:00 AM 03/11/2020   10:18 AM  CBC  WBC 4.0 - 10.5 K/uL 10.9  6.4       Hemoglobin 12.0 - 15.0 g/dL 86.5  86.6     86.0   Hematocrit 36.0 - 46.0 % 42.0  41     41.0   Platelets 150 - 400 K/uL 163        This result is from an external source.   Lab Results  Component Value Date   HGBA1C 5.7 (H) 12/15/2021    Lab Results  Component Value Date   TSH 4.93 06/01/2022    BNP (last 3 results) Recent  Labs    07/22/23 1033  BNP 321.5*    ProBNP (last 3 results) Recent Labs    09/29/23 1511  PROBNP 408*    ROS  Review of Systems  Cardiovascular:  Positive for dyspnea on exertion (Stable and improved) and leg swelling (Occasional ankle.). Negative for chest pain.   Physical Exam:   VS:  BP 138/62 (BP Location: Left Arm, Patient Position: Sitting, Cuff Size: Large)   Pulse (!) 55   Resp 16   Ht 5' 6 (1.676 m)   Wt 258 lb (117 kg)   SpO2 94%   BMI 41.64 kg/m    Wt Readings from Last 3 Encounters:  12/28/23 258 lb (117 kg)  04/21/23 253 lb (114.8 kg)  06/30/22 249 lb (112.9 kg)    BP Readings from Last 3 Encounters:  12/28/23 138/62  07/22/23 139/69  04/21/23 (!) 142/70   Physical Exam Constitutional:      Appearance: She is morbidly obese.  Neck:     Vascular: No  carotid bruit or JVD.  Cardiovascular:     Rate and Rhythm: Normal rate and regular rhythm.     Pulses: Intact distal pulses.     Heart sounds: Normal heart sounds. No murmur heard.    No gallop.  Pulmonary:     Effort: Pulmonary effort is normal.     Breath sounds: Normal breath sounds.  Abdominal:     General: Bowel sounds are normal.     Palpations: Abdomen is soft.  Musculoskeletal:     Right lower leg: No edema.     Left lower leg: No edema.    EKG:    EKG Interpretation Date/Time:  Wednesday December 28 2023 08:58:37 EDT Ventricular Rate:  56 PR Interval:  256 QRS Duration:  106 QT Interval:  458 QTC Calculation: 441 R Axis:   -8  Text Interpretation: EKG 12/28/2023: Sinus rhythm with first-degree AV block at rate of 56 bpm otherwise normal EKG.  Compared to 07/22/2023, no significant change. Confirmed by Markeem Noreen, Jagadeesh ([id number]) on 12/28/2023 9:08:46 AM    ASSESSMENT AND PLAN: .      ICD-10-CM   1. Chronic diastolic heart failure (HCC)  P49.67 EKG 12-Lead    CBC    Basic Metabolic Panel (BMET)    2. Paroxysmal atrial flutter (HCC)  I48.92 CBC    Basic Metabolic Panel (BMET)    3. Essential hypertension  I10 CBC    Basic Metabolic Panel (BMET)    4. Class 3 severe obesity due to excess calories with serious comorbidity and body mass index (BMI) of 40.0 to 44.9 in adult  E66.813 CBC   Z68.41 Basic Metabolic Panel (BMET)     Assessment & Plan Chronic heart failure Recent episode of heart failure with pulmonary edema, likely due to dietary sodium intake. Symptoms included dyspnea and peripheral edema. Elevated BNP in May indicated fluid retention. Symptoms resolved with diuretic therapy. Current management focuses on monitoring and prevention of future episodes. - Instruct to weigh daily. - Advise to take furosemide  if weight increases by 3 pounds in 3 days. - Educate on fluid restriction when taking furosemide . - Advise to monitor for symptoms of fluid  accumulation.  Obesity Obesity contributing to heart failure symptoms. Discussed potential benefits of weight loss medications such as GLP-1 agonists (Wegovy, Mounjaro), which are considered safe and effective. She is hesitant about medication but open to considering it in the future. Emphasized potential for significant weight loss and improved well-being with these  medications. - Discuss GLP-1 agonists as a weight loss option if interested. - Advise to maintain current physical activity regimen.  Paroxysmal atypical atrial flutter Atrial flutter is well-managed with current medication regimen, presently on flecainide  100 mg twice daily and metoprolol  25 mg 3 times daily. - Obtain CBC and BMP for baseline as it has been >6 months ago she had labs.  Hypertension Blood pressure is well-controlled with current medications. Continue current medications: Xarelto , amlodipine , losartan  HCT,   Follow-Up Follow-up plan discussed to ensure ongoing management of conditions. - Order blood work to monitor effects of blood thinners. - Schedule follow-up appointment in one year unless symptoms change.  Follow up: 1 year for follow-up of atypical atrial flutter and hypertension and heart failure.  Signed,  Gordy Bergamo, MD, [facility] 12/28/2023, 11:25 AM [facility] [address] [place], [place] [address] Phone: [phone]. Fax:  [phone]

## 2023-12-29 ENCOUNTER — Ambulatory Visit: Payer: Self-pay | Admitting: Cardiology

## 2023-12-29 ENCOUNTER — Telehealth: Payer: Self-pay | Admitting: Cardiology

## 2023-12-29 LAB — BASIC METABOLIC PANEL WITH GFR
BUN/Creatinine Ratio: 20 (ref 12–28)
BUN: 18 mg/dL (ref 8–27)
CO2: 22 mmol/L (ref 20–29)
Calcium: 9.4 mg/dL (ref 8.7–10.3)
Chloride: 102 mmol/L (ref 96–106)
Creatinine, Ser: 0.88 mg/dL (ref 0.57–1.00)
Glucose: 104 mg/dL — ABNORMAL HIGH (ref 70–99)
Potassium: 4.2 mmol/L (ref 3.5–5.2)
Sodium: 140 mmol/L (ref 134–144)
eGFR: 69 mL/min/1.73 (ref >59)

## 2023-12-29 LAB — CBC
Hematocrit: 40.7 (ref 34.0–46.6)
Hemoglobin: 13.3 g/dL (ref 11.1–15.9)
MCH: 30.6 pg (ref 26.6–33.0)
MCHC: 32.7 g/dL (ref 31.5–35.7)
MCV: 94 fL (ref 79–97)
Platelets: 189 x10E3/uL (ref 150–450)
RBC: 4.34 x10E6/uL (ref 3.77–5.28)
RDW: 12.1 (ref 11.7–15.4)
WBC: 8.8 x10E3/uL (ref 3.4–10.8)

## 2023-12-29 NOTE — Progress Notes (Signed)
 Normal labs including kidney function, potassium and normal blood counts

## 2023-12-29 NOTE — Telephone Encounter (Signed)
 Pt c/o medication issue:  1. Name of Medication: Weight loss medication  2. How are you currently taking this medication (dosage and times per day)? NA  3. Are you having a reaction (difficulty breathing--STAT)? No  4. What is your medication issue? Pt is unable to start on a weight loss medication due to not being diabetic and spoke with pharmacy about possible coverage determination and would like a c/b about seeing if there is another way for this medication to be covered by insurance for her.

## 2024-01-03 NOTE — Telephone Encounter (Signed)
 Spoke with patient, doesn't meed Medicare qualifications, would like to consider [employer].  Scheduled appt with Melissa Maccia for Sept 30

## 2024-01-12 ENCOUNTER — Telehealth: Payer: Self-pay

## 2024-01-12 DIAGNOSIS — I10 Essential (primary) hypertension: Secondary | ICD-10-CM

## 2024-01-12 MED ORDER — AMLODIPINE BESYLATE 5 MG PO TABS: 5.0000 mg | ORAL_TABLET | Freq: Every day | ORAL | 3 refills | Status: AC

## 2024-01-12 NOTE — Telephone Encounter (Signed)
 [facility] pharmacy is stating that pt prefers Amlodipine  5 mg tablets, so pt will not have to cut tablets in half. Would Dr. Ladona like to prescribe amlodipine  5 mg tablets daily, instead of amlodipine  10 mg tablets 1/2 daily? Please address

## 2024-01-12 NOTE — Telephone Encounter (Signed)
 New prescription for amlodipine  5 mg tablets sent to pharmacy per Dr. Ladona.

## 2024-01-12 NOTE — Telephone Encounter (Signed)
Yes 90 with 3 refills 

## 2024-01-12 NOTE — Telephone Encounter (Signed)
 Called and spoke with patient. Advised her that prescription for amlodipine  5 mg tablets were sent to pharmacy. Patient verbalized understanding states she will pick up medication tomorrow.

## 2024-01-17 ENCOUNTER — Encounter: Payer: Self-pay | Admitting: Cardiology

## 2024-01-17 ENCOUNTER — Telehealth: Payer: Self-pay

## 2024-01-17 NOTE — Telephone Encounter (Signed)
   Pre-operative Risk Assessment    Patient Name: [name] [name]  DOB: 08-24-1950 MRN: [id number]   Date of last office visit: 12/28/23 Date of next office visit: 01/31/24   Request for Surgical Clearance    Procedure:  L4-L5 ESI  Date of Surgery:  Clearance TBD                                Surgeon:  Dr. Deatrice Manus Surgeon's Group or Practice Name:  [facility] Phone number:  [phone] Fax number:  [phone]   Type of Clearance Requested:   - Medical  - Pharmacy:  Hold Rivaroxaban  (Xarelto ) x3 days after   Type of Anesthesia:  Not Indicated   Additional requests/questions:    Bonney Ival LOISE Gerome   01/17/2024, 11:44 AM

## 2024-01-17 NOTE — Telephone Encounter (Signed)
 Pre op letter sent for holding Xarelto  for [facility]

## 2024-01-23 NOTE — Telephone Encounter (Signed)
 Holding was addressed in Dr. Ladona letter

## 2024-01-31 ENCOUNTER — Ambulatory Visit: Attending: Cardiology | Admitting: Pharmacist

## 2024-01-31 DIAGNOSIS — M48062 Spinal stenosis, lumbar region with neurogenic claudication: Secondary | ICD-10-CM | POA: Diagnosis not present

## 2024-01-31 NOTE — Progress Notes (Signed)
 Patient ID: [name] [name]                 DOB: Jul 03, 1950                    MRN: [id number]     HPI: [name] [name] is a 73 y.o. female patient referred to pharmacy clinic by Dr. Ladona to initiate GLP1-RA therapy. PMH is significant for afib, HLD, HTN, CHF, and obesity. Most recent BMI 41 kg/m .  Patient presents today to discuss GLP-1 therapy. Unfortunately her insurance will not cover GLP-1 as she does not have any indications that they cover. She cannot afford cash price.   Diet: breakfast: scrambled egg and sausage Drink: coffee w/ creamer, water Doesn't care for sweets Lunch: yogurt or fruit Dinner: salads w/ grilled chicken, fish (fried), fried chicken w/ beans and rice   Exercise: walks 5 days a week (when her back isnt hurting) water areobics 1-2 times a week  Family History:  Family History  Adopted: Yes  Problem Relation Age of Onset   Cervical cancer Mother     Social History: no ETOH, no tobacco  Labs: Lab Results  Component Value Date   HGBA1C 5.7 (H) 12/15/2021    Wt Readings from Last 1 Encounters:  12/28/23 258 lb (117 kg)    BP Readings from Last 1 Encounters:  12/28/23 138/62   Pulse Readings from Last 1 Encounters:  12/28/23 (!) 55       Component Value Date/Time   CHOL 137 06/01/2022 0000   CHOL 129 11/14/2018 0830   TRIG 68 06/01/2022 0000   HDL 48 06/01/2022 0000   HDL 49 11/14/2018 0830   LDLCALC 75 06/01/2022 0000   LDLCALC 63 11/14/2018 0830    Past Medical History:  Diagnosis Date   Anxiety and depression    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     amLODipine  (NORVASC ) 5 MG tablet Take 1 tablet (5 mg total) by mouth daily. 90 tablet 3   Cholecalciferol (VITAMIN D -3) 125 MCG (5000 UT) TABS Take 5,000 mg by mouth daily.     flecainide  (TAMBOCOR ) 100 MG tablet Take 1 tablet (100 mg total) by mouth 2 (two) times daily. 180 tablet 3   furosemide  (LASIX ) 20 MG tablet TAKE 1 TABLET BY MOUTH DAILY AS NEEDED FOR SWELLING SHORTNESS OF BREATH OR WEIGHT GAIN OVER 3 LBS 90 tablet 3   hydrALAZINE  (APRESOLINE ) 25 MG tablet TAKE 1 TABLET (25 MG TOTAL) BY MOUTH 3 (THREE) TIMES DAILY AS NEEDED (FOR SYSTOLIC BP >145 MMHG). 270 tablet 2   levothyroxine (SYNTHROID) 175 MCG tablet Take 175 mcg by mouth daily.     losartan -hydrochlorothiazide  (HYZAAR) 50-12.5 MG tablet TAKE 1 TABLET BY MOUTH EVERY DAY 90 tablet 30   metoprolol  tartrate (LOPRESSOR ) 25 MG tablet TAKE 1 TABLET BY MOUTH THREE TIMES A DAY 270  tablet 3   potassium chloride  (MICRO-K ) 10 MEQ CR capsule TAKE ONE TABLET BY MOUTH DAILY AS NEEDED --TAKE ON THE DAYS WHEN TAKING FUROSEMIDE  90 capsule 3   rivaroxaban  (XARELTO ) 20 MG TABS tablet Take 1 tablet (20 mg total) by mouth every evening. 90 tablet 3   rosuvastatin  (CRESTOR ) 10 MG tablet TAKE ONE HALF TABLET BY MOUTH ONCE A DAY. 45 tablet 2   No current facility-administered medications on file prior to visit.    Allergies  Allergen Reactions   Aspirin Other (See Comments)   Azithromycin Other (See Comments)    unknown   Loratadine Other (See Comments)   Macrobid  [Nitrofurantoin ] Hives   Sulfa  Antibiotics     Other reaction(s): Unknown   Ceftin  [Cefuroxime ] Itching and Rash   Codeine Nausea And Vomiting   Morphine Nausea And Vomiting     Assessment/Plan:  1. Weight loss - Patient cannot afford GLP-1 therapy. We reviewed diet. Overall, pretty good, but could cut out some fried foods. She is due  for a steroid spinal injection today. Hasn't been walking due to pain. Intends to do so once her injection begins to work. Encouraged her to look into water therapy at [facility].  We also discussed strength exercises as well. If insurance coverage changes, would be happy to meet with patient again.   Dhana Totton D Breylon Sherrow, Pharm.JONETTA SARAN, CPP [facility] [address] [place], [place] [address]  Phone: [phone]; Fax: [phone]

## 2024-02-18 DIAGNOSIS — Z23 Encounter for immunization: Secondary | ICD-10-CM | POA: Diagnosis not present

## 2024-03-01 ENCOUNTER — Other Ambulatory Visit: Payer: Self-pay | Admitting: Cardiology

## 2024-03-01 DIAGNOSIS — I10 Essential (primary) hypertension: Secondary | ICD-10-CM

## 2024-04-09 ENCOUNTER — Other Ambulatory Visit: Payer: Self-pay

## 2024-04-09 DIAGNOSIS — I480 Paroxysmal atrial fibrillation: Secondary | ICD-10-CM

## 2024-04-11 ENCOUNTER — Telehealth: Payer: Self-pay | Admitting: Cardiology

## 2024-04-11 DIAGNOSIS — Z1231 Encounter for screening mammogram for malignant neoplasm of breast: Secondary | ICD-10-CM | POA: Diagnosis not present

## 2024-04-11 DIAGNOSIS — Z01419 Encounter for gynecological examination (general) (routine) without abnormal findings: Secondary | ICD-10-CM | POA: Diagnosis not present

## 2024-04-11 DIAGNOSIS — Z6841 Body Mass Index (BMI) 40.0 and over, adult: Secondary | ICD-10-CM | POA: Diagnosis not present

## 2024-04-11 NOTE — Telephone Encounter (Signed)
 Pt had discussed weight loss shots with the Dr and would like the nurse to return call to see if that is possible please advise

## 2024-04-11 NOTE — Telephone Encounter (Signed)
 Spoke to patient she stated she saw PharmD recently and was told her insurance would not cover GLP-1's.Stated she has decided she would like to take and pay cash.Advised I will send message to Pharm D.

## 2024-04-12 MED ORDER — FLECAINIDE ACETATE 100 MG PO TABS: 100.0000 mg | ORAL_TABLET | Freq: Two times a day (BID) | ORAL | 2 refills | Status: AC

## 2024-04-17 MED ORDER — WEGOVY 0.25 MG/0.5ML ~~LOC~~ SOAJ: 0.2500 mg | SUBCUTANEOUS | 0 refills | Status: DC

## 2024-04-17 NOTE — Telephone Encounter (Signed)
 I spoke to patient and reviewed the difference between Wegovy  and Zepbound.  Also reviewed the different cost for cash pay.  Will proceed with Wegovy  since it is less expensive at this current time.  Rx sent to [facility] pharmacy.  Patient aware that they will reach out to help her create an account to set up payment and shipment.  Patient to call me in 4 weeks to discuss next dose.

## 2024-05-05 ENCOUNTER — Other Ambulatory Visit: Payer: Self-pay | Admitting: Cardiology

## 2024-05-30 ENCOUNTER — Telehealth: Payer: Self-pay | Admitting: Cardiology

## 2024-05-30 MED ORDER — WEGOVY 0.25 MG/0.5ML ~~LOC~~ SOAJ: 0.2500 mg | SUBCUTANEOUS | 0 refills | Status: DC

## 2024-05-30 NOTE — Telephone Encounter (Signed)
 Lost 8 lbs. Still experiencing hard cramps at the current dose. Will repeat 0.25 mg injection for one more month. Patient to call us  in 4 weeks for further dose titration.

## 2024-05-30 NOTE — Telephone Encounter (Signed)
 Pt c/o medication issue:  1. Name of Medication:  Wegovy   2. How are you currently taking this medication (dosage and times per day)?   3. Are you having a reaction (difficulty breathing--STAT)?   4. What is your medication issue?   Patient would like to know if she can be switched to taking a tablet version of Wegovy . Please advise.

## 2024-06-01 ENCOUNTER — Telehealth: Payer: Self-pay | Admitting: Cardiology

## 2024-06-01 NOTE — Telephone Encounter (Signed)
 Patient says she received a text from [facility] that they are waiting on an Rx # to distribute the Wegovy . Patient is requesting that # if possible. She says she has made several unsuccessful attempts with every number she could find.

## 2024-06-04 NOTE — Telephone Encounter (Signed)
 Pt states that she is having trouble receiving her prescription and the pharmacy is stating for us  to reach out to them. Pt states she missed her Saturday dose. Also has questions regarding her further refills or what she needs to do in the future to avoid this. Informed pt I will reach out to pharmacy team to further assist.

## 2024-06-04 NOTE — Telephone Encounter (Signed)
 Pt is calling again to check the status of this message. Please advise.

## 2024-06-07 ENCOUNTER — Other Ambulatory Visit: Payer: Self-pay | Admitting: Cardiology

## 2024-06-07 MED ORDER — WEGOVY 0.25 MG/0.5ML ~~LOC~~ SOAJ: 0.2500 mg | SUBCUTANEOUS | 0 refills | Status: AC

## 2024-06-07 NOTE — Telephone Encounter (Signed)
 Will bypass NovoCares for this month and use goodrx coupon. Info given to patient and put in rx. My direct # was given to patient and I asked her to call me if there was issues  BIN [id number] PCN [id number] Group [id number] Member ID [id number]

## 2024-06-07 NOTE — Telephone Encounter (Signed)
 Prescription refill request for Xarelto  received.  Indication: AFLUTTER Last office visit: 12/2023 Weight: 116.1KG Age: 74 Scr: .088 (12/28/23) CrCl: 103
